# Patient Record
Sex: Female | Born: 1955
Health system: Southern US, Community
[De-identification: ages and names within clinical notes are randomized; demographics above are authoritative.]

## PROBLEM LIST (undated history)

## (undated) DIAGNOSIS — T4145XA Adverse effect of unspecified anesthetic, initial encounter: Secondary | ICD-10-CM

## (undated) DIAGNOSIS — K219 Gastro-esophageal reflux disease without esophagitis: Secondary | ICD-10-CM

## (undated) DIAGNOSIS — L409 Psoriasis, unspecified: Secondary | ICD-10-CM

## (undated) DIAGNOSIS — F329 Major depressive disorder, single episode, unspecified: Secondary | ICD-10-CM

## (undated) DIAGNOSIS — F32A Depression, unspecified: Secondary | ICD-10-CM

## (undated) DIAGNOSIS — IMO0001 Reserved for inherently not codable concepts without codable children: Secondary | ICD-10-CM

## (undated) DIAGNOSIS — K5792 Diverticulitis of intestine, part unspecified, without perforation or abscess without bleeding: Secondary | ICD-10-CM

## (undated) DIAGNOSIS — T8859XA Other complications of anesthesia, initial encounter: Secondary | ICD-10-CM

## (undated) DIAGNOSIS — I10 Essential (primary) hypertension: Secondary | ICD-10-CM

## (undated) DIAGNOSIS — E119 Type 2 diabetes mellitus without complications: Secondary | ICD-10-CM

## (undated) HISTORY — DX: Type 2 diabetes mellitus without complications: E11.9

## (undated) HISTORY — DX: Psoriasis, unspecified: L40.9

## (undated) HISTORY — DX: Major depressive disorder, single episode, unspecified: F32.9

## (undated) HISTORY — PX: COLON SURGERY: SHX602

## (undated) HISTORY — PX: TUBAL LIGATION: SHX77

## (undated) HISTORY — DX: Depression, unspecified: F32.A

---

## 1998-06-12 ENCOUNTER — Ambulatory Visit (HOSPITAL_COMMUNITY): Admission: RE | Admit: 1998-06-12 | Discharge: 1998-06-12 | Payer: Self-pay | Admitting: Obstetrics and Gynecology

## 1999-05-15 ENCOUNTER — Ambulatory Visit (HOSPITAL_COMMUNITY): Admission: RE | Admit: 1999-05-15 | Discharge: 1999-05-15 | Payer: Self-pay | Admitting: Obstetrics and Gynecology

## 1999-05-15 ENCOUNTER — Encounter: Payer: Self-pay | Admitting: Obstetrics and Gynecology

## 1999-05-24 ENCOUNTER — Other Ambulatory Visit: Admission: RE | Admit: 1999-05-24 | Discharge: 1999-05-24 | Payer: Self-pay | Admitting: Obstetrics and Gynecology

## 2000-06-04 ENCOUNTER — Other Ambulatory Visit: Admission: RE | Admit: 2000-06-04 | Discharge: 2000-06-04 | Payer: Self-pay | Admitting: Obstetrics and Gynecology

## 2001-03-30 ENCOUNTER — Encounter: Admission: RE | Admit: 2001-03-30 | Discharge: 2001-03-30 | Payer: Self-pay | Admitting: Family Medicine

## 2001-03-30 ENCOUNTER — Encounter: Payer: Self-pay | Admitting: Family Medicine

## 2001-07-28 ENCOUNTER — Other Ambulatory Visit: Admission: RE | Admit: 2001-07-28 | Discharge: 2001-07-28 | Payer: Self-pay | Admitting: Obstetrics and Gynecology

## 2002-10-05 ENCOUNTER — Other Ambulatory Visit: Admission: RE | Admit: 2002-10-05 | Discharge: 2002-10-05 | Payer: Self-pay | Admitting: *Deleted

## 2003-10-24 ENCOUNTER — Other Ambulatory Visit: Admission: RE | Admit: 2003-10-24 | Discharge: 2003-10-24 | Payer: Self-pay | Admitting: Obstetrics and Gynecology

## 2003-12-08 ENCOUNTER — Encounter: Admission: RE | Admit: 2003-12-08 | Discharge: 2003-12-08 | Payer: Self-pay | Admitting: Emergency Medicine

## 2004-10-30 ENCOUNTER — Other Ambulatory Visit: Admission: RE | Admit: 2004-10-30 | Discharge: 2004-10-30 | Payer: Self-pay | Admitting: Obstetrics and Gynecology

## 2005-04-29 ENCOUNTER — Other Ambulatory Visit: Admission: RE | Admit: 2005-04-29 | Discharge: 2005-04-29 | Payer: Self-pay | Admitting: Obstetrics and Gynecology

## 2005-11-14 ENCOUNTER — Other Ambulatory Visit: Admission: RE | Admit: 2005-11-14 | Discharge: 2005-11-14 | Payer: Self-pay | Admitting: Obstetrics and Gynecology

## 2006-08-27 ENCOUNTER — Encounter: Admission: RE | Admit: 2006-08-27 | Discharge: 2006-08-27 | Payer: Self-pay | Admitting: Emergency Medicine

## 2006-12-12 ENCOUNTER — Encounter: Admission: RE | Admit: 2006-12-12 | Discharge: 2006-12-12 | Payer: Self-pay | Admitting: Emergency Medicine

## 2006-12-17 ENCOUNTER — Encounter: Admission: RE | Admit: 2006-12-17 | Discharge: 2006-12-17 | Payer: Self-pay | Admitting: Emergency Medicine

## 2007-05-12 ENCOUNTER — Encounter: Admission: RE | Admit: 2007-05-12 | Discharge: 2007-05-12 | Payer: Self-pay | Admitting: Gastroenterology

## 2007-11-26 HISTORY — PX: BOWEL RESECTION: SHX1257

## 2008-03-31 ENCOUNTER — Encounter: Admission: RE | Admit: 2008-03-31 | Discharge: 2008-03-31 | Payer: Self-pay | Admitting: Emergency Medicine

## 2008-03-31 ENCOUNTER — Inpatient Hospital Stay (HOSPITAL_COMMUNITY): Admission: EM | Admit: 2008-03-31 | Discharge: 2008-04-05 | Payer: Self-pay | Admitting: Emergency Medicine

## 2008-04-11 ENCOUNTER — Inpatient Hospital Stay (HOSPITAL_COMMUNITY): Admission: EM | Admit: 2008-04-11 | Discharge: 2008-04-28 | Payer: Self-pay | Admitting: Emergency Medicine

## 2008-04-19 ENCOUNTER — Encounter (INDEPENDENT_AMBULATORY_CARE_PROVIDER_SITE_OTHER): Payer: Self-pay | Admitting: General Surgery

## 2010-12-15 ENCOUNTER — Encounter: Payer: Self-pay | Admitting: Emergency Medicine

## 2011-04-09 NOTE — H&P (Signed)
[name], EASTERWOOD NO.:  [id number]   MEDICAL RECORD NO.:  [id number]          PATIENT TYPE:  EMS   LOCATION:  ED                           FACILITY:  [facility]   PHYSICIAN:  Vladimir Faster, MD        DATE OF BIRTH:  1956/07/06   DATE OF ADMISSION:  03/31/2008  DATE OF DISCHARGE:                              HISTORY & PHYSICAL   PRIMARY CARE PHYSICIAN:  Harden Mo, M.D.   CHIEF COMPLAINTS:  Abdominal pain.   Ms. Caitlyn [name] is a 56 year old lady who has a history of hypertension, asthma  and depression who was sent to the ED by her primary care doctor with a  perforated sigmoid diverticulitis.  She states she started having lower  abdominal pain yesterday.  She also experienced some upper abdominal  pain too. The pain was constant in nature.  There is no radiation.  She  describes the pain as a crampy pain. This afternoon she went to her  primary care doctor's office who sent for labs and also did a CT scan of  the abdomen which showed that she had a perforated sigmoid  diverticulitis.  She states since this afternoon she also has some  watery diarrhea.  She also complains of fever and chills since  yesterday. She states she had similar episode approximately 8 months  ago, but at that time the pain went away spontaneously after a couple of  days.  At that time, she did not go to a doctor.  She states now her  abdomen hurts when she moves, but it is localized in her left lower  quadrant.   PAST MEDICAL HISTORY:  1. Asthma.  2. Hypertension.  3. Depression.   PAST SURGICAL HISTORY:  She had tubal ligation the past.  She states her  post surgical period was complicated by respiratory failure and she  remained intubated for 5-6 days.  She apparently had fluid overload and  was unable to be weaned off the ventilator.   ALLERGIES:  She is allergic to ERYTHROMYCIN.   CURRENT MEDICATIONS:  1. Singular 10 mg daily.  2. Venlafaxine 150 mg daily.  3. Fexofenadine 180  mg daily.  4. Naprosyn 500 mg as needed.  5. Advair discus 50/100 1 puff twice daily.  She tells me that she      does not take the Advair regularly.   SOCIAL HISTORY:  She lives at home. Denies any history of smoking,  alcohol or drug abuse.   FAMILY HISTORY:  Her dad is 65.  Her mother is 44, both of them healthy.   REVIEW OF SYSTEMS:  GENERAL:  She denies any recent weight loss or  weight gain.  She does complain of fever and chills since yesterday.  HEENT: No headaches, no blurred vision.  No sore throat.  CARDIOVASCULAR:  Denies chest pain, palpitations.  RESPIRATORY:  Shortness of cough.  GI: She does have abdominal pain, but denies any  nausea or vomiting and she does complain of  diarrhea.  EXTREMITIES:  No  tingling numbness of  toes or fingers.   PHYSICAL EXAMINATION:  She is alert and oriented x3.  VITAL SIGNS: Temperature 97.3, pulse rate of 101 per minute, blood  pressure is 152/88, respiratory rate 16, oxygen saturation is 97% on  room air.  HEENT: Atraumatic, normocephalic.  Pupils bilaterally reactive to light.  Mucous membranes are moist.  NECK:  Supple.  No carotid bruits.  CARDIOVASCULAR: S1, S2 heard. Regular rate and rhythm.  CHEST:  Clear to auscultation.  ABDOMEN:  There is a left lower quadrant tenderness.  There is no  definite rebound tenderness.  Bowel sounds are decreased.   LABORATORY DATA:  Her labs were done as an outpatient which was sent by  her primary care doctor.  This show white count of 137 with 84+  neutrophils, hemoglobin of 12, platelets 274.  Sodium 140, potassium  4.54, chloride 104, bicarb 32, BUN 15, creatinine 0.74, glucose 98.  She  had a CT of the abdomen which shows free intraperitoneal air, sigmoid  diverticulitis with perforation.   IMPRESSION:  1. Perforated sigmoid diverticulitis.  2. Leukocytosis.  3. Sinus tachycardia.  4. History of asthma.   PLAN:  1. Perforated sigmoid diverticulitis.  The ED has contacted the       surgeon on call. Dr. Lucia Gaskins will see her in the ED. At this time,      the plan is for the medical service to admit her and surgery will      follow as a consult.  We will admit her to a step-down bed.  I will      start her on IV fluids and I will start on some narcotics for pain      and will start her empirically on Zosyn.  I will also get blood      cultures.  Dr. Lucia Gaskins will decide about the surgical management,      which I will keep her n.p.o. until she is seen by surgery.  2. She does have a mild leukocytosis and she has mild sinus      tachycardia.  I will also get a 12-lead EKG on her.  3. I will put her on DVT prophylaxis at this time.      Vladimir Faster, MD  Electronically Signed     PKN/MEDQ  D:  03/31/2008  T:  03/31/2008  Job:  [id number]

## 2011-04-09 NOTE — Consult Note (Signed)
Caitlyn [name], Caitlyn [name] NO.:  [id number]   MEDICAL RECORD NO.:  [id number]          PATIENT TYPE:  INP   LOCATION:  0105                         FACILITY:  [facility]   PHYSICIAN:  Fenton Malling. Lucia Gaskins, M.D.  DATE OF BIRTH:  1956/07/09   DATE OF CONSULTATION:  03/31/2008  DATE OF DISCHARGE:                                 CONSULTATION   Date of consultation ???   REFERRING PHYSICIAN:  Harden Mo, M.D.   REASON FOR CONSULTATION:  Diverticular disease.   HISTORY OF PRESENT ILLNESS:  This is a 55 year old white female, who  sees Dr. Philipp Deputy as her primary medical doctor, has been seen by  Dr. Elpidio Anis from [facility] in the [facility] Emergency Room.  She has had some crampy abdominal pain on and off for years.  Her most  recent severe attack was about eight months ago.  She used some  magnesium citrate where this resolved, and she blamed this on irritable  bowel syndrome.   However, yesterday, she started having abdominal pain, which got worse  during the evening.  She tried taking some Advil, which really did not  relieve the pain.  She went to see Dr. Philipp Deputy this morning.  He  obtained a CT scan through  [facility].  The CT scan shows pneumoperitoneum  with evidence of diverticulitis.  She was then sent to the [facility]  Emergency Room for further treatment and evaluation.   She has no history of peptic ulcer disease, liver disease, gallbladder  disease, pancreatic disease, or colon disease.  She underwent a negative  colonoscopy by Dr. Earlie Raveling in May of 2008.  She did not remember him  mentioning anything about diverticular disease.  It is after hours right  now so I cannot speak with Dr. Earlean Shawl.   Her only prior abdominal surgery was a tubal ligation in approximately  1989.   ALLERGIES:  SHE IS ALLERGIC TO ERYTHROMYCIN, which caused stomach upset.   CURRENT MEDICATIONS:  Singulair, Advair Disc, Naprosyn, Allegra.  She  was on Effexor, but  she says her medicine has been switched.  She is  unclear or unsure of her current medication.   REVIEW OF SYSTEMS:  NEUROLOGIC:  She has never had a seizure or loss of  consciousness.  PSYCHIATRIC:  She has had some history with depression and is seeing Dr.  Lynder Parents.  He is the one who has got her on the antidepressants.  PULMONARY:  She has adult asthma, which is pretty much year round.  She  sees Dr. Donneta Romberg for her allergies/asthma, which is under reasonably good  control.  She does not smoke cigarettes.  She has had no pneumonia or  tuberculosis.  CARDIAC:  She had hypertension but successfully lost about 60 pounds and  she says she is now off her blood pressure medicines with her blood  pressure behaving much better.  She has never had heart catheterization,  chest pain, angina.  GASTROINTESTINAL:  See history of present illness.  UROLOGIC:  No evidence of kidney stones or  kidney infections.  GYN:  She  has two boys and again had a tubal ligation in 1989.  EXTREMITIES:  She fell and broke her left arm, which is now healed, but  she is taking some Naprosyn for this left arm pain.   She sees Dr. Janine Ores from a GYN standpoint but has no GYN issues.   Her husband is at her bedside.  She works in Press photographer for [employer].   PHYSICAL EXAMINATION:  VITAL SIGNS:  Her temperature is 97.3, pulse of  107, blood pressure of 152/88, respirations 16.  HEENT:  Unremarkable.  NECK:  Supple.  I felt no masses or thyromegaly.  LUNGS:  Clear to auscultation with symmetric breath sounds.  I hear no  wheezing.  HEART:  Regular rate and rhythm.  The heart is tachycardic, but she has  no murmur and no rub.  ABDOMEN:  She has some mild rebound.  She has active bowel sounds.  Worse in her rebound, some tenderness is worse in the lower part of her  abdomen.  I did not do a rectal exam on her.  EXTREMITIES:  She has good strength in the upper and lower extremities,  though she has a  sore left upper arm.  NEUROLOGICAL:  She is grossly intact.   Labs that I have were drawn through [employer] are dated today, Mar 31, 2008, a white blood count of 13,700 with 86% neutrophils, a hemoglobin  of 12, hematocrit of 34.  Her glucose is 98, BUN is 15, creatinine of  0.74, sodium is 142, potassium 4.5.  Her total bilirubin is high at 1.5,  her alk-phos is 43, her amylase is 31, lipase 9.   I reviewed her CT with Misty Stanley, which shows what appears to be  diverticular changes of her sigmoid colon and free air.  She actually  has surprisingly little contamination, no evidence of abscess, and no  other cause for pneumoperitoneum.   DIAGNOSES:  1. Probable sigmoid diverticulitis with focal perforation.  Agree with      current plans of intravenous fluids, NPO, intravenous antibiotics,      and hopefully this will resolve with this medical course.  I have talked with her and her husband that as this gets better then we  have to decide on an elective basis does she need an elective sigmoid  colectomy and I think this will depend on her course.  If she does not  get better over the next 48 to 72 hours, I told her there is a chance  she would require more urgent surgery and would almost certainly need a  temporary colostomy if this were to happen.  I think they understand  both the risks and benefits of both waiting and more urgent surgery.  1. Asthma, which right now is pretty well controlled.  2. Depression.  3. History of weight loss of 60 pounds, though she is still moderately      obese.  4. History of hypertension, though off medicines at this current time.  5. History of fracture of left arm, which is still sore but seems to      be getting better.      Fenton Malling. Lucia Gaskins, M.D.  Electronically Signed     DHN/MEDQ  D:  03/31/2008  T:  03/31/2008  Job:  [id number]   cc:   Harden Mo, M.D.  Fax: [phone]   Vladimir Faster, MD  Email: [email or web address]  Richard  M. Matthew Saras, M.D.  Fax: [phone]   Mayme Genta, M.D.  Fax: [phone]   Dondra Spry, M.D.  Fax: [phone]

## 2011-04-09 NOTE — H&P (Signed)
NAMETASHAWN, GREFF NO.:  [id number]   MEDICAL RECORD NO.:  [id number]          PATIENT TYPE:  INP   LOCATION:  1536                         FACILITY:  [facility]   PHYSICIAN:  Orson Ape. Weatherly, M.D.DATE OF BIRTH:  06/26/1956   DATE OF ADMISSION:  04/11/2008  DATE OF DISCHARGE:                              HISTORY & PHYSICAL   CHIEF COMPLAINT:  Increasing lower abdominal pain.   HISTORY:  The patient is a 55 year old female patient of Harden Mo, M.D., who was admitted here under the [facility] on Mar 31, 2008, with lower abdominal pain after a CT had been obtained by Dr.  Jake Michaelis that showed a perforated sigmoid diverticulitis.  She, at that  time, had started having pain about 24 hours before and was admitted and  placed on Zosyn.  She was seen in surgical consultation by Fenton Malling.  Lucia Gaskins, M.D. on May 7, and clinically improved significantly over a  period of about 48 hours and was discharged on Tuesday on oral  amoxicillin.  She said over the next 4-5 days she started having pain  and fever over the weekend, but not that severe.  Today she returned to  see Dr. Jake Michaelis and on examination said she was about the same as she was  when he saw her on May 7, and he called and I suggested that she be sent  back to the ER at [facility].  On examination in the  emergency room, she was definitely not generalized tender.  She was  tender in the lower abdomen and on the rectal examination there was  stool within her rectum.  I could not feel anything on rectal  examination that feels like a definite abscess.  I reviewed the x-rays  from 10 days ago with the radiologist and we elected to get an abdominal  series and this did not show any evidence of any free air, kind of a  nonspecific bowel gas pattern.  Then a CT was ordered.  It was not keyed  in when she was in the emergency room and when I returned about 3 hours  later to review the CT,  it was discovered that it had not been ordered.  She has now had the oral contrast and she has just completed the CT at  about midnight.  The CT at this time now shows that there is about an  orange-size abscess that is kind of anterior to the bladder.  There is  this segment of sigmoid diverticulitis, but there is no evidence of this  diffuse free air that was present on the CT on May 7.  I reviewed the x-  ray with the radiologist and he feels that this is amenable to a  percutaneous drainage and I think that what we will do is reassess her  in the morning and repeat her white count.  She has been started on  Zosyn and Flagyl and if she is clinically improving, we will proceed  with percutaneous drainage tomorrow.  If she is having  basically  deterioration in her clinical course or markedly increased white count,  etc., then the patient is aware that we possibly will need to proceed  with a laparotomy, drainage, sigmoid colectomy, and end colostomy and  then reestablish bowel continuity approximately 4 months later.  The  patient is hopeful that she is not going to need a colostomy but it was  explained to her when she was in the hospital previously and she is more  receptive than I think she was during her admission on May 7.   PAST MEDICAL HISTORY:  Well documented.  Previously she has had a  history of depression and has been restarted on the medications for  this.  Her psychiatrist is Lynder Parents, M.D. and her other past history  is that she has had a history of asthma for which she is on inhalers.  She is not wheezing at this time.  Dondra Spry, M.D. is her  allergy/asthma physician and she appears to be under good control.  She  has had a colonoscopy with Mayme Genta, M.D. approximately in 2008  and no mention was made to her about diverticulitis at the time of that  colonoscopy.   CHRONIC MEDICATIONS:  1. Singulair 10 mg daily.  2. Advair Diskus twice daily.  3.  Naprosyn.  4. Allegra.  5. Venlafaxine.   The patient has had a tubal ligation in 1989 and she has had fracture of  her left arm in the past.  Ralene Bathe. Matthew Saras, M.D. is her gynecologist.  Please refer to the detailed recent physical examination for any  details.   PHYSICAL EXAMINATION:  VITAL SIGNS:  Today in the emergency room her  temperature was 99.4, later it was 99.7, pulse originally was 120 but  after IV started it was 84, respirations 20, blood pressure 136/78.  HEENT:  Unremarkable.  LUNGS:  Clear.  She is not wheezing.  I did not do a breast examination.  HEART:  Normal sinus rhythm.  ABDOMEN:  She is certainly not acutely tender.  She has a pressure sore  in the mid lower pelvis area.  RECTAL:  There was some solid stool in her rectum, but I could not feel  anything like an actual abscess and movement of the cervix was not that  uncomfortable in the emergency room on rectal examination.   LABORATORY DATA:  Today in the ER her white count now is 18,300 with a  left shift, 89% neutrophils.  Her serum electrolytes are normal with BUN  10, glucose 97.  Liver function tests are normal.   She has been started on Zosyn and Flagyl and lab studies of CBC and coag  studies were ordered for in the morning.  Review this with the  interventional radiologist in the morning providing she is not having  any signs of generalized peritonitis.   ADMISSION DIAGNOSES:  1. Pelvic abscess secondary to perforated sigmoid diverticulitis.  2. History of mild hypertension.  3. History of asthma.           ______________________________  Orson Ape. Rise Patience, M.D.     WJW/MEDQ  D:  04/12/2008  T:  04/12/2008  Job:  [id number]

## 2011-04-09 NOTE — Discharge Summary (Signed)
NAMECAYLIN, Caitlyn [name]                   ACCOUNT NO.:  [id number]   MEDICAL RECORD NO.:  [id number]          PATIENT TYPE:  INP   LOCATION:  35                         [facility]ACILITY:  [facility]   PHYSICIAN:  Mobolaji B. Bakare, M.D.DATE OF BIRTH:  Dec 20, 1955   DATE OF ADMISSION:  03/31/2008  DATE OF DISCHARGE:  04/05/2008                               DISCHARGE SUMMARY   PRIMARY CARE PHYSICIAN:  Harden Mo, M.D.   CONSULTATION:  General surgery consult provided by Dr. Alphonsa Overall.   FINAL DIAGNOSES:  1. Sigmoid diverticulitis with perforation.  2. Normocytic anemia.  3. Hypertension.  4. History of asthma, quiescent during this hospitalization.   PROCEDURE:  CT scan of abdomen and pelvis done on Mar 31, 2008 showed  sigmoid diverticulitis with perforation.  No overt abscess formation is  seen.  Mild secondary small bowel inflammatory changes identified  without evidence for obstruction.  Consider evaluation for possible  underlying mass lesion after resolution of acute episode.   BRIEF HISTORY:  Please refer to the admission H&P dictated by Dr. Carlton Adam.  In brief, Caitlyn [name] is a 55 year old Caucasian female with history of  hypertension, asthma, and depression.  She developed abdominal pain the  day prior to hospitalization.  She went to see Dr. Jake Michaelis in the office.  Given the nature of this pain, he requested for urgent CT scan of the  abdomen and pelvis as an outpatient.  Result confirmed sigmoid  diverticulitis with perforation without any abscess.  She had mild  leukocytosis and tachycardia.  The patient was admitted to medical  service after discussion with Dr. Lucia Gaskins (surgeon).  He opted for  conservative treatment given the fact that there is no abscess at this  time.   Upon admission, temperature was 97.3, heart rate of 101.  She was  started on IV Zosyn and admitted to in the intensive care unit and  monitored closely.   HOSPITAL COURSE:  Problem 1.  Acute sigmoid  diverticulitis with  perforation.  The patient responded to n.p.o., IV fluids, IV  antibiotics, and bowel rest.  The bowel was rested for about 4 days.  The patient improved remarkably well, and she remained afebrile, white  cell count normalized, and the abdomen became less tender.  She was  therefore started on clear liquids which she tolerated quite well.  Abdominal pain and tenderness has completely resolved.  She has  completed 5 days of IV antibiotics in the hospital.  She will continue  with p.o. Augmentin as outpatient for 10 more days.  The patient will  follow up with Dr. Lucia Gaskins in 2-3 weeks.  She has been counseled on diet.  Low residue diet instruction has been given to the patient.   Problem 2.  Hypertension.  She has history of hypertension but was taken  off antihypertensive because her blood pressure was normal and actually  borderline after she lost about 60 pounds.  However, the patient  contends that the blood pressure has been climbing up recently.  During  the course of hospitalization, her blood pressure ranged between 150-170  systolic.  Therefore, we had to start her on clonidine patch while  n.p.o.  Upon discharge, she was given Diovan 80 mg daily, further  adjustment to be made by Dr. Jake Michaelis on outpatient basis.  The patient  was previously on Diovan.   DISCHARGE MEDICATIONS:  1. Augmentin 500 mg p.o. b.i.d. for 10 more days.  2. Singulair 10 mg daily.  3. Venlafaxine 150 mg daily.  4. Fexofenadine 180 mg p.o. daily.  5. Advair 50/100 one b.i.d.  6. Diovan 80 mg daily.  7. Nu-Iron 150 mg p.o. daily.  8. Naproxen has been discontinued.   DIET:  The patient is currently on a clear liquid diet.  She should  start low residue diet tomorrow.   DISCHARGE INSTRUCTIONS:  1. Follow up [facility] in 2 weeks (ARB started).  2. Follow up with Dr. Jake Michaelis in a 5 days.  3. Follow up with Dr. Lucia Gaskins in 2-3 weeks.  4. The patient to have colonoscopy done as an outpatient  when acute      inflammation resolves.   DISCHARGE LABORATORY DATA:  Blood culture with no growth.  White cell  5.8, hemoglobin 9.9, hematocrit 29.2, platelets 179.  Anemia panel  showed iron less than 10, vitamin B12 379, folate 12.7, ferritin 463,  TIBC and percent saturation not calculated.      Mobolaji B. Maia Petties, M.D.  Electronically Signed     MBB/MEDQ  D:  04/05/2008  T:  04/05/2008  Job:  [id number]

## 2011-04-09 NOTE — Op Note (Signed)
[name], [name] NO.:  [id number]   MEDICAL RECORD NO.:  [id number]          PATIENT TYPE:  INP   LOCATION:  1536                         FACILITY:  [facility]   PHYSICIAN:  Orson Ape. Weatherly, M.D.DATE OF BIRTH:  Nov 11, 1956   DATE OF PROCEDURE:  04/19/2008  DATE OF DISCHARGE:                               OPERATIVE REPORT   PREOPERATIVE DIAGNOSES:  Sigmoid diverticulitis with abscess drained 1  week ago.   OPERATION:  Exploratory laparotomy, resection of sigmoid colon, end-to-  end anastomosis, extensive lysis of adhesions.   ANESTHESIA:  General.   SURGEON:  Orson Ape. Rise Patience, M.D.   ASSISTANT:  Dr. Johnathan Hausen.   HISTORY:  Caitlyn [name] is a 55 year old Caucasian female who approximately  2-1/2 weeks ago was admitted on the [facility] service after being seen  by Dr. Philipp Deputy, her regular physician, with diverticulitis.  She  had an elevated white count.  The original CT showed a small amount of  free air.  She was started on antibiotics, did very nicely and then her  white count was elevated, dropped down to normal.  Dr. Lucia Gaskins was seeing  her in consultation and the possibility of emergency surgery was  entertained but she did so nicely it was felt not to be necessary.  She  got out of the hospital in about 4 days on oral antibiotics but when she  got home she started having fever, similar-type pains, and then was seen  again  by Dr. Philipp Deputy on Monday.  He said she had the same physical  findings and I was called and admitted her here at [facility].  Dr.  Lucia Gaskins was on vacation that day.  Her repeat CT showed obviously an  abscess anterior to the bladder.  You could see a segment of 8 inches or  so was sigmoid colon that was markedly sigmoid diverticulitis and  fortunately at that one we did not see any free air like had been seen  on the CT approximately 10 days earlier.  I  placed her on Zosyn and  Flagyl, talked with the radiologist  about doing a percutaneous drainage  of the abscess the following morning, and the patient was in agreement.  Dr. Lucia Gaskins did see her the following day, was in agreement with this and  we placed the percutaneous drain, got frank purulence.  It grows  multiple organisms all sensitive to the predominant  E. coli organism.  Clinically she had gotten better.  We have kept her n.p.o.  Then after  about 2 days, with her definitely improving, we started her on a liquid  diet, which she has tolerated and continued to have kind of more amount  of drainage than usual but not actually intestinal contents obviously.  Clinically she was improving such that we were thinking that we would do  the percutaneous drainage and get the catheter out, but with her  history, it was felt that she was definitely going to need a sigmoid  colectomy.  I obtained a CT on Saturday and her white count is  normal  now.  There is no evidence of any other abscesses.  There is a little  collection around the catheter but we did not see any incontinuity of  the bowel.  We arranged for a sinus tract injection the following  morning, which was done on Sunday, and this shows a definite  communication with the sigmoid colon.  Fortunately, it appears that she  has got a pretty good bowel prep and I recommended that we go ahead and  proceed on with a sigmoid colectomy.  Hopefully, we can do an  anastomosis now that she is prepped and the patient and her family are  in agreement with this.  Dr. Lucia Gaskins is also in agreement and today his  schedule did not permit him to assist but Dr. Hassell Done assisted and Dr.  Lucia Gaskins did drop in and out of the OR during the procedure.   DESCRIPTION OF PROCEDURE:  The patient was taken down.  The first thing  we did after induction anesthesia was put a Foley catheter within the  anus that was hooked up to a bulb of a sigmoid scope and inflated so  after we do the anastomosis, we could test it.  Next, the  Foley catheter  was inserted.  She had PAS stockings.  The abdomen was prepped with  Betadine solution and draped in a sterile manner.  A midline incision  was made below the umbilicus.  She is fairly moderately overweight and  down through the adipose tissue and then the fascia opened in the  midline and carefully opened into the peritoneal cavity.  You could feel  this inflammatory mass anterior to the bladder but there was not a  generalized peritonitis and fortunately there was a very good prep of  her colon.  The omentum was adherent to the area and this was kind of  carefully separated and then there was several loops of small bowel that  sort of made up a portion of the abscess cavity.  The bladder made the  most inferior portion and then the sigmoid colon, this was the area of  the kind of the loop of the sigmoid colon that was probably about 8 or 9  inches in length and then the proximal and distal colon looked good.  Fortunately, we able to divide the mesentery close to the bowel and did  not dissect out the left ureter, and then picked the area for the  anastomosis right below the descending colon and the little bit of the  distal sigmoid rectal junction, and this came together very nicely.  I  did a single layer of 3-0 silk knots inverted anastomosis and it came  together without excessive tension and it is a good lumen by finger  test.  Next, the little mesenteric defect were closed with 3-0 silk and  then we inflated the balloon that was in her rectum and the nurse  inflated the rectum and under water there was no evidence of any air  leak.  The colon distends up nicely.  We then deflated the area and kind  of freed up the little adhesions to the small bowel and there were  several little areas that were kind of within the abscess wall that I  carefully inspected and could not see any communication to truly of the  small bowel itself.  These areas, of course, now not located  in the  pelvis and I did a few 3-0 silk serosa-to-serosa if there was  any kind  of little serosal injury.  The omentum was brought down over this.  Her  gallbladder was distended but it was  not acutely inflamed, had a good  color, and I think this is just basically related to not eating for  approximately 2 weeks, and we had not noticed any stones on her CAT  scans.  Also, the gallbladder was way up and it would not be possible to  approach it from this incision without extending the incision about 6  inches.  Dr. Lucia Gaskins was there when we were inspecting the gallbladder  and we elected just to not worry about the gallbladder at this time and  I certainly could not palpate any stones.  The fascia, the lower  peritoneum was closed with a running 0 Vicryl, the anterior fascia and  full-thickness fascia with interrupted sutures of #1 Novofil.  I did  place a 19 Blake drain in the pelvis.  It is located really behind the  bladder, sort of below the anastomosis.  The small bowel was in  anatomical position and the fascia sutures were all closed.  I then  basically placed 3-0 nylon sutures in the skin but packed the wound  itself with Betadine gauze 4 x 4s and will do a delayed closure of the  skin probably in about 4-5 days since I think if we would close the skin  in a kind of an obese patient, that we will have a definite wound  infection.  Sponge and needle counts were correct.  Estimated blood loss  was probably 75 mL or so.  She has an NG tube that was positioned nicely  in her stomach and we will keep her n.p.o. IVs.  I am planning to start  her on TPN tomorrow since she has been about 2 weeks since she has a  PICC line in her arm at this time.           ______________________________  Orson Ape. Rise Patience, M.D.     WJW/MEDQ  D:  04/19/2008  T:  04/19/2008  Job:  [id number]   cc:   Harden Mo, M.D.  Fax: [phone]

## 2011-04-12 NOTE — Discharge Summary (Signed)
Caitlyn [name], Caitlyn [name] NO.:  [id number]   MEDICAL RECORD NO.:  [id number]          PATIENT TYPE:  INP   LOCATION:  1536                         FACILITY:  [facility]   PHYSICIAN:  Orson Ape. Weatherly, M.D.DATE OF BIRTH:  [birth year]   DATE OF ADMISSION:  04/11/2008  DATE OF DISCHARGE:  04/28/2008                               DISCHARGE SUMMARY   DISCHARGE DIAGNOSES:  Sigmoid diverticulitis with pelvic abscess.   OPERATION:  Percutaneous drainage of pelvic abscess, and later sigmoid  resection with primary anastomosis.  We also did a delayed closure of  the patient's wound.   HISTORY:  Caitlyn [name] is a 55 year old Caucasian female, patient of Dr.  Nicholes Mango who was admitted here by the [facility] service on Mar 31, 2008, after she presented with lower abdominal pain in Dr. Viann Shove  office and had a CAT scan that showed a perforated sigmoid  diverticulitis.  She had started having pain approximately 24 hours  prior to that and was admitted, placed on Zosyn and was seen in surgical  consultation by Dr. Lucia Gaskins on Mar 31, 2008.  She significantly improved  over 24 hours with IV antibiotics and was discharged on oral antibiotic  amoxicillin, and said over the next 4-5 days she started having pain,  fever and had similar symptoms to what she had when she was admitted on  the first time.  She saw Dr. Jake Michaelis on Monday morning in his office; he  called me, I was on-call at [facility], and said her symptoms were  basically the same and I readmitted her.  We repeated the CT.  On  physical examination she was tender, but not a generalized tenderness.  On rectal exam, I could not feel any obvious abscess.  On the repeat CT,  there was definitely an abscess with very minimal air, well localized.  The patient has a past history of depression and has been on medication.  Her physician is Dr. Lynder Parents.  She also has a history of asthma for  which she is on Singulair and Advair  inhaler b.i.d. Since Dr. Lucia Gaskins had  seen her previously, I had him also to see her, and we thought that even  though the abscess was fairly large that definitely a percutaneous  drainage was indicated.  This was done the following day with marked  purulence that showed multiple organisms.  And clinically with the  percutaneous drainage, her pain decreased and her white count that was  elevated initially at 18,300 dropped down to 4800, 48 hours later.  She  was started on a liquid diet.  At first, there was no obvious GI  contents in the abscess and she still had left lower quadrant pain, but  better.  Her temperature came down to normal, and then a follow-up CT  was scheduled.  She started having loose bowel movements and we had  followup CT fistulous tract, and this shows a definite communication  with the abscess to the colon.  Therefore, we had a patient that was  clinically improved, but obvious communication  who had been in the  hospital one time previously and with the amount of contrast going into  the abscess cavity, I talked with Dr. Lucia Gaskins, and we decided it would be  best to go ahead and try to prep her and hopefully be able to do a  primary anastomosis, but the patient was aware that she may need a  sigmoid colon resection and colostomy.  Therefore, she was scheduled and  taken to surgery.  The bowel had been prepped prior to surgery and Dr.  Hassell Done assisted on her surgery, which was done on Apr 19, 2008, and we  removed the markedly inflamed segment of sigmoid colon and did a primary  anastomosis.  I put a drain and I left the wound open in which we did a  delayed closure about 5 days later and kept her on continuous  antibiotics.  Postoperatively, she did nicely.  She had a nasogastric  tube, but started having bowel functions approximately 4 days after her  surgery.  We had started her on TPN preoperatively, which we continued.  Dr. Harlow Asa closed the sutures that I had  placed at the time of surgery  for delayed closure.  She did nicely and clinically improved when her  p.o. psychiatric medications were restarted.  She was ready for  discharged in improved condition without additional antibiotics on April 28, 2008.  She will be followed in our office for a wound check in  approximately 1 week, and hopefully will have no problems with  postoperative infection.  She was covered with Lovenox for  thrombophlebitis prevention during the hospitalization and her PICC line  was removed prior to her  release.  The patient has a few Vicodin if needed for pain medication,  but required very little pain medication at the time of her discharge.  She will continue on her usual pulmonary and asthma medication,  psychiatric medication, and will use MiraLax if needed for laxative  stimulus.           ______________________________  Orson Ape. Rise Patience, M.D.     WJW/MEDQ  D:  05/23/2008  T:  05/23/2008  Job:  [id number]   cc:   Harden Mo, M.D.  Fax: [phone]

## 2011-08-21 LAB — COMPREHENSIVE METABOLIC PANEL
ALT: 18
ALT: 56 — ABNORMAL HIGH
AST: 21
Albumin: 3.1 — ABNORMAL LOW
Alkaline Phosphatase: 45
BUN: 10
BUN: 10
CO2: 28
Calcium: 8.2 — ABNORMAL LOW
Calcium: 9.2
Chloride: 101
Creatinine, Ser: 0.77
GFR calc Af Amer: >60
GFR calc non Af Amer: >60
GFR calc non Af Amer: >60
Glucose, Bld: 126 — ABNORMAL HIGH
Total Bilirubin: 1.2
Total Protein: 5.6 — ABNORMAL LOW

## 2011-08-21 LAB — CLOSTRIDIUM DIFFICILE EIA

## 2011-08-21 LAB — BASIC METABOLIC PANEL
BUN: 8
BUN: 9
CO2: 30
CO2: 30
CO2: 31
Calcium: 8.4
Calcium: 8.5
Calcium: 8.6
Calcium: 8.6
Calcium: 8.7
Chloride: 102
Chloride: 104
Creatinine, Ser: 0.73
Creatinine, Ser: 0.78
GFR calc Af Amer: 51 — ABNORMAL LOW
GFR calc Af Amer: >60
GFR calc Af Amer: >60
GFR calc Af Amer: >60
GFR calc non Af Amer: 42 — ABNORMAL LOW
GFR calc non Af Amer: >60
GFR calc non Af Amer: >60
Glucose, Bld: 119 — ABNORMAL HIGH
Glucose, Bld: 167 — ABNORMAL HIGH
Potassium: 4.7
Sodium: 137
Sodium: 138
Sodium: 140

## 2011-08-21 LAB — CBC
HCT: 29.6 — ABNORMAL LOW
HCT: 31.2 — ABNORMAL LOW
HCT: 32.2 — ABNORMAL LOW
HCT: 34.1 — ABNORMAL LOW
HCT: 34.7 — ABNORMAL LOW
Hemoglobin: 10 — ABNORMAL LOW
Hemoglobin: 10.1 — ABNORMAL LOW
Hemoglobin: 10.6 — ABNORMAL LOW
Hemoglobin: 11.8 — ABNORMAL LOW
MCHC: 33
MCHC: 33.9
MCHC: 33.9
MCHC: 34
MCHC: 34.2
MCHC: 34.4
MCV: 87.7
MCV: 88.4
MCV: 88.8
Platelets: 340
Platelets: 345
Platelets: 376
RBC: 3.36 — ABNORMAL LOW
RBC: 3.69 — ABNORMAL LOW
RBC: 3.9
RBC: 3.92
RDW: 12.1
RDW: 12.8
RDW: 13.1
RDW: 13.4
RDW: 13.8
WBC: 15.3 — ABNORMAL HIGH
WBC: 18.3 — ABNORMAL HIGH

## 2011-08-21 LAB — DIFFERENTIAL
Basophils Absolute: 0
Basophils Relative: 0
Eosinophils Absolute: 0.5
Eosinophils Relative: 0
Lymphs Abs: 1.6
Monocytes Absolute: 0.8
Monocytes Relative: 7
Neutro Abs: 16.2 — ABNORMAL HIGH
Neutro Abs: 8.5 — ABNORMAL HIGH
Neutrophils Relative %: 74
Neutrophils Relative %: 89 — ABNORMAL HIGH

## 2011-08-21 LAB — PROTIME-INR
INR: 1.2
Prothrombin Time: 15.2

## 2011-08-21 LAB — CULTURE, ROUTINE-ABSCESS

## 2011-08-21 LAB — APTT: aPTT: 37

## 2011-08-21 LAB — ANAEROBIC CULTURE

## 2011-08-21 LAB — ABO/RH: ABO/RH(D): O NEG

## 2011-08-21 LAB — PHOSPHORUS: Phosphorus: 2.8

## 2011-08-21 LAB — MAGNESIUM: Magnesium: 2.1

## 2011-08-21 LAB — TYPE AND SCREEN: Antibody Screen: NEGATIVE

## 2011-08-21 LAB — PREALBUMIN: Prealbumin: 14.2 — ABNORMAL LOW

## 2011-08-22 LAB — BASIC METABOLIC PANEL
BUN: 25 — ABNORMAL HIGH
CO2: 26
Calcium: 8.6
Creatinine, Ser: 0.78
Creatinine, Ser: 0.9
GFR calc Af Amer: >60
GFR calc non Af Amer: >60

## 2011-08-22 LAB — PREALBUMIN: Prealbumin: 17.6 — ABNORMAL LOW

## 2011-08-22 LAB — CBC
Hemoglobin: 9.4 — ABNORMAL LOW
MCHC: 34.3
MCHC: 34.5
Platelets: 354
RBC: 3.12 — ABNORMAL LOW
RBC: 3.28 — ABNORMAL LOW
WBC: 6.3
WBC: 6.3

## 2011-08-22 LAB — COMPREHENSIVE METABOLIC PANEL
ALT: 13
AST: 15
Alkaline Phosphatase: 48
CO2: 30
Calcium: 8.8
GFR calc Af Amer: >60
GFR calc non Af Amer: >60
Glucose, Bld: 131 — ABNORMAL HIGH
Potassium: 4.2
Sodium: 139

## 2011-08-22 LAB — PHOSPHORUS: Phosphorus: 4.2

## 2011-08-22 LAB — DIFFERENTIAL
Basophils Relative: 1
Eosinophils Absolute: 0.3
Eosinophils Relative: 5
Lymphs Abs: 1.1
Monocytes Relative: 7

## 2011-08-22 LAB — MAGNESIUM: Magnesium: 2.1

## 2011-08-22 LAB — CHOLESTEROL, TOTAL: Cholesterol: 98

## 2011-08-22 LAB — TRIGLYCERIDES: Triglycerides: 103

## 2011-10-07 NOTE — H&P (Signed)
Caitlyn [name] [facility]  DICTATION # [id number] CSN# [id number]   Margarette Asal, MD 10/07/2011 9:52 AM

## 2011-10-08 ENCOUNTER — Encounter (HOSPITAL_BASED_OUTPATIENT_CLINIC_OR_DEPARTMENT_OTHER): Payer: Self-pay | Admitting: *Deleted

## 2011-10-08 NOTE — H&P (Signed)
Caitlyn [name], Caitlyn [name] NO.:  [id number]  MEDICAL RECORD NO.:  [id number]  LOCATION:                               FACILITY:  [facility]  PHYSICIAN:  Ralene Bathe. Matthew Saras, M.D.DATE OF BIRTH:  24-Mar-1956  DATE OF ADMISSION:  10/09/2011 DATE OF DISCHARGE:                             HISTORY & PHYSICAL   Outpatient surgery scheduled at [place]:  Postmenopausal bleeding.  HISTORY OF PRESENT ILLNESS:  A 55 year old postmenopausal patient who is noted to have some abnormal bleeding, SHG was carried out in our office dated September 24, 2011, that showed a retroverted uterus with a well- defined 2.3 x 7 mm polyp; presents now for D and C, hysteroscopy.  This procedure including risks related to bleeding, infection, and other complications such as perforation that may require additional surgery, all discussed with her, which she understands and accepts.  PAST MEDICAL HISTORY:  Last Pap smear dated August 12, was normal.  ALLERGIES:  ERYTHROMYCIN.  CURRENT MEDICATIONS: 1. Effexor. 2. Singulair.  SURGICAL HISTORY:  Two vaginal deliveries, tubal ligation, and partial colon resection in 2009.  REVIEW OF SYSTEMS:  Significant for history of asthma.  SOCIAL HISTORY:  Denies alcohol, tobacco, or drug use.  She is married. Dr. Dennard Schaumann in [place] is her PCP.  PHYSICAL EXAMINATION:  VITAL SIGNS:  Temperature 98.2, blood pressure 138/90. HEENT:  Unremarkable. NECK:  Supple without masses. LUNGS:  Clear. CARDIOVASCULAR:  Regular rate and rhythm without murmurs, rubs, or gallops. BREASTS:  Without masses. ABDOMEN:  Soft, flat, nontender. PELVIC EXAM:  Normal external genitalia.  Vagina and cervix clear. Uterus mid position, normal size.  Adnexa negative. EXTREMITIES:  Unremarkable. NEUROLOGIC EXAM:  Unremarkable.  IMPRESSION:  Postmenopausal bleeding, endometrial polyp.  PLAN:  D and C, hysteroscopy.  Procedure and risks reviewed as  above.     Giomar Gusler M. Matthew Saras, M.D.     RMH/MEDQ  D:  10/07/2011  T:  10/07/2011  Job:  [id number]

## 2011-10-08 NOTE — Progress Notes (Signed)
NPO after MN. Pt to arrive at [facility]. Needs istat and ekg and urine preg per dr Matthew Saras order. Will do advair inhaler am of surg. W/ sip of water. Will bring rescue inhaler.

## 2011-10-09 ENCOUNTER — Other Ambulatory Visit: Payer: Self-pay

## 2011-10-09 ENCOUNTER — Encounter (HOSPITAL_BASED_OUTPATIENT_CLINIC_OR_DEPARTMENT_OTHER): Payer: Self-pay | Admitting: Anesthesiology

## 2011-10-09 ENCOUNTER — Other Ambulatory Visit: Payer: Self-pay | Admitting: Obstetrics and Gynecology

## 2011-10-09 ENCOUNTER — Ambulatory Visit (HOSPITAL_BASED_OUTPATIENT_CLINIC_OR_DEPARTMENT_OTHER)
Admission: RE | Admit: 2011-10-09 | Discharge: 2011-10-09 | Disposition: A | Discharge status: Home / Self Care | Payer: 59 | Source: Ambulatory Visit | Attending: Obstetrics and Gynecology | Admitting: Obstetrics and Gynecology

## 2011-10-09 ENCOUNTER — Ambulatory Visit (HOSPITAL_BASED_OUTPATIENT_CLINIC_OR_DEPARTMENT_OTHER): Payer: 59 | Admitting: Anesthesiology

## 2011-10-09 ENCOUNTER — Encounter (HOSPITAL_BASED_OUTPATIENT_CLINIC_OR_DEPARTMENT_OTHER)
Admission: RE | Disposition: A | Discharge status: Home / Self Care | Payer: Self-pay | Source: Ambulatory Visit | Attending: Obstetrics and Gynecology

## 2011-10-09 DIAGNOSIS — N840 Polyp of corpus uteri: Secondary | ICD-10-CM | POA: Insufficient documentation

## 2011-10-09 DIAGNOSIS — N938 Other specified abnormal uterine and vaginal bleeding: Secondary | ICD-10-CM | POA: Insufficient documentation

## 2011-10-09 DIAGNOSIS — N949 Unspecified condition associated with female genital organs and menstrual cycle: Secondary | ICD-10-CM | POA: Insufficient documentation

## 2011-10-09 HISTORY — DX: Other complications of anesthesia, initial encounter: T88.59XA

## 2011-10-09 HISTORY — PX: HYSTEROSCOPY: SHX211

## 2011-10-09 HISTORY — DX: Adverse effect of unspecified anesthetic, initial encounter: T41.45XA

## 2011-10-09 HISTORY — DX: Gastro-esophageal reflux disease without esophagitis: K21.9

## 2011-10-09 HISTORY — DX: Diverticulitis of intestine, part unspecified, without perforation or abscess without bleeding: K57.92

## 2011-10-09 HISTORY — DX: Reserved for inherently not codable concepts without codable children: IMO0001

## 2011-10-09 HISTORY — DX: Essential (primary) hypertension: I10

## 2011-10-09 LAB — POCT I-STAT 4, (NA,K, GLUC, HGB,HCT)
Glucose, Bld: 146 mg/dL — ABNORMAL HIGH (ref 70–99)
HCT: 43 % (ref 36.0–46.0)
Hemoglobin: 14.6 g/dL (ref 12.0–15.0)
Potassium: 4.1 mEq/L (ref 3.5–5.1)
Sodium: 143 mEq/L (ref 135–145)

## 2011-10-09 SURGERY — HYSTEROSCOPY
Anesthesia: General | Site: Uterus | Wound class: Clean Contaminated

## 2011-10-09 MED ORDER — LIDOCAINE HCL 1 % IJ SOLN
INTRAMUSCULAR | Status: DC | PRN
  Administered 2011-10-09: 7 mL

## 2011-10-09 MED ORDER — MIDAZOLAM HCL 5 MG/5ML IJ SOLN
INTRAMUSCULAR | Status: DC | PRN
  Administered 2011-10-09: 2 mg via INTRAVENOUS

## 2011-10-09 MED ORDER — HYDROCODONE-ACETAMINOPHEN 5-325 MG PO TABS
1.0000 | ORAL_TABLET | Freq: Once | ORAL | Status: AC
  Administered 2011-10-09: 1 via ORAL

## 2011-10-09 MED ORDER — PROPOFOL 10 MG/ML IV EMUL
INTRAVENOUS | Status: DC | PRN
  Administered 2011-10-09: 200 mg via INTRAVENOUS

## 2011-10-09 MED ORDER — LIDOCAINE HCL (CARDIAC) 20 MG/ML IV SOLN
INTRAVENOUS | Status: DC | PRN
  Administered 2011-10-09: 40 mg via INTRAVENOUS
  Administered 2011-10-09: 60 mg via INTRAVENOUS

## 2011-10-09 MED ORDER — LACTATED RINGERS IV SOLN: INTRAVENOUS | Status: DC

## 2011-10-09 MED ORDER — FENTANYL CITRATE 0.05 MG/ML IJ SOLN
25.0000 ug | INTRAMUSCULAR | Status: DC | PRN
  Administered 2011-10-09: 25 ug via INTRAVENOUS

## 2011-10-09 MED ORDER — LACTATED RINGERS IV SOLN
INTRAVENOUS | Status: DC
  Administered 2011-10-09: 08:00:00 via INTRAVENOUS

## 2011-10-09 MED ORDER — FENTANYL CITRATE 0.05 MG/ML IJ SOLN
INTRAMUSCULAR | Status: DC | PRN
  Administered 2011-10-09 (×2): 50 ug via INTRAVENOUS

## 2011-10-09 MED ORDER — CEFAZOLIN SODIUM 1-5 GM-% IV SOLN
1.0000 g | INTRAVENOUS | Status: AC
  Administered 2011-10-09: 2 g via INTRAVENOUS

## 2011-10-09 MED ORDER — HYDROCODONE-IBUPROFEN 7.5-200 MG PO TABS: 1.0000 | ORAL_TABLET | Freq: Three times a day (TID) | ORAL | Status: AC | PRN

## 2011-10-09 MED ORDER — GLYCINE 1.5 % IR SOLN
Status: DC | PRN
  Administered 2011-10-09: 1

## 2011-10-09 MED ORDER — KETOROLAC TROMETHAMINE 30 MG/ML IJ SOLN
INTRAMUSCULAR | Status: DC | PRN
  Administered 2011-10-09: 30 mg via INTRAVENOUS

## 2011-10-09 MED ORDER — ONDANSETRON HCL 4 MG/2ML IJ SOLN
INTRAMUSCULAR | Status: DC | PRN
  Administered 2011-10-09: 4 mg via INTRAVENOUS

## 2011-10-09 SURGICAL SUPPLY — 31 items
CANISTER SUCTION 2500CC (MISCELLANEOUS) ×2 IMPLANT
CATH ROBINSON RED A/P 16FR (CATHETERS) ×1 IMPLANT
CLOTH BEACON ORANGE TIMEOUT ST (SAFETY) ×2 IMPLANT
CORD ACTIVE DISPOSABLE (ELECTRODE)
CORD ELECTRO ACTIVE DISP (ELECTRODE) IMPLANT
COVER TABLE BACK 60X90 (DRAPES) ×2 IMPLANT
DRAPE CAMERA CLOSED 9X96 (DRAPES) ×2 IMPLANT
DRAPE LG THREE QUARTER DISP (DRAPES) ×2 IMPLANT
DRESSING TELFA 8X3 (GAUZE/BANDAGES/DRESSINGS) ×2 IMPLANT
ELECT LOOP GYNE PRO 24FR (CUTTING LOOP)
ELECT REM PT RETURN 9FT ADLT (ELECTROSURGICAL) ×2
ELECT VAPORTRODE GRVD BAR (ELECTRODE) IMPLANT
ELECTRODE LOOP GYNE PRO 24FR (CUTTING LOOP) IMPLANT
ELECTRODE REM PT RTRN 9FT ADLT (ELECTROSURGICAL) ×1 IMPLANT
ELECTRODE ROLLER BARREL 22FR (ELECTROSURGICAL) IMPLANT
ELECTRODE VAPORCUT 22FR (ELECTROSURGICAL) IMPLANT
GLOVE BIO SURGEON STRL SZ7 (GLOVE) ×4 IMPLANT
GLOVE BIOGEL PI IND STRL 6.5 (GLOVE) IMPLANT
GLOVE BIOGEL PI INDICATOR 6.5 (GLOVE) ×1
GLYCINE 1.5% IRRIG UROMATIC (IV SOLUTION) ×1 IMPLANT
GOWN BRE IMP SLV AUR LG STRL (GOWN DISPOSABLE) ×1 IMPLANT
GOWN BRE IMP SLV AUR XL STRL (GOWN DISPOSABLE) ×1 IMPLANT
GOWN PREVENTION PLUS LG XLONG (DISPOSABLE) ×2 IMPLANT
LEGGING LITHOTOMY PAIR STRL (DRAPES) ×2 IMPLANT
LOOP ANGLED CUTTING 22FR (CUTTING LOOP) IMPLANT
PACK BASIN DAY SURGERY FS (CUSTOM PROCEDURE TRAY) ×2 IMPLANT
PAD OB MATERNITY 4.3X12.25 (PERSONAL CARE ITEMS) ×2 IMPLANT
PAD PREP 24X48 CUFFED NSTRL (MISCELLANEOUS) ×2 IMPLANT
TOWEL OR 17X24 6PK STRL BLUE (TOWEL DISPOSABLE) ×4 IMPLANT
TUBING HYDROFLEX HYSTEROSCOPY (TUBING) ×2 IMPLANT
WATER STERILE IRR 500ML POUR (IV SOLUTION) ×2 IMPLANT

## 2011-10-09 NOTE — Transfer of Care (Signed)
Immediate Anesthesia Transfer of Care Note  Patient: Caitlyn [name] [facility]  Procedure(s) Performed:  HYSTEROSCOPY - HYSTEROSCOPY D & C  Patient Location: PACU  Anesthesia Type: General  Level of Consciousness: awake, alert  and oriented  Airway & Oxygen Therapy: Patient Spontanous Breathing and Patient connected to face mask oxygen  Post-op Assessment: Report given to PACU RN and Post -op Vital signs reviewed and stable  Post vital signs: Reviewed and stable  Complications: No apparent anesthesia complications

## 2011-10-09 NOTE — Progress Notes (Signed)
No change in history or examination

## 2011-10-09 NOTE — Anesthesia Preprocedure Evaluation (Addendum)
Anesthesia Evaluation  Patient identified by MRN, date of birth, ID band Patient awake    Reviewed: Allergy & Precautions, H&P , NPO status , Patient's Chart, lab work & pertinent test results  Airway Mallampati: II TM Distance: <3 FB Neck ROM: Full    Dental   Pulmonary neg pulmonary ROS, asthma ,  clear to auscultation Mild cough        Cardiovascular hypertension, neg cardio ROS Regular Normal    Neuro/Psych Negative Neurological ROS  Negative Psych ROS   GI/Hepatic negative GI ROS, Neg liver ROS, GERD-  ,  Endo/Other  Negative Endocrine ROSDiabetes mellitus-, Type 2Morbid obesity  Renal/GU negative Renal ROS  Genitourinary negative   Musculoskeletal negative musculoskeletal ROS (+)   Abdominal   Peds negative pediatric ROS (+)  Hematology negative hematology ROS (+)   Anesthesia Other Findings   Reproductive/Obstetrics negative OB ROS                          Anesthesia Physical Anesthesia Plan  ASA: III  Anesthesia Plan: General   Post-op Pain Management:    Induction:   Airway Management Planned: LMA  Additional Equipment:   Intra-op Plan:   Post-operative Plan:   Informed Consent: I have reviewed the patients History and Physical, chart, labs and discussed the procedure including the risks, benefits and alternatives for the proposed anesthesia with the patient or authorized representative who has indicated his/her understanding and acceptance.   Dental advisory given  Plan Discussed with: CRNA  Anesthesia Plan Comments:        Anesthesia Quick Evaluation

## 2011-10-09 NOTE — Op Note (Signed)
Preoperative diagnosis: Abnormal uterine bleeding, endometrial polyp  Postoperative diagnoses: Same  Procedure: D&C hysteroscopy  Surgeon: Matthew Saras  Specimens removed: Endometrial curettings, endometrial polyp  Complications: None  Blood loss: Minimal  Procedure and findings:  Patient taken the operating room after an adequate level of general anesthesia was obtained with the patient's legs in stirrups the perineum and vagina were prepped and draped in the usual manner for D&C, hysteroscopy. The bladder was drained he UA carried out uterus mid position normal size, mobile adnexa negative. Weighted speculum was positioned paracervical block was then created by infiltrating at 3 and 9:00 submucosally of 7 cc of 1% Xylocaine on each side after negative aspiration. The uterus was then sounded to 8 cm, progressively dilated to a 27-29 Pratt dilator the continuous flow hysteroscope was then used to inspect the cavity revealing a well-defined polyp. Polyp forceps then used to explore the cavity the polyp was a bolster removed in toto, sent to pathology sharp curettage carried out with minimal tissue noted. The scope was reinserted, the cavity was irrigated, pictures were taken was noted to be clean at that point. Minimal bleeding she did receive Toradol at the end of the case and went to recovery room in good condition.  Dictated with [facility], not proofread Chrisanne Loose M. Garry Heater.D.

## 2011-10-09 NOTE — Anesthesia Postprocedure Evaluation (Signed)
  Anesthesia Post-op Note  Patient: Caitlyn [name] [facility]  Procedure(s) Performed:  HYSTEROSCOPY - HYSTEROSCOPY D & C  Patient Location: PACU  Anesthesia Type: General  Level of Consciousness: awake and alert   Airway and Oxygen Therapy: Patient Spontanous Breathing  Post-op Pain: mild  Post-op Assessment: Post-op Vital signs reviewed, Patient's Cardiovascular Status Stable, Respiratory Function Stable, Patent Airway and No signs of Nausea or vomiting  Post-op Vital Signs: stable  Complications: No apparent anesthesia complications

## 2011-10-09 NOTE — Anesthesia Procedure Notes (Signed)
Procedure Name: LMA Insertion Date/Time: 10/09/2011 8:32 AM Performed by: Wyvonna Plum Pre-anesthesia Checklist: Patient identified, Emergency Drugs available, Suction available and Patient being monitored Patient Re-evaluated:Patient Re-evaluated prior to inductionOxygen Delivery Method: Circle System Utilized Preoxygenation: Pre-oxygenation with 100% oxygen Intubation Type: IV induction Ventilation: Mask ventilation without difficulty LMA: LMA inserted LMA Size: 4.0 Number of attempts: 1 Airway Equipment and Method: bite block Placement Confirmation: positive ETCO2 and breath sounds checked- equal and bilateral Tube secured with: Tape Dental Injury: Teeth and Oropharynx as per pre-operative assessment

## 2011-10-25 ENCOUNTER — Encounter (HOSPITAL_BASED_OUTPATIENT_CLINIC_OR_DEPARTMENT_OTHER): Payer: Self-pay | Admitting: Obstetrics and Gynecology

## 2011-10-25 NOTE — Addendum Note (Signed)
Addendum  created 10/25/11 0820 by Fara Chute, MD   Modules edited:Anesthesia Responsible Staff

## 2011-10-25 NOTE — Addendum Note (Signed)
Addendum  created 10/25/11 0818 by Fara Chute, MD   Modules edited:Anesthesia Responsible Staff

## 2012-10-30 ENCOUNTER — Other Ambulatory Visit: Payer: Self-pay | Admitting: Family Medicine

## 2012-10-30 ENCOUNTER — Ambulatory Visit
Admission: RE | Admit: 2012-10-30 | Discharge: 2012-10-30 | Disposition: A | Discharge status: Home / Self Care | Payer: 59 | Source: Ambulatory Visit | Attending: Family Medicine | Admitting: Family Medicine

## 2012-10-30 DIAGNOSIS — R509 Fever, unspecified: Secondary | ICD-10-CM

## 2012-11-09 ENCOUNTER — Other Ambulatory Visit: Payer: Self-pay | Admitting: Family Medicine

## 2012-11-09 DIAGNOSIS — R509 Fever, unspecified: Secondary | ICD-10-CM

## 2012-11-09 DIAGNOSIS — R7989 Other specified abnormal findings of blood chemistry: Secondary | ICD-10-CM

## 2012-11-11 ENCOUNTER — Ambulatory Visit
Admission: RE | Admit: 2012-11-11 | Discharge: 2012-11-11 | Disposition: A | Discharge status: Home / Self Care | Payer: 59 | Source: Ambulatory Visit | Attending: Family Medicine | Admitting: Family Medicine

## 2012-11-11 DIAGNOSIS — R509 Fever, unspecified: Secondary | ICD-10-CM

## 2012-11-11 DIAGNOSIS — R7989 Other specified abnormal findings of blood chemistry: Secondary | ICD-10-CM

## 2012-11-11 MED ORDER — IOHEXOL 300 MG/ML  SOLN
125.0000 mL | Freq: Once | INTRAMUSCULAR | Status: AC | PRN
  Administered 2012-11-11: 125 mL via INTRAVENOUS

## 2013-01-07 LAB — HM COLONOSCOPY: HM COLON: NORMAL

## 2013-07-06 LAB — HM DIABETES EYE EXAM

## 2013-10-27 ENCOUNTER — Other Ambulatory Visit: Payer: Self-pay | Admitting: Family Medicine

## 2013-10-27 DIAGNOSIS — E785 Hyperlipidemia, unspecified: Secondary | ICD-10-CM

## 2013-10-27 DIAGNOSIS — Z79899 Other long term (current) drug therapy: Secondary | ICD-10-CM

## 2013-10-27 DIAGNOSIS — I10 Essential (primary) hypertension: Secondary | ICD-10-CM

## 2013-10-28 ENCOUNTER — Other Ambulatory Visit: Payer: Self-pay | Admitting: Family Medicine

## 2013-10-28 ENCOUNTER — Other Ambulatory Visit: Payer: 59

## 2013-10-28 DIAGNOSIS — Z79899 Other long term (current) drug therapy: Secondary | ICD-10-CM

## 2013-10-28 DIAGNOSIS — E785 Hyperlipidemia, unspecified: Secondary | ICD-10-CM

## 2013-10-28 DIAGNOSIS — I10 Essential (primary) hypertension: Secondary | ICD-10-CM

## 2013-10-28 LAB — LIPID PANEL
Cholesterol: 151 mg/dL (ref 0–200)
HDL: 40 mg/dL (ref >39)
Total CHOL/HDL Ratio: 3.8 Ratio

## 2013-10-28 LAB — COMPREHENSIVE METABOLIC PANEL
BUN: 17 mg/dL (ref 6–23)
CO2: 28 mEq/L (ref 19–32)
Calcium: 9.3 mg/dL (ref 8.4–10.5)
Chloride: 102 mEq/L (ref 96–112)
Creat: 0.83 mg/dL (ref 0.50–1.10)

## 2013-10-28 LAB — CBC WITH DIFFERENTIAL/PLATELET
Eosinophils Relative: 2 % (ref 0–5)
HCT: 40.5 % (ref 36.0–46.0)
Lymphocytes Relative: 44 % (ref 12–46)
Lymphs Abs: 2.5 10*3/uL (ref 0.7–4.0)
MCV: 84.9 fL (ref 78.0–100.0)
Monocytes Absolute: 0.4 10*3/uL (ref 0.1–1.0)
RBC: 4.77 MIL/uL (ref 3.87–5.11)
WBC: 5.7 10*3/uL (ref 4.0–10.5)

## 2013-10-29 ENCOUNTER — Ambulatory Visit (INDEPENDENT_AMBULATORY_CARE_PROVIDER_SITE_OTHER): Payer: 59 | Admitting: Family Medicine

## 2013-10-29 ENCOUNTER — Encounter: Payer: Self-pay | Admitting: Family Medicine

## 2013-10-29 ENCOUNTER — Other Ambulatory Visit: Payer: Self-pay | Admitting: Family Medicine

## 2013-10-29 ENCOUNTER — Telehealth: Payer: Self-pay | Admitting: Family Medicine

## 2013-10-29 VITALS — BP 120/88 | HR 98 | Temp 97.8°F | Resp 18 | Wt 283.0 lb

## 2013-10-29 DIAGNOSIS — E119 Type 2 diabetes mellitus without complications: Secondary | ICD-10-CM

## 2013-10-29 DIAGNOSIS — R82998 Other abnormal findings in urine: Secondary | ICD-10-CM

## 2013-10-29 DIAGNOSIS — F410 Panic disorder [episodic paroxysmal anxiety] without agoraphobia: Secondary | ICD-10-CM

## 2013-10-29 DIAGNOSIS — R8290 Unspecified abnormal findings in urine: Secondary | ICD-10-CM

## 2013-10-29 DIAGNOSIS — R300 Dysuria: Secondary | ICD-10-CM

## 2013-10-29 LAB — URINALYSIS, ROUTINE W REFLEX MICROSCOPIC
Bilirubin Urine: NEGATIVE
Glucose, UA: NEGATIVE mg/dL
Hgb urine dipstick: NEGATIVE
Protein, ur: 30 mg/dL — AB

## 2013-10-29 LAB — URINALYSIS, MICROSCOPIC ONLY: Crystals: NONE SEEN

## 2013-10-29 MED ORDER — MONTELUKAST SODIUM 10 MG PO TABS: 10.0000 mg | ORAL_TABLET | Freq: Every day | ORAL | Status: DC

## 2013-10-29 MED ORDER — METOPROLOL SUCCINATE ER 25 MG PO TB24: ORAL_TABLET | ORAL | Status: DC

## 2013-10-29 MED ORDER — LORAZEPAM 1 MG PO TABS: 1.0000 mg | ORAL_TABLET | Freq: Two times a day (BID) | ORAL | Status: DC | PRN

## 2013-10-29 MED ORDER — VENLAFAXINE HCL ER 150 MG PO CP24: ORAL_CAPSULE | ORAL | Status: DC

## 2013-10-29 MED ORDER — LOSARTAN POTASSIUM 50 MG PO TABS: ORAL_TABLET | ORAL | Status: DC

## 2013-10-29 MED ORDER — AMLODIPINE BESYLATE 10 MG PO TABS: ORAL_TABLET | ORAL | Status: DC

## 2013-10-29 MED ORDER — CIPROFLOXACIN HCL 500 MG PO TABS: 500.0000 mg | ORAL_TABLET | Freq: Two times a day (BID) | ORAL | Status: DC

## 2013-10-29 NOTE — Addendum Note (Signed)
Addended by: Shary Decamp B on: 10/29/2013 02:11 PM   Modules accepted: Orders, Medications

## 2013-10-29 NOTE — Telephone Encounter (Signed)
Medication refilled per protocol. 

## 2013-10-29 NOTE — Progress Notes (Signed)
Subjective:    Patient ID: Caitlyn [name], female    DOB: 04-26-1956, 57 y.o.   MRN: [id number]  HPI Patient has type 2 diabetes mellitus which is currently diet controlled. She is here today for followup. Chest has a history of hypertension as well as hyperlipidemia. He denies any chest pain, shortness of breath, dyspnea on exertion. She is here for her routine meds and check. Her most recent lab work is listed below. Everything looks acceptable. Her hemoglobin A1c is still pending. Appointment on 10/28/2013  Component Date Value Range Status  . WBC 10/28/2013 5.7  4.0 - 10.5 K/uL Final  . RBC 10/28/2013 4.77  3.87 - 5.11 MIL/uL Final  . Hemoglobin 10/28/2013 14.3  12.0 - 15.0 g/dL Final  . HCT 10/28/2013 40.5  36.0 - 46.0 % Final  . MCV 10/28/2013 84.9  78.0 - 100.0 fL Final  . MCH 10/28/2013 30.0  26.0 - 34.0 pg Final  . MCHC 10/28/2013 35.3  30.0 - 36.0 g/dL Final  . RDW 10/28/2013 13.5  11.5 - 15.5 % Final  . Platelets 10/28/2013 294  150 - 400 K/uL Final  . Neutrophils Relative % 10/28/2013 45  43 - 77 % Final  . Neutro Abs 10/28/2013 2.6  1.7 - 7.7 K/uL Final  . Lymphocytes Relative 10/28/2013 44  12 - 46 % Final  . Lymphs Abs 10/28/2013 2.5  0.7 - 4.0 K/uL Final  . Monocytes Relative 10/28/2013 8  3 - 12 % Final  . Monocytes Absolute 10/28/2013 0.4  0.1 - 1.0 K/uL Final  . Eosinophils Relative 10/28/2013 2  0 - 5 % Final  . Eosinophils Absolute 10/28/2013 0.1  0.0 - 0.7 K/uL Final  . Basophils Relative 10/28/2013 1  0 - 1 % Final  . Basophils Absolute 10/28/2013 0.0  0.0 - 0.1 K/uL Final  . Smear Review 10/28/2013 Criteria for review not met   Final  . Sodium 10/28/2013 137  135 - 145 mEq/L Final  . Potassium 10/28/2013 4.6  3.5 - 5.3 mEq/L Final  . Chloride 10/28/2013 102  96 - 112 mEq/L Final  . CO2 10/28/2013 28  19 - 32 mEq/L Final  . Glucose, Bld 10/28/2013 126* 70 - 99 mg/dL Final  . BUN 10/28/2013 17  6 - 23 mg/dL Final  . Creat 10/28/2013 0.83  0.50 - 1.10 mg/dL Final   . Total Bilirubin 10/28/2013 0.8  0.3 - 1.2 mg/dL Final  . Alkaline Phosphatase 10/28/2013 65  39 - 117 U/L Final  . AST 10/28/2013 20  0 - 37 U/L Final  . ALT 10/28/2013 23  0 - 35 U/L Final  . Total Protein 10/28/2013 7.4  6.0 - 8.3 g/dL Final  . Albumin 10/28/2013 4.1  3.5 - 5.2 g/dL Final  . Calcium 10/28/2013 9.3  8.4 - 10.5 mg/dL Final  . Cholesterol 10/28/2013 151  0 - 200 mg/dL Final   Comment: ATP III Classification:                                < 200        mg/dL        Desirable                               200 - 239     mg/dL        Borderline  High                               >= 240        mg/dL        High                             . Triglycerides 10/28/2013 108  <150 mg/dL Final  . HDL 10/28/2013 40  >39 mg/dL Final  . Total CHOL/HDL Ratio 10/28/2013 3.8   Final  . VLDL 10/28/2013 22  0 - 40 mg/dL Final  . LDL Cholesterol 10/28/2013 89  0 - 99 mg/dL Final   Comment:                            Total Cholesterol/HDL Ratio:CHD Risk                                                 Coronary Heart Disease Risk Table                                                                 Men       Women                                   1/2 Average Risk              3.4        3.3                                       Average Risk              5.0        4.4                                    2X Average Risk              9.6        7.1                                    3X Average Risk             23.4       11.0                          Use the calculated Patient Ratio above and the CHD Risk table                           to determine the patient's CHD Risk.  ATP III Classification (LDL):                                < 100        mg/dL         Optimal                               100 - 129     mg/dL         Near or Above Optimal                               130 - 159     mg/dL         Borderline High                               160 - 189      mg/dL         High                                > 190        mg/dL         Very High                              However she is also complaining of foul-smelling urine, polyuria, dysuria, and urgency for the last 3 days. She denies any fevers or chills or nausea or vomiting or diarrhea. She also has a history of panic attacks. She usually has 1 or 2 every month. She like a prescription of Ativan to have on hand in case she has a panic attack. This stems from stress in dealing with her father who has severe dementia as well as some stress at home as her adult son is abusing drugs. Past Medical History  Diagnosis Date  . Diabetes mellitus     oral med  . Hypertension   . Reflux occasional    watches diet  . Asthma   . Cold and cough --- no fever  . Complication of anesthesia     24 yrs ago post tubal-- pulm. edema  (no problems post bowel surg. 2009)  . Diverticulitis hx  -- w/ perf. bowel    s/p resection 2009   Current Outpatient Prescriptions on File Prior to Visit  Medication Sig Dispense Refill  . albuterol (PROVENTIL HFA;VENTOLIN HFA) 108 (90 BASE) MCG/ACT inhaler Inhale 2 puffs into the lungs every 6 (six) hours as needed.        Marland Kitchen amLODipine (NORVASC) 10 MG tablet Take 10 mg by mouth every evening.        Marland Kitchen losartan (COZAAR) 25 MG tablet Take 25 mg by mouth every evening.        . metoprolol succinate (TOPROL-XL) 25 MG 24 hr tablet Take 25 mg by mouth every evening.        . montelukast (SINGULAIR) 10 MG tablet Take 10 mg by mouth at bedtime.        Marland Kitchen venlafaxine (EFFEXOR-XR) 150 MG 24 hr capsule Take 150 mg by mouth daily.        . Fluticasone-Salmeterol (ADVAIR)  100-50 MCG/DOSE AEPB Inhale 2 puffs into the lungs 2 (two) times daily.         No current facility-administered medications on file prior to visit.   Allergies  Allergen Reactions  . Erythromycin Other (See Comments)    Gi upset/ cramps  . Hctz [Hydrochlorothiazide]     CAUSED GOUT   History   Social  History  . Marital Status: Married    Spouse Name: N/A    Number of Children: N/A  . Years of Education: N/A   Occupational History  . Not on file.   Social History Main Topics  . Smoking status: Never Smoker   . Smokeless tobacco: Never Used  . Alcohol Use: No  . Drug Use: No  . Sexual Activity:    Other Topics Concern  . Not on file   Social History Narrative  . No narrative on file      Review of Systems  All other systems reviewed and are negative.       Objective:   Physical Exam  Vitals reviewed. Cardiovascular: Normal rate, regular rhythm, normal heart sounds and intact distal pulses.   No murmur heard. Pulmonary/Chest: Effort normal and breath sounds normal. No respiratory distress. She has no wheezes. She has no rales. She exhibits no tenderness.  Abdominal: Soft. Bowel sounds are normal. She exhibits no distension. There is no tenderness. There is no rebound and no guarding.  Musculoskeletal: She exhibits edema.          Assessment & Plan:  1. Burning with urination - Urinalysis, Routine w reflex microscopic - Urine culture  2. Foul smelling urine Urinalysis and history confirmed urinary tract infection. Begin Cipro 500 mg by mouth twice a day for 5 days. On the her hemoglobin A1c of urine - Urinalysis, Routine w reflex microscopic - Urine culture  3. Panic attacks I gave the patient one time prescription for Ativan 1 mg tablets. She can take one tablet every 8 hours as needed for panic attacks. Gave her 30 tablets 0 refills. Hopefully this last 12 months. - LORazepam (ATIVAN) 1 MG tablet; Take 1 tablet (1 mg total) by mouth 2 (two) times daily as needed for anxiety.  Dispense: 30 tablet; Refill: 0  4. Type II or unspecified type diabetes mellitus without mention of complication, not stated as uncontrolled Blood pressure and cholesterol are well controlled. I recommended the patient decrease amlodipine to 5 mg a day given her peripheral edema.  I  will await the results of her hemoglobin A1c.

## 2013-10-29 NOTE — Telephone Encounter (Signed)
Pt is needing all 5 medications refilled Call back number is [phone] [facility]

## 2013-10-31 LAB — URINE CULTURE

## 2013-11-01 ENCOUNTER — Encounter: Payer: Self-pay | Admitting: *Deleted

## 2013-11-03 MED ORDER — CEPHALEXIN 500 MG PO CAPS: 500.0000 mg | ORAL_CAPSULE | Freq: Three times a day (TID) | ORAL | Status: DC

## 2013-11-03 NOTE — Telephone Encounter (Signed)
Keflex sent to pharm

## 2014-03-11 ENCOUNTER — Encounter: Payer: Self-pay | Admitting: Family Medicine

## 2014-03-11 ENCOUNTER — Ambulatory Visit (INDEPENDENT_AMBULATORY_CARE_PROVIDER_SITE_OTHER): Payer: 59 | Admitting: Family Medicine

## 2014-03-11 ENCOUNTER — Other Ambulatory Visit: Payer: Self-pay | Admitting: Family Medicine

## 2014-03-11 VITALS — BP 132/80 | HR 80 | Temp 97.0°F | Resp 16 | Ht 67.0 in | Wt 283.0 lb

## 2014-03-11 DIAGNOSIS — J019 Acute sinusitis, unspecified: Secondary | ICD-10-CM

## 2014-03-11 MED ORDER — FLUTICASONE PROPIONATE 50 MCG/ACT NA SUSP: 2.0000 | Freq: Every day | NASAL | Status: DC

## 2014-03-11 MED ORDER — CEFDINIR 300 MG PO CAPS
300.0000 mg | ORAL_CAPSULE | Freq: Two times a day (BID) | ORAL | Status: DC
Start: 2014-03-11 — End: 2014-09-30

## 2014-03-11 MED ORDER — HYDROCODONE-ACETAMINOPHEN 5-325 MG PO TABS: 1.0000 | ORAL_TABLET | Freq: Four times a day (QID) | ORAL | Status: DC | PRN

## 2014-03-11 NOTE — Telephone Encounter (Signed)
Medication refilled per protocol. 

## 2014-03-11 NOTE — Progress Notes (Signed)
Subjective:    Patient ID: Caitlyn [name], female    DOB: 11-Nov-1956, 58 y.o.   MRN: [id number]  HPI Patient had symptoms of a sinus infection for 10 days. Includes bilateral maxillary sinus pain, bilateral ear pain, purulent nasal drainage, low-grade fevers, postnasal drip, and a productive cough. She denies any shortness of breath although she is wheezing slightly. Past Medical History  Diagnosis Date  . Diabetes mellitus     oral med  . Hypertension   . Reflux occasional    watches diet  . Asthma   . Cold and cough --- no fever  . Complication of anesthesia     24 yrs ago post tubal-- pulm. edema  (no problems post bowel surg. 2009)  . Diverticulitis hx  -- w/ perf. bowel    s/p resection 2009   Current Outpatient Prescriptions on File Prior to Visit  Medication Sig Dispense Refill  . albuterol (PROVENTIL HFA;VENTOLIN HFA) 108 (90 BASE) MCG/ACT inhaler Inhale 2 puffs into the lungs every 6 (six) hours as needed.        Marland Kitchen amLODipine (NORVASC) 10 MG tablet Take 1 tablet by mouth  every day  90 tablet  4  . fexofenadine (ALLEGRA) 180 MG tablet Take 180 mg by mouth daily.      . Fluticasone-Salmeterol (ADVAIR) 100-50 MCG/DOSE AEPB Inhale 2 puffs into the lungs 2 (two) times daily.        Marland Kitchen LORazepam (ATIVAN) 1 MG tablet Take 1 tablet (1 mg total) by mouth 2 (two) times daily as needed for anxiety.  30 tablet  0  . losartan (COZAAR) 50 MG tablet Take 1 tablet by mouth  every day  90 tablet  4  . metoprolol succinate (TOPROL-XL) 25 MG 24 hr tablet Take 1 tablet by mouth  every day  90 tablet  4  . montelukast (SINGULAIR) 10 MG tablet Take 1 tablet (10 mg total) by mouth at bedtime.  90 tablet  4  . venlafaxine XR (EFFEXOR-XR) 150 MG 24 hr capsule Take 1 capsule by mouth  every day  90 capsule  4   No current facility-administered medications on file prior to visit.   Allergies  Allergen Reactions  . Erythromycin Other (See Comments)    Gi upset/ cramps  . Hctz [Hydrochlorothiazide]      CAUSED GOUT   History   Social History  . Marital Status: Married    Spouse Name: N/A    Number of Children: N/A  . Years of Education: N/A   Occupational History  . Not on file.   Social History Main Topics  . Smoking status: Never Smoker   . Smokeless tobacco: Never Used  . Alcohol Use: No  . Drug Use: No  . Sexual Activity:    Other Topics Concern  . Not on file   Social History Narrative  . No narrative on file      Review of Systems  All other systems reviewed and are negative.      Objective:   Physical Exam  Vitals reviewed. Constitutional: She appears well-developed and well-nourished.  HENT:  Right Ear: External ear normal.  Left Ear: External ear normal.  Nose: Mucosal edema and rhinorrhea present. Right sinus exhibits maxillary sinus tenderness and frontal sinus tenderness. Left sinus exhibits maxillary sinus tenderness and frontal sinus tenderness.  Mouth/Throat: Oropharynx is clear and moist. No oropharyngeal exudate.  Eyes: Conjunctivae are normal. Pupils are equal, round, and reactive to light.  Neck: Neck  supple.  Cardiovascular: Normal rate, regular rhythm and normal heart sounds.   Pulmonary/Chest: Effort normal. She has wheezes.  Lymphadenopathy:    She has no cervical adenopathy.          Assessment & Plan:  1. Acute rhinosinusitis - cefdinir (OMNICEF) 300 MG capsule; Take 1 capsule (300 mg total) by mouth 2 (two) times daily.  Dispense: 20 capsule; Refill: 0 - fluticasone (FLONASE) 50 MCG/ACT nasal spray; Place 2 sprays into both nostrils daily.  Dispense: 16 g; Refill: 6

## 2014-03-11 NOTE — Addendum Note (Signed)
Addended by: Shary Decamp B on: 03/11/2014 05:04 PM   Modules accepted: Orders

## 2014-08-12 LAB — HM DIABETES EYE EXAM

## 2014-09-13 ENCOUNTER — Encounter: Payer: Self-pay | Admitting: Family Medicine

## 2014-09-22 ENCOUNTER — Other Ambulatory Visit: Payer: Self-pay | Admitting: Family Medicine

## 2014-09-22 DIAGNOSIS — I10 Essential (primary) hypertension: Secondary | ICD-10-CM

## 2014-09-22 DIAGNOSIS — Z79899 Other long term (current) drug therapy: Secondary | ICD-10-CM

## 2014-09-23 ENCOUNTER — Other Ambulatory Visit: Payer: 59

## 2014-09-23 ENCOUNTER — Other Ambulatory Visit: Payer: Self-pay | Admitting: Family Medicine

## 2014-09-23 DIAGNOSIS — Z79899 Other long term (current) drug therapy: Secondary | ICD-10-CM

## 2014-09-23 DIAGNOSIS — I10 Essential (primary) hypertension: Secondary | ICD-10-CM

## 2014-09-23 LAB — CBC WITH DIFFERENTIAL/PLATELET
BASOS ABS: 0.1 10*3/uL (ref 0.0–0.1)
Basophils Relative: 1 % (ref 0–1)
EOS ABS: 0.2 10*3/uL (ref 0.0–0.7)
EOS PCT: 3 % (ref 0–5)
HCT: 40.2 % (ref 36.0–46.0)
Hemoglobin: 13.8 g/dL (ref 12.0–15.0)
Lymphocytes Relative: 39 % (ref 12–46)
Lymphs Abs: 2.4 10*3/uL (ref 0.7–4.0)
MCH: 29.9 pg (ref 26.0–34.0)
MCHC: 34.3 g/dL (ref 30.0–36.0)
MCV: 87 fL (ref 78.0–100.0)
Monocytes Absolute: 0.4 10*3/uL (ref 0.1–1.0)
Monocytes Relative: 7 % (ref 3–12)
NEUTROS PCT: 50 % (ref 43–77)
Neutro Abs: 3.1 10*3/uL (ref 1.7–7.7)
PLATELETS: 301 10*3/uL (ref 150–400)
RBC: 4.62 MIL/uL (ref 3.87–5.11)
RDW: 13.3 % (ref 11.5–15.5)
WBC: 6.1 10*3/uL (ref 4.0–10.5)

## 2014-09-23 LAB — COMPREHENSIVE METABOLIC PANEL
ALK PHOS: 89 U/L (ref 39–117)
ALT: 26 U/L (ref 0–35)
AST: 19 U/L (ref 0–37)
Albumin: 4.1 g/dL (ref 3.5–5.2)
BILIRUBIN TOTAL: 0.6 mg/dL (ref 0.2–1.2)
BUN: 15 mg/dL (ref 6–23)
CO2: 29 mEq/L (ref 19–32)
CREATININE: 0.81 mg/dL (ref 0.50–1.10)
Calcium: 9.5 mg/dL (ref 8.4–10.5)
Chloride: 104 mEq/L (ref 96–112)
Glucose, Bld: 155 mg/dL — ABNORMAL HIGH (ref 70–99)
Potassium: 4.8 mEq/L (ref 3.5–5.3)
SODIUM: 141 meq/L (ref 135–145)
Total Protein: 6.9 g/dL (ref 6.0–8.3)

## 2014-09-23 LAB — LIPID PANEL
CHOL/HDL RATIO: 3.4 ratio
CHOLESTEROL: 158 mg/dL (ref 0–200)
HDL: 46 mg/dL (ref >39)
LDL CALC: 95 mg/dL (ref 0–99)
Triglycerides: 86 mg/dL (ref <150)
VLDL: 17 mg/dL (ref 0–40)

## 2014-09-28 LAB — HEMOGLOBIN A1C
HEMOGLOBIN A1C: 6.6 % — AB (ref <5.7)
Mean Plasma Glucose: 143 mg/dL — ABNORMAL HIGH (ref <117)

## 2014-09-30 ENCOUNTER — Encounter: Payer: Self-pay | Admitting: Family Medicine

## 2014-09-30 ENCOUNTER — Ambulatory Visit (INDEPENDENT_AMBULATORY_CARE_PROVIDER_SITE_OTHER): Payer: 59 | Admitting: Family Medicine

## 2014-09-30 VITALS — BP 118/70 | HR 72 | Temp 98.3°F | Resp 18 | Ht 67.0 in | Wt 279.0 lb

## 2014-09-30 DIAGNOSIS — E785 Hyperlipidemia, unspecified: Secondary | ICD-10-CM

## 2014-09-30 DIAGNOSIS — I10 Essential (primary) hypertension: Secondary | ICD-10-CM

## 2014-09-30 DIAGNOSIS — E119 Type 2 diabetes mellitus without complications: Secondary | ICD-10-CM

## 2014-09-30 NOTE — Progress Notes (Signed)
Subjective:    Patient ID: Caitlyn [name], female    DOB: [birth year], 57 y.o.   MRN: [id number]  HPI Patient is a very pleasant 58 y/o WF who presents today for a follow-up of her diabetes. She denies any chest pain shortness of breath or dyspnea on exertion. She denies any polyuria, polydipsia, or blurred vision. She denies any neuropathy in her feet. She recently had her diabetic eye exam within the last 2 months. Her colonoscopy was performed last year. Her Pap smear and mammogram was performed at her gynecologist earlier this year. The patient had her flu shot at work. Her most recent lab work as listed below: Appointment on 09/23/2014  Component Date Value Ref Range Status  . WBC 09/23/2014 6.1  4.0 - 10.5 K/uL Final  . RBC 09/23/2014 4.62  3.87 - 5.11 MIL/uL Final  . Hemoglobin 09/23/2014 13.8  12.0 - 15.0 g/dL Final  . HCT 09/23/2014 40.2  36.0 - 46.0 % Final  . MCV 09/23/2014 87.0  78.0 - 100.0 fL Final  . MCH 09/23/2014 29.9  26.0 - 34.0 pg Final  . MCHC 09/23/2014 34.3  30.0 - 36.0 g/dL Final  . RDW 09/23/2014 13.3  11.5 - 15.5 % Final  . Platelets 09/23/2014 301  150 - 400 K/uL Final  . Neutrophils Relative % 09/23/2014 50  43 - 77 % Final  . Neutro Abs 09/23/2014 3.1  1.7 - 7.7 K/uL Final  . Lymphocytes Relative 09/23/2014 39  12 - 46 % Final  . Lymphs Abs 09/23/2014 2.4  0.7 - 4.0 K/uL Final  . Monocytes Relative 09/23/2014 7  3 - 12 % Final  . Monocytes Absolute 09/23/2014 0.4  0.1 - 1.0 K/uL Final  . Eosinophils Relative 09/23/2014 3  0 - 5 % Final  . Eosinophils Absolute 09/23/2014 0.2  0.0 - 0.7 K/uL Final  . Basophils Relative 09/23/2014 1  0 - 1 % Final  . Basophils Absolute 09/23/2014 0.1  0.0 - 0.1 K/uL Final  . Smear Review 09/23/2014 Criteria for review not met   Final  . Sodium 09/23/2014 141  135 - 145 mEq/L Final  . Potassium 09/23/2014 4.8  3.5 - 5.3 mEq/L Final  . Chloride 09/23/2014 104  96 - 112 mEq/L Final  . CO2 09/23/2014 29  19 - 32 mEq/L Final  .  Glucose, Bld 09/23/2014 155* 70 - 99 mg/dL Final  . BUN 09/23/2014 15  6 - 23 mg/dL Final  . Creat 09/23/2014 0.81  0.50 - 1.10 mg/dL Final  . Total Bilirubin 09/23/2014 0.6  0.2 - 1.2 mg/dL Final  . Alkaline Phosphatase 09/23/2014 89  39 - 117 U/L Final  . AST 09/23/2014 19  0 - 37 U/L Final  . ALT 09/23/2014 26  0 - 35 U/L Final  . Total Protein 09/23/2014 6.9  6.0 - 8.3 g/dL Final  . Albumin 09/23/2014 4.1  3.5 - 5.2 g/dL Final  . Calcium 09/23/2014 9.5  8.4 - 10.5 mg/dL Final  . Cholesterol 09/23/2014 158  0 - 200 mg/dL Final   Comment: ATP III Classification:                                < 200        mg/dL        Desirable  200 - 239     mg/dL        Borderline High                               >= 240        mg/dL        High                             . Triglycerides 09/23/2014 86  <150 mg/dL Final  . HDL 09/23/2014 46  >39 mg/dL Final  . Total CHOL/HDL Ratio 09/23/2014 3.4   Final  . VLDL 09/23/2014 17  0 - 40 mg/dL Final  . LDL Cholesterol 09/23/2014 95  0 - 99 mg/dL Final   Comment:                            Total Cholesterol/HDL Ratio:CHD Risk                                                 Coronary Heart Disease Risk Table                                                                 Men       Women                                   1/2 Average Risk              3.4        3.3                                       Average Risk              5.0        4.4                                    2X Average Risk              9.6        7.1                                    3X Average Risk             23.4       11.0                          Use the calculated Patient Ratio above and the CHD Risk table  to determine the patient's CHD Risk.                          ATP III Classification (LDL):                                < 100        mg/dL         Optimal                               100 - 129     mg/dL         Near  or Above Optimal                               130 - 159     mg/dL         Borderline High                               160 - 189     mg/dL         High                                > 190        mg/dL         Very High                             Orders Only on 09/23/2014  Component Date Value Ref Range Status  . Hgb A1c MFr Bld 09/23/2014 6.6* <5.7 % Final   Comment:                                                                        According to the ADA Clinical Practice Recommendations for 2011, when HbA1c is used as a screening test:     >=6.5%   Diagnostic of Diabetes Mellitus            (if abnormal result is confirmed)   5.7-6.4%   Increased risk of developing Diabetes Mellitus   References:Diagnosis and Classification of Diabetes Mellitus,Diabetes JHER,7408,14(GYJEH 1):S62-S69 and Standards of Medical Care in         Diabetes - 2011,Diabetes UDJS,9702,63 (Suppl 1):S11-S61.     . Mean Plasma Glucose 09/23/2014 143* <117 mg/dL Final   Past Medical History  Diagnosis Date  . Diabetes mellitus     oral med  . Hypertension   . Reflux occasional    watches diet  . Asthma   . Cold and cough --- no fever  . Complication of anesthesia     24 yrs ago post tubal-- pulm. edema  (no problems post bowel surg. 2009)  . Diverticulitis hx  -- w/ perf. bowel    s/p resection 2009   Past Surgical History  Procedure Laterality Date  . Tubal ligation  24  yrs ago  . Bowel resection  2009    diverticulitis w/ perf. bowel  . Hysteroscopy  10/09/2011    Procedure: HYSTEROSCOPY;  Surgeon: Margarette Asal;  Location: [facility];  Service: Gynecology;;  HYSTEROSCOPY D & C   Current Outpatient Prescriptions on File Prior to Visit  Medication Sig Dispense Refill  . albuterol (PROVENTIL HFA;VENTOLIN HFA) 108 (90 BASE) MCG/ACT inhaler Inhale 2 puffs into the lungs every 6 (six) hours as needed.      Marland Kitchen amLODipine (NORVASC) 10 MG tablet Take 1 tablet by mouth  every day  90 tablet 4  . fexofenadine (ALLEGRA) 180 MG tablet Take 180 mg by mouth daily.    Marland Kitchen LORazepam (ATIVAN) 1 MG tablet Take 1 tablet (1 mg total) by mouth 2 (two) times daily as needed for anxiety. 30 tablet 0  . losartan (COZAAR) 50 MG tablet Take 1 tablet by mouth  every day 90 tablet 4  . metoprolol succinate (TOPROL-XL) 25 MG 24 hr tablet Take 1 tablet by mouth  every day 90 tablet 4  . montelukast (SINGULAIR) 10 MG tablet Take 1 tablet (10 mg total) by mouth at bedtime. 90 tablet 4  . venlafaxine XR (EFFEXOR-XR) 150 MG 24 hr capsule Take 1 capsule by mouth  every day 90 capsule 4  . fluticasone (FLONASE) 50 MCG/ACT nasal spray INSTILL 2 SPRAYS IN EACH NOSTRIL EVERY DAY. 48 g 3  . Fluticasone-Salmeterol (ADVAIR) 100-50 MCG/DOSE AEPB Inhale 2 puffs into the lungs 2 (two) times daily.       No current facility-administered medications on file prior to visit.   Allergies  Allergen Reactions  . Erythromycin Other (See Comments)    Gi upset/ cramps  . Hctz [Hydrochlorothiazide]     CAUSED GOUT   History   Social History  . Marital Status: Married    Spouse Name: N/A    Number of Children: N/A  . Years of Education: N/A   Occupational History  . Not on file.   Social History Main Topics  . Smoking status: Never Smoker   . Smokeless tobacco: Never Used  . Alcohol Use: No  . Drug Use: No  . Sexual Activity: Not on file   Other Topics Concern  . Not on file   Social History Narrative      Review of Systems  All other systems reviewed and are negative.      Objective:   Physical Exam  Constitutional: She appears well-developed and well-nourished. No distress.  HENT:  Right Ear: External ear normal.  Left Ear: External ear normal.  Nose: Nose normal.  Mouth/Throat: Oropharynx is clear and moist. No oropharyngeal exudate.  Eyes: Conjunctivae are normal.  Neck: Neck supple. No JVD present. No thyromegaly present.  Cardiovascular: Normal rate, regular rhythm, normal  heart sounds and intact distal pulses.   No murmur heard. Pulmonary/Chest: Effort normal and breath sounds normal. No respiratory distress. She has no wheezes. She has no rales.  Abdominal: Soft. Bowel sounds are normal. She exhibits no distension. There is no tenderness. There is no rebound and no guarding.  Musculoskeletal: She exhibits no edema.  Lymphadenopathy:    She has no cervical adenopathy.  Skin: She is not diaphoretic.  Vitals reviewed.         Assessment & Plan:  Diabetes mellitus type II, controlled  Essential hypertension  HLD (hyperlipidemia)  Patient's blood pressure is well controlled. Her LDL cholesterol is well within goal. Her HDL was acceptable and  her triglycerides are good. Her hemoglobin A1c is acceptable at 6.6. Her diabetic foot exam and diabetic eye exam up-to-date. I strongly encouraged 30 minutes aerobic exercise 5 days a week. I also recommended a low carb low saturated fat diet and 5-10 pounds weight loss. Recheck in 6 months or as needed

## 2014-12-18 ENCOUNTER — Other Ambulatory Visit: Payer: Self-pay | Admitting: Family Medicine

## 2015-04-28 ENCOUNTER — Encounter: Payer: Self-pay | Admitting: Family Medicine

## 2015-04-28 ENCOUNTER — Ambulatory Visit (INDEPENDENT_AMBULATORY_CARE_PROVIDER_SITE_OTHER): Payer: 59 | Admitting: Family Medicine

## 2015-04-28 VITALS — BP 122/90 | HR 74 | Temp 98.2°F | Resp 18 | Wt 274.0 lb

## 2015-04-28 DIAGNOSIS — I10 Essential (primary) hypertension: Secondary | ICD-10-CM | POA: Diagnosis not present

## 2015-04-28 DIAGNOSIS — IMO0002 Reserved for concepts with insufficient information to code with codable children: Secondary | ICD-10-CM

## 2015-04-28 DIAGNOSIS — E119 Type 2 diabetes mellitus without complications: Secondary | ICD-10-CM

## 2015-04-28 DIAGNOSIS — E1165 Type 2 diabetes mellitus with hyperglycemia: Secondary | ICD-10-CM

## 2015-04-28 LAB — COMPLETE METABOLIC PANEL WITH GFR
ALBUMIN: 4.3 g/dL (ref 3.5–5.2)
ALT: 24 U/L (ref 0–35)
AST: 16 U/L (ref 0–37)
Alkaline Phosphatase: 79 U/L (ref 39–117)
BUN: 19 mg/dL (ref 6–23)
CO2: 26 mEq/L (ref 19–32)
CREATININE: 0.76 mg/dL (ref 0.50–1.10)
Calcium: 9.2 mg/dL (ref 8.4–10.5)
Chloride: 103 mEq/L (ref 96–112)
GFR, Est African American: >89 mL/min
GFR, Est Non African American: 87 mL/min
Glucose, Bld: 259 mg/dL — ABNORMAL HIGH (ref 70–99)
Potassium: 4.6 mEq/L (ref 3.5–5.3)
Sodium: 139 mEq/L (ref 135–145)
TOTAL PROTEIN: 7 g/dL (ref 6.0–8.3)
Total Bilirubin: 0.8 mg/dL (ref 0.2–1.2)

## 2015-04-28 LAB — HEMOGLOBIN A1C
Hgb A1c MFr Bld: 9.3 % — ABNORMAL HIGH (ref <5.7)
MEAN PLASMA GLUCOSE: 220 mg/dL — AB (ref <117)

## 2015-04-28 LAB — LIPID PANEL
Cholesterol: 152 mg/dL (ref 0–200)
HDL: 48 mg/dL (ref >=46)
LDL CALC: 87 mg/dL (ref 0–99)
Total CHOL/HDL Ratio: 3.2 Ratio
Triglycerides: 84 mg/dL (ref <150)
VLDL: 17 mg/dL (ref 0–40)

## 2015-04-28 MED ORDER — METFORMIN HCL 500 MG PO TABS: 1000.0000 mg | ORAL_TABLET | Freq: Two times a day (BID) | ORAL | Status: DC

## 2015-04-28 NOTE — Progress Notes (Signed)
Subjective:    Patient ID: Caitlyn [name], female    DOB: September 09, 1956, 59 y.o.   MRN: [id number]  HPI Here for follow up of her diabetes.  When I last saw her, her HgA1c was 6.6 diet controlled.  Recently, she has developed polyuria, polydypsia and dizziness.  Over the last 2 days, her sugars have been ranging 280-380.  She had not previously been checking her BS. Past Medical History  Diagnosis Date  . Diabetes mellitus     oral med  . Hypertension   . Reflux occasional    watches diet  . Asthma   . Cold and cough --- no fever  . Complication of anesthesia     24 yrs ago post tubal-- pulm. edema  (no problems post bowel surg. 2009)  . Diverticulitis hx  -- w/ perf. bowel    s/p resection 2009   Past Surgical History  Procedure Laterality Date  . Tubal ligation  24 yrs ago  . Bowel resection  2009    diverticulitis w/ perf. bowel  . Hysteroscopy  10/09/2011    Procedure: HYSTEROSCOPY;  Surgeon: Margarette Asal;  Location: [place];  Service: Gynecology;;  HYSTEROSCOPY D & C   Current Outpatient Prescriptions on File Prior to Visit  Medication Sig Dispense Refill  . albuterol (PROVENTIL HFA;VENTOLIN HFA) 108 (90 BASE) MCG/ACT inhaler Inhale 2 puffs into the lungs every 6 (six) hours as needed.      Marland Kitchen amLODipine (NORVASC) 10 MG tablet Take 1 tablet by mouth  every day 90 tablet 3  . fexofenadine (ALLEGRA) 180 MG tablet Take 180 mg by mouth daily.    . fluticasone (FLONASE) 50 MCG/ACT nasal spray INSTILL 2 SPRAYS IN EACH NOSTRIL EVERY DAY. 48 g 3  . Fluticasone-Salmeterol (ADVAIR) 100-50 MCG/DOSE AEPB Inhale 2 puffs into the lungs 2 (two) times daily.      Marland Kitchen LORazepam (ATIVAN) 1 MG tablet Take 1 tablet (1 mg total) by mouth 2 (two) times daily as needed for anxiety. 30 tablet 0  . losartan (COZAAR) 50 MG tablet Take 1 tablet by mouth  every day 90 tablet 3  . metoprolol succinate (TOPROL-XL) 25 MG 24 hr tablet Take 1 tablet by mouth  every day 90 tablet 3  .  montelukast (SINGULAIR) 10 MG tablet Take 1 tablet (10 mg total) by mouth at bedtime. 90 tablet 4  . montelukast (SINGULAIR) 10 MG tablet Take 1 tablet by mouth at  bedtime 90 tablet 3  . venlafaxine XR (EFFEXOR-XR) 150 MG 24 hr capsule Take 1 capsule by mouth  every day 90 capsule 3   No current facility-administered medications on file prior to visit.   Allergies  Allergen Reactions  . Erythromycin Other (See Comments)    Gi upset/ cramps  . Hctz [Hydrochlorothiazide]     CAUSED GOUT   History   Social History  . Marital Status: Married    Spouse Name: N/A  . Number of Children: N/A  . Years of Education: N/A   Occupational History  . Not on file.   Social History Main Topics  . Smoking status: Never Smoker   . Smokeless tobacco: Never Used  . Alcohol Use: No  . Drug Use: No  . Sexual Activity: Not on file   Other Topics Concern  . Not on file   Social History Narrative      Review of Systems  All other systems reviewed and are negative.  Objective:   Physical Exam  Constitutional: She appears well-developed and well-nourished.  Cardiovascular: Normal rate, regular rhythm, normal heart sounds and intact distal pulses.   No murmur heard. Pulmonary/Chest: Effort normal and breath sounds normal. No respiratory distress. She has no wheezes. She has no rales.  Abdominal: Soft. Bowel sounds are normal. She exhibits no distension. There is no tenderness. There is no rebound and no guarding.  Musculoskeletal: She exhibits no edema.  Vitals reviewed.         Assessment & Plan:  Diabetes mellitus type II, controlled - Plan: COMPLETE METABOLIC PANEL WITH GFR, Hemoglobin A1c, Lipid panel, Microalbumin, urine  Benign essential HTN  Diabetes mellitus type II, uncontrolled - Plan: metFORMIN (GLUCOPHAGE) 500 MG tablet  Begin metformin 1000 bid and check Hga1c.  If greater than 10, I would add glyxambi as well.  Check FLP.  BP is excellent.

## 2015-04-29 LAB — MICROALBUMIN, URINE: Microalb, Ur: 10.9 mg/dL — ABNORMAL HIGH (ref <2.0)

## 2015-05-01 ENCOUNTER — Encounter: Payer: Self-pay | Admitting: Family Medicine

## 2015-05-01 ENCOUNTER — Other Ambulatory Visit: Payer: Self-pay | Admitting: Family Medicine

## 2015-05-01 MED ORDER — EMPAGLIFLOZIN-LINAGLIPTIN 25-5 MG PO TABS: 1.0000 | ORAL_TABLET | Freq: Every day | ORAL | Status: DC

## 2015-05-02 ENCOUNTER — Encounter: Payer: Self-pay | Admitting: Family Medicine

## 2015-05-03 ENCOUNTER — Telehealth: Payer: Self-pay | Admitting: Family Medicine

## 2015-05-03 NOTE — Telephone Encounter (Signed)
LMOVM to check mychart message and if she can not get on mychart she can call me back

## 2015-05-03 NOTE — Telephone Encounter (Signed)
Patient calling for test results [phone] ext. [place]

## 2015-05-15 ENCOUNTER — Telehealth: Payer: Self-pay | Admitting: Family Medicine

## 2015-05-15 NOTE — Telephone Encounter (Signed)
PA requested from pharmacy and had been submitted through [place] [facility]  Case # [id number]

## 2015-05-22 NOTE — Telephone Encounter (Signed)
Insurance has denied Glyxambi.   ([id number])   Pt has been made aware.  Have given her Savings Card from company for 1 free year of [place].

## 2015-05-26 ENCOUNTER — Encounter: Payer: Self-pay | Admitting: Family Medicine

## 2015-05-26 ENCOUNTER — Ambulatory Visit (INDEPENDENT_AMBULATORY_CARE_PROVIDER_SITE_OTHER): Payer: 59 | Admitting: Family Medicine

## 2015-05-26 VITALS — BP 130/90 | HR 78 | Temp 98.3°F | Resp 20 | Wt 270.0 lb

## 2015-05-26 DIAGNOSIS — L409 Psoriasis, unspecified: Secondary | ICD-10-CM

## 2015-05-26 DIAGNOSIS — L723 Sebaceous cyst: Secondary | ICD-10-CM

## 2015-05-26 DIAGNOSIS — L719 Rosacea, unspecified: Secondary | ICD-10-CM | POA: Diagnosis not present

## 2015-05-26 MED ORDER — CEPHALEXIN 500 MG PO CAPS: 500.0000 mg | ORAL_CAPSULE | Freq: Two times a day (BID) | ORAL | Status: DC

## 2015-05-26 MED ORDER — DERMA-SMOOTHE/FS SCALP 0.01 % EX OIL: TOPICAL_OIL | CUTANEOUS | Status: DC

## 2015-05-26 MED ORDER — AZELAIC ACID 15 % EX GEL: CUTANEOUS | Status: DC

## 2015-05-26 NOTE — Patient Instructions (Signed)
Wash scalp with tar shampoo, apply oil and rinse in AM- for 3 weeks Take antibiotics as prescribed Rosacea medication refilled F/U as needed

## 2015-05-26 NOTE — Progress Notes (Signed)
Patient ID: Caitlyn [name], female   DOB: [birth year], 59 y.o.   MRN: [id number]   Subjective:    Patient ID: Caitlyn [name], female    DOB: [birth year], 59 y.o.   MRN: [id number]  Patient presents for OTHER and Rosacea  Patient here with multiple skin concerns. She has history of rosacea however her a topical cream has expired. She also has history of psoriasis of the scalp she's had injections in the past as well as topicals but the itching is very intense right now. She is using at our shampoo but this is not helping as much.  She has a spot on her left lower lip that has been present for the past couple months. She's tried squeezing it and will sometimes get a small amount of white pus out she even tried cutting it with scissors. She would like to have this opened up and drained. He denies any numbness or tingling around it but it is tender to touch   Review Of Systems: per above   GEN- denies fatigue, fever, weight loss,weakness, recent illness HEENT- denies eye drainage, change in vision, nasal discharge, CVS- denies chest pain, palpitations RESP- denies SOB, cough, wheeze ABD- denies N/V, change in stools, abd pain GU- denies dysuria, hematuria, dribbling, incontinence MSK- denies joint pain, muscle aches, injury Neuro- denies headache, dizziness, syncope, seizure activity       Objective:    BP 130/90 mmHg  Pulse 78  Temp(Src) 98.3 F (36.8 C) (Oral)  Resp 20  Wt 270 lb (122.471 kg) GEN- NAD, alert and oriented x3 HEENT- PERRL, EOMI, non injected sclera, pink conjunctiva, MMM, oropharynx clear Neck- Supple, no LAD Skin - mild roscea bialt cheeks, sliver plaques with mild erythema and scales post scalp, left lower lip, small pea size cystic lesion with white pigmentation at [name] \ Procedure- Incision and Drainage Procedure explained to patient questions answered benefits and risks discussed verbal  consent obtained. Antiseptic-Betadine Anesthesia-lidocaine 1% Incision  performed moderate amount of sebaceous substance released Culture taken Minimal blood loss Patient tolerated procedure well Bandage applied        Assessment & Plan:      Problem List Items Addressed This Visit    None    Visit Diagnoses    Rosacea    -  Primary    restart Azelaic acid 15%    Scalp psoriasis        Derma-smoothe treatment, along with Tar shampoo     Sebaceous cyst        s/p I and D tolerated well, as close to oral cavity and pt uncontrolled diabetic, given keflex to cover skin flora       Note: This dictation was prepared with Dragon dictation along with smaller phrase technology. Any transcriptional errors that result from this process are unintentional.

## 2015-06-01 ENCOUNTER — Ambulatory Visit: Payer: 59 | Admitting: Family Medicine

## 2015-06-01 ENCOUNTER — Telehealth: Payer: Self-pay | Admitting: *Deleted

## 2015-06-01 DIAGNOSIS — L723 Sebaceous cyst: Secondary | ICD-10-CM

## 2015-06-01 NOTE — Telephone Encounter (Signed)
Call placed to patient and patient made aware.   Referral orders placed.

## 2015-06-01 NOTE — Telephone Encounter (Signed)
-----   Message from Alycia Rossetti, MD sent at 05/31/2015 11:10 PM EDT ----- Regarding: Lip lesion Call pt, if lesion is back in the same exact spot where we just lanced it, Then she needs to go to dermatology, to have this removed further, lancing again will not help it will just keep coming back.

## 2015-06-02 ENCOUNTER — Encounter: Payer: Self-pay | Admitting: *Deleted

## 2015-06-02 ENCOUNTER — Ambulatory Visit: Payer: 59 | Admitting: Family Medicine

## 2015-06-02 NOTE — Telephone Encounter (Signed)
Thank, agree with referral

## 2015-06-23 ENCOUNTER — Other Ambulatory Visit: Payer: Self-pay | Admitting: Obstetrics and Gynecology

## 2015-06-26 LAB — CYTOLOGY - PAP

## 2015-08-04 ENCOUNTER — Ambulatory Visit: Payer: 59 | Admitting: Family Medicine

## 2015-08-05 ENCOUNTER — Other Ambulatory Visit: Payer: Self-pay | Admitting: Family Medicine

## 2015-08-14 ENCOUNTER — Ambulatory Visit (INDEPENDENT_AMBULATORY_CARE_PROVIDER_SITE_OTHER): Payer: 59 | Admitting: Family Medicine

## 2015-08-14 ENCOUNTER — Encounter: Payer: Self-pay | Admitting: Family Medicine

## 2015-08-14 VITALS — BP 110/80 | HR 108 | Temp 98.2°F | Resp 20 | Ht 67.0 in | Wt 263.0 lb

## 2015-08-14 DIAGNOSIS — J0101 Acute recurrent maxillary sinusitis: Secondary | ICD-10-CM | POA: Diagnosis not present

## 2015-08-14 DIAGNOSIS — J208 Acute bronchitis due to other specified organisms: Secondary | ICD-10-CM | POA: Diagnosis not present

## 2015-08-14 MED ORDER — LEVOFLOXACIN 500 MG PO TABS: 500.0000 mg | ORAL_TABLET | Freq: Every day | ORAL | Status: DC

## 2015-08-14 MED ORDER — PREDNISONE 20 MG PO TABS: 60.0000 mg | ORAL_TABLET | Freq: Every day | ORAL | Status: DC

## 2015-08-14 NOTE — Progress Notes (Signed)
Subjective:    Patient ID: Caitlyn [name], female    DOB: 02-02-1956, 59 y.o.   MRN: [id number]  HPI Patient reports 4 weeks of persistent nasal congestion, sinus pressure and pain, postnasal drip, worsening cough, wheezing, shortness of breath, and chest tightness. Was seen in urgent care earlier this month and was given prednisone, Augmentin, and cough medication. Symptoms are no better. Today on exam she is wheezing audibly. She has it prolonged expiratory phase. She has diminished breath sounds. She continues to have pain and pressure in both maxillary sinuses. She also continues to complain of fatigue and subjective fevers. Past Medical History  Diagnosis Date  . Diabetes mellitus     oral med  . Hypertension   . Reflux occasional    watches diet  . Asthma   . Cold and cough --- no fever  . Complication of anesthesia     24 yrs ago post tubal-- pulm. edema  (no problems post bowel surg. 2009)  . Diverticulitis hx  -- w/ perf. bowel    s/p resection 2009   Past Surgical History  Procedure Laterality Date  . Tubal ligation  24 yrs ago  . Bowel resection  2009    diverticulitis w/ perf. bowel  . Hysteroscopy  10/09/2011    Procedure: HYSTEROSCOPY;  Surgeon: Margarette Asal;  Location: [place];  Service: Gynecology;;  HYSTEROSCOPY D & C   Current Outpatient Prescriptions on File Prior to Visit  Medication Sig Dispense Refill  . albuterol (PROVENTIL HFA;VENTOLIN HFA) 108 (90 BASE) MCG/ACT inhaler Inhale 2 puffs into the lungs every 6 (six) hours as needed.      Marland Kitchen amLODipine (NORVASC) 10 MG tablet Take 1 tablet by mouth  every day 90 tablet 3  . Azelaic Acid 15 % cream Apply to skin twice a day 50 g 2  . Empagliflozin-Linagliptin (GLYXAMBI) 25-5 MG TABS Take 1 tablet by mouth daily. 30 tablet 5  . fexofenadine (ALLEGRA) 180 MG tablet Take 180 mg by mouth daily.    . Fluocinolone Acetonide (DERMA-SMOOTHE/FS SCALP) 0.01 % OIL Apply to scalp after washing, leave  overnight, rinse in AM 1 Bottle 2  . LORazepam (ATIVAN) 1 MG tablet Take 1 tablet (1 mg total) by mouth 2 (two) times daily as needed for anxiety. 30 tablet 0  . losartan (COZAAR) 50 MG tablet Take 1 tablet by mouth  every day 90 tablet 3  . metFORMIN (GLUCOPHAGE) 500 MG tablet TAKE 2 TABLETS(1000 MG) BY MOUTH TWICE DAILY WITH A MEAL 120 tablet 3  . metoprolol succinate (TOPROL-XL) 25 MG 24 hr tablet Take 1 tablet by mouth  every day 90 tablet 3  . montelukast (SINGULAIR) 10 MG tablet Take 1 tablet (10 mg total) by mouth at bedtime. 90 tablet 4  . venlafaxine XR (EFFEXOR-XR) 150 MG 24 hr capsule Take 1 capsule by mouth  every day 90 capsule 3   No current facility-administered medications on file prior to visit.   Allergies  Allergen Reactions  . Erythromycin Other (See Comments)    Gi upset/ cramps  . Hctz [Hydrochlorothiazide]     CAUSED GOUT   Social History   Social History  . Marital Status: Married    Spouse Name: N/A  . Number of Children: N/A  . Years of Education: N/A   Occupational History  . Not on file.   Social History Main Topics  . Smoking status: Never Smoker   . Smokeless tobacco: Never Used  .  Alcohol Use: No  . Drug Use: No  . Sexual Activity: Not on file   Other Topics Concern  . Not on file   Social History Narrative      Review of Systems  All other systems reviewed and are negative.      Objective:   Physical Exam  HENT:  Nose: Mucosal edema present. Right sinus exhibits maxillary sinus tenderness. Left sinus exhibits maxillary sinus tenderness.  Neck: Neck supple.  Cardiovascular: Normal rate, regular rhythm and normal heart sounds.   Pulmonary/Chest: Effort normal. No respiratory distress. She has wheezes. She has no rales. She exhibits no tenderness.  Abdominal: Soft. Bowel sounds are normal.  Lymphadenopathy:    She has no cervical adenopathy.  Vitals reviewed.         Assessment & Plan:  Acute bronchitis due to other  specified organisms - Plan: levofloxacin (LEVAQUIN) 500 MG tablet, predniSONE (DELTASONE) 20 MG tablet  Acute recurrent maxillary sinusitis  Begin Levaquin 500 mg by mouth daily for 7 days for bronchitis and sinusitis. Begin prednisone 60 mg by mouth daily for 6 days for an asthma exacerbation and bronchitis. Continue albuterol as needed. Add Symbicort 160/4.5, 2 puffs inhaled twice a day. Recheck in 10 days if no better or sooner if worse

## 2015-08-21 ENCOUNTER — Telehealth: Payer: Self-pay | Admitting: Family Medicine

## 2015-08-21 ENCOUNTER — Ambulatory Visit
Admission: RE | Admit: 2015-08-21 | Discharge: 2015-08-21 | Disposition: A | Discharge status: Home / Self Care | Payer: 59 | Source: Ambulatory Visit | Attending: Family Medicine | Admitting: Family Medicine

## 2015-08-21 DIAGNOSIS — R062 Wheezing: Secondary | ICD-10-CM

## 2015-08-21 NOTE — Telephone Encounter (Signed)
Pleas get cxr today and can she come by tomorrow to be seen.

## 2015-08-21 NOTE — Telephone Encounter (Signed)
Patient is calling to say she is not much better would like a call back asap if possible  [phone] (W ext is [phone]

## 2015-08-21 NOTE — Telephone Encounter (Signed)
Pt aware and entered ordered and appt made

## 2015-08-22 ENCOUNTER — Ambulatory Visit (INDEPENDENT_AMBULATORY_CARE_PROVIDER_SITE_OTHER): Payer: 59 | Admitting: Family Medicine

## 2015-08-22 ENCOUNTER — Encounter: Payer: Self-pay | Admitting: Family Medicine

## 2015-08-22 ENCOUNTER — Other Ambulatory Visit: Payer: Self-pay | Admitting: Family Medicine

## 2015-08-22 VITALS — BP 122/70 | HR 98 | Temp 98.0°F | Resp 18 | Ht 67.0 in | Wt 264.0 lb

## 2015-08-22 DIAGNOSIS — R5383 Other fatigue: Secondary | ICD-10-CM

## 2015-08-22 DIAGNOSIS — R000 Tachycardia, unspecified: Secondary | ICD-10-CM

## 2015-08-22 DIAGNOSIS — R0600 Dyspnea, unspecified: Secondary | ICD-10-CM | POA: Diagnosis not present

## 2015-08-22 LAB — CBC W/MCH & 3 PART DIFF
HCT: 41.5 % (ref 36.0–46.0)
HEMOGLOBIN: 14 g/dL (ref 12.0–15.0)
LYMPHS ABS: 2.4 10*3/uL (ref 0.7–4.0)
LYMPHS PCT: 27 % (ref 12–46)
MCH: 30.4 pg (ref 26.0–34.0)
MCHC: 33.7 g/dL (ref 30.0–36.0)
MCV: 90 fL (ref 78.0–100.0)
Neutro Abs: 5.9 10*3/uL (ref 1.7–7.7)
Neutrophils Relative %: 66 % (ref 43–77)
PLATELETS: 292 10*3/uL (ref 150–400)
RBC: 4.61 MIL/uL (ref 3.87–5.11)
RDW: 13.4 % (ref 11.5–15.5)
WBC mixed population %: 7 % (ref 3–18)
WBC: 8.9 10*3/uL (ref 4.0–10.5)
WBCMIX: 0.6 10*3/uL (ref 0.1–1.8)

## 2015-08-22 LAB — COMPLETE METABOLIC PANEL WITH GFR
ALBUMIN: 4 g/dL (ref 3.6–5.1)
ALK PHOS: 57 U/L (ref 33–130)
ALT: 25 U/L (ref 6–29)
AST: 15 U/L (ref 10–35)
BILIRUBIN TOTAL: 0.8 mg/dL (ref 0.2–1.2)
BUN: 25 mg/dL (ref 7–25)
CO2: 28 mmol/L (ref 20–31)
CREATININE: 1.01 mg/dL (ref 0.50–1.05)
Calcium: 9.6 mg/dL (ref 8.6–10.4)
Chloride: 101 mmol/L (ref 98–110)
GFR, EST NON AFRICAN AMERICAN: 62 mL/min (ref >=60)
GFR, Est African American: 71 mL/min (ref >=60)
GLUCOSE: 265 mg/dL — AB (ref 70–99)
Potassium: 4.5 mmol/L (ref 3.5–5.3)
SODIUM: 139 mmol/L (ref 135–146)
Total Protein: 6.4 g/dL (ref 6.1–8.1)

## 2015-08-22 NOTE — Progress Notes (Signed)
Subjective:    Patient ID: Caitlyn [name], female    DOB: [birth year], 59 y.o.   MRN: [id number]  HPI 08/14/15 Patient reports 4 weeks of persistent nasal congestion, sinus pressure and pain, postnasal drip, worsening cough, wheezing, shortness of breath, and chest tightness. Was seen in urgent care earlier this month and was given prednisone, Augmentin, and cough medication. Symptoms are no better. Today on exam she is wheezing audibly. She has a prolonged expiratory phase. She has diminished breath sounds. She continues to have pain and pressure in both maxillary sinuses. She also continues to complain of fatigue and subjective fevers.  At that time, my plan was: Begin Levaquin 500 mg by mouth daily for 7 days for bronchitis and sinusitis. Begin prednisone 60 mg by mouth daily for 6 days for an asthma exacerbation and bronchitis. Continue albuterol as needed. Add Symbicort 160/4.5, 2 puffs inhaled twice a day. Recheck in 10 days if no better or sooner if worse  08/22/15 Patient called yesterday and stated that her symptoms are no better.  CXR obtained yesterday was clear.  She is here today to discuss further. Actually her cough and wheezing has subsided. On examination today her lungs are clear to auscultation bilaterally without wheezing. Her sinus pressure and pain is better however she is still profoundly short of breath and reports significant dyspnea on exertion, significant fatigue and chest tightness. Patient's oxygen saturation is 95% on room air at rest. With ambulation it drops as low as 88%. Her heart rate rises rapidly to 130 bpm with minimal exertion. While her pulmonary exam has improved from 1 week ago, I do believe I hear a slight friction rub on her cardiovascular exam Past Medical History  Diagnosis Date  . Diabetes mellitus     oral med  . Hypertension   . Reflux occasional    watches diet  . Asthma   . Cold and cough --- no fever  . Complication of anesthesia     24 yrs ago  post tubal-- pulm. edema  (no problems post bowel surg. 2009)  . Diverticulitis hx  -- w/ perf. bowel    s/p resection 2009   Past Surgical History  Procedure Laterality Date  . Tubal ligation  24 yrs ago  . Bowel resection  2009    diverticulitis w/ perf. bowel  . Hysteroscopy  10/09/2011    Procedure: HYSTEROSCOPY;  Surgeon: Margarette Asal;  Location: [place];  Service: Gynecology;;  HYSTEROSCOPY D & C   Current Outpatient Prescriptions on File Prior to Visit  Medication Sig Dispense Refill  . albuterol (PROVENTIL HFA;VENTOLIN HFA) 108 (90 BASE) MCG/ACT inhaler Inhale 2 puffs into the lungs every 6 (six) hours as needed.      Marland Kitchen amLODipine (NORVASC) 10 MG tablet Take 1 tablet by mouth  every day 90 tablet 3  . Azelaic Acid 15 % cream Apply to skin twice a day 50 g 2  . Empagliflozin-Linagliptin (GLYXAMBI) 25-5 MG TABS Take 1 tablet by mouth daily. 30 tablet 5  . fexofenadine (ALLEGRA) 180 MG tablet Take 180 mg by mouth daily.    . Fluocinolone Acetonide (DERMA-SMOOTHE/FS SCALP) 0.01 % OIL Apply to scalp after washing, leave overnight, rinse in AM 1 Bottle 2  . levofloxacin (LEVAQUIN) 500 MG tablet Take 1 tablet (500 mg total) by mouth daily. 7 tablet 0  . LORazepam (ATIVAN) 1 MG tablet Take 1 tablet (1 mg total) by mouth 2 (two) times daily as needed  for anxiety. 30 tablet 0  . losartan (COZAAR) 50 MG tablet Take 1 tablet by mouth  every day 90 tablet 3  . metFORMIN (GLUCOPHAGE) 500 MG tablet TAKE 2 TABLETS(1000 MG) BY MOUTH TWICE DAILY WITH A MEAL 120 tablet 3  . metoprolol succinate (TOPROL-XL) 25 MG 24 hr tablet Take 1 tablet by mouth  every day 90 tablet 3  . montelukast (SINGULAIR) 10 MG tablet Take 1 tablet (10 mg total) by mouth at bedtime. 90 tablet 4  . predniSONE (DELTASONE) 20 MG tablet Take 3 tablets (60 mg total) by mouth daily with breakfast. 18 tablet 0  . venlafaxine XR (EFFEXOR-XR) 150 MG 24 hr capsule Take 1 capsule by mouth  every day 90 capsule 3     No current facility-administered medications on file prior to visit.   Allergies  Allergen Reactions  . Erythromycin Other (See Comments)    Gi upset/ cramps  . Hctz [Hydrochlorothiazide]     CAUSED GOUT   Social History   Social History  . Marital Status: Married    Spouse Name: N/A  . Number of Children: N/A  . Years of Education: N/A   Occupational History  . Not on file.   Social History Main Topics  . Smoking status: Never Smoker   . Smokeless tobacco: Never Used  . Alcohol Use: No  . Drug Use: No  . Sexual Activity: Not on file   Other Topics Concern  . Not on file   Social History Narrative      Review of Systems  All other systems reviewed and are negative.      Objective:   Physical Exam  Constitutional: She appears well-developed and well-nourished.  HENT:  Nose: No mucosal edema. Right sinus exhibits no maxillary sinus tenderness. Left sinus exhibits no maxillary sinus tenderness.  Neck: Neck supple.  Cardiovascular: Regular rhythm.  Tachycardia present.  Exam reveals friction rub.   Pulmonary/Chest: Effort normal. No respiratory distress. She has no wheezes. She has no rales. She exhibits no tenderness.  Abdominal: Soft. Bowel sounds are normal.  Lymphadenopathy:    She has no cervical adenopathy.  Vitals reviewed.         Assessment & Plan:  Dyspnea - Plan: CBC w/MCH & 3 Part Diff, COMPLETE METABOLIC PANEL WITH GFR, Brain natriuretic peptide, EKG 12-Lead, CT Angio Chest W/Cm &/Or Wo Cm  Tachycardia - Plan: CT Angio Chest W/Cm &/Or Wo Cm  Other fatigue - Plan: CT Angio Chest W/Cm &/Or Wo Cm  Patient's asthma exacerbation seems to have resolved. I can appreciate no further wheezing and her cough is better. Furthermore her sinuses appear to be better. However she appears to have a friction rub on her exam, she has hypoxia with minimal exertion and she also has significant sinus tachycardia with minimal exertion. Therefore I believe there  is something much more insidious than an asthma exacerbation and sinusitis. Differential diagnosis includes viral cardiomyopathy, pericarditis, pulmonary embolism, anemia. Therefore, I will obtain an EKG of the heart today to evaluate for signs of pericarditis. I will schedule the patient for a CT angiogram of the chest rule out pulmonary embolism. I will obtain a CBC to evaluate for anemia. I will also schedule the patient for an echocardiogram of the heart to evaluate for cardiomyopathy.  EKG shows sinus rhythm at 94 bpm. There is mild ST segment elevation consistent with early repolarization in the lateral leads. It does not appear to be pericarditis. It also does not appear to  be ischemic.  CBC is nml.  Proceed with CT angio.

## 2015-08-23 ENCOUNTER — Other Ambulatory Visit: Payer: Self-pay | Admitting: Family Medicine

## 2015-08-23 ENCOUNTER — Ambulatory Visit
Admission: RE | Admit: 2015-08-23 | Discharge: 2015-08-23 | Disposition: A | Discharge status: Home / Self Care | Payer: 59 | Source: Ambulatory Visit | Attending: Family Medicine | Admitting: Family Medicine

## 2015-08-23 DIAGNOSIS — R000 Tachycardia, unspecified: Secondary | ICD-10-CM

## 2015-08-23 DIAGNOSIS — R5383 Other fatigue: Secondary | ICD-10-CM

## 2015-08-23 DIAGNOSIS — R0600 Dyspnea, unspecified: Secondary | ICD-10-CM

## 2015-08-23 LAB — BRAIN NATRIURETIC PEPTIDE: Brain Natriuretic Peptide: 30.5 pg/mL (ref 0.0–100.0)

## 2015-08-23 MED ORDER — IOPAMIDOL (ISOVUE-370) INJECTION 76%
100.0000 mL | Freq: Once | INTRAVENOUS | Status: AC | PRN
  Administered 2015-08-23: 100 mL via INTRAVENOUS

## 2015-08-24 LAB — HEMOGLOBIN A1C
Hgb A1c MFr Bld: 6.8 % — ABNORMAL HIGH (ref <5.7)
MEAN PLASMA GLUCOSE: 148 mg/dL — AB (ref <117)

## 2015-08-25 ENCOUNTER — Encounter: Payer: Self-pay | Admitting: Family Medicine

## 2015-08-25 ENCOUNTER — Telehealth: Payer: Self-pay | Admitting: Family Medicine

## 2015-08-25 NOTE — Telephone Encounter (Signed)
Pt called and states that she doesn't want [place] to schedule her for an electrocardiogram because she is feeling better.  You can call her if needed at [phone]

## 2015-08-28 NOTE — Telephone Encounter (Signed)
ok 

## 2015-09-01 ENCOUNTER — Ambulatory Visit (HOSPITAL_COMMUNITY): Payer: 59 | Attending: Cardiovascular Disease

## 2015-09-01 ENCOUNTER — Other Ambulatory Visit: Payer: Self-pay

## 2015-09-01 DIAGNOSIS — E119 Type 2 diabetes mellitus without complications: Secondary | ICD-10-CM | POA: Insufficient documentation

## 2015-09-01 DIAGNOSIS — I10 Essential (primary) hypertension: Secondary | ICD-10-CM | POA: Diagnosis not present

## 2015-09-01 DIAGNOSIS — R0600 Dyspnea, unspecified: Secondary | ICD-10-CM

## 2015-09-01 DIAGNOSIS — R000 Tachycardia, unspecified: Secondary | ICD-10-CM | POA: Diagnosis not present

## 2015-09-01 DIAGNOSIS — R5383 Other fatigue: Secondary | ICD-10-CM

## 2015-10-02 ENCOUNTER — Ambulatory Visit (INDEPENDENT_AMBULATORY_CARE_PROVIDER_SITE_OTHER): Payer: 59 | Admitting: Family Medicine

## 2015-10-02 ENCOUNTER — Ambulatory Visit
Admission: RE | Admit: 2015-10-02 | Discharge: 2015-10-02 | Disposition: A | Discharge status: Home / Self Care | Payer: 59 | Source: Ambulatory Visit | Attending: Family Medicine | Admitting: Family Medicine

## 2015-10-02 ENCOUNTER — Other Ambulatory Visit: Payer: Self-pay | Admitting: Family Medicine

## 2015-10-02 ENCOUNTER — Encounter: Payer: Self-pay | Admitting: Family Medicine

## 2015-10-02 VITALS — BP 120/80 | HR 70 | Temp 98.1°F | Resp 18 | Wt 273.0 lb

## 2015-10-02 DIAGNOSIS — S99921A Unspecified injury of right foot, initial encounter: Secondary | ICD-10-CM

## 2015-10-02 DIAGNOSIS — S93601A Unspecified sprain of right foot, initial encounter: Secondary | ICD-10-CM

## 2015-10-02 DIAGNOSIS — N390 Urinary tract infection, site not specified: Secondary | ICD-10-CM

## 2015-10-02 LAB — URINALYSIS, ROUTINE W REFLEX MICROSCOPIC
BILIRUBIN URINE: NEGATIVE
Glucose, UA: NEGATIVE
Hgb urine dipstick: NEGATIVE
Ketones, ur: NEGATIVE
Leukocytes, UA: NEGATIVE
Nitrite: NEGATIVE
pH: 5.5 (ref 5.0–8.0)

## 2015-10-02 LAB — URINALYSIS, MICROSCOPIC ONLY
CRYSTALS: NONE SEEN [HPF]
Casts: NONE SEEN [LPF]
Yeast: NONE SEEN [HPF]

## 2015-10-02 NOTE — Progress Notes (Signed)
Subjective:    Patient ID: Caitlyn [name], female    DOB: Aug 16, 1956, 59 y.o.   MRN: [id number]  HPI  2 weeks ago, the patient fell down some steps at home. Ever since that time she reports pain over the second third and fourth metatarsals and also over the second through fourth MTP joints. X-ray obtained today of the right foot reveals no cute fracture or bony abnormality. On exam there is no swelling. There is no ecchymosis There is some minimal tenderness to palpation of the metatarsal heads bilaterally. She also complains of several days of polyuria, foul-smelling urine, and some burning with urination Past Medical History  Diagnosis Date  . Diabetes mellitus     oral med  . Hypertension   . Reflux occasional    watches diet  . Asthma   . Cold and cough --- no fever  . Complication of anesthesia     24 yrs ago post tubal-- pulm. edema  (no problems post bowel surg. 2009)  . Diverticulitis hx  -- w/ perf. bowel    s/p resection 2009   Past Surgical History  Procedure Laterality Date  . Tubal ligation  24 yrs ago  . Bowel resection  2009    diverticulitis w/ perf. bowel  . Hysteroscopy  10/09/2011    Procedure: HYSTEROSCOPY;  Surgeon: Margarette Asal;  Location: [place];  Service: Gynecology;;  HYSTEROSCOPY D & C   Current Outpatient Prescriptions on File Prior to Visit  Medication Sig Dispense Refill  . albuterol (PROVENTIL HFA;VENTOLIN HFA) 108 (90 BASE) MCG/ACT inhaler Inhale 2 puffs into the lungs every 6 (six) hours as needed.      Marland Kitchen amLODipine (NORVASC) 10 MG tablet Take 1 tablet by mouth  every day 90 tablet 3  . Azelaic Acid 15 % cream Apply to skin twice a day 50 g 2  . Empagliflozin-Linagliptin (GLYXAMBI) 25-5 MG TABS Take 1 tablet by mouth daily. 30 tablet 5  . fexofenadine (ALLEGRA) 180 MG tablet Take 180 mg by mouth daily.    . Fluocinolone Acetonide (DERMA-SMOOTHE/FS SCALP) 0.01 % OIL Apply to scalp after washing, leave overnight, rinse in AM 1  Bottle 2  . LORazepam (ATIVAN) 1 MG tablet Take 1 tablet (1 mg total) by mouth 2 (two) times daily as needed for anxiety. 30 tablet 0  . losartan (COZAAR) 50 MG tablet Take 1 tablet by mouth  every day 90 tablet 3  . metFORMIN (GLUCOPHAGE) 500 MG tablet TAKE 2 TABLETS(1000 MG) BY MOUTH TWICE DAILY WITH A MEAL 120 tablet 3  . metoprolol succinate (TOPROL-XL) 25 MG 24 hr tablet Take 1 tablet by mouth  every day 90 tablet 3  . montelukast (SINGULAIR) 10 MG tablet Take 1 tablet (10 mg total) by mouth at bedtime. 90 tablet 4  . venlafaxine XR (EFFEXOR-XR) 150 MG 24 hr capsule Take 1 capsule by mouth  every day 90 capsule 3   No current facility-administered medications on file prior to visit.   Allergies  Allergen Reactions  . Erythromycin Other (See Comments)    Gi upset/ cramps  . Hctz [Hydrochlorothiazide]     CAUSED GOUT   .som    Review of Systems  All other systems reviewed and are negative.      Objective:   Physical Exam  Constitutional: She appears well-developed and well-nourished.  Cardiovascular: Normal rate, regular rhythm and normal heart sounds.   No murmur heard. Pulmonary/Chest: Effort normal and breath sounds normal.  Abdominal: Soft. Bowel sounds are normal.  Musculoskeletal:       Right foot: There is tenderness. There is normal range of motion, no bony tenderness, no swelling, no crepitus, no deformity and no laceration.  Vitals reviewed.         Assessment & Plan:  Urinary tract infection without hematuria, site unspecified - Plan: Urinalysis, Routine w reflex microscopic (not at [facility])  Right foot sprain, initial encounter   I believe the patient sprained her forefoot during the fall. There is no evidence of fracture. I recommended rest ice elevation and ibuprofen 800 mg every 8 hours. Weightbearing as tolerated. Wear good supportive shoes with a rigid sole such as a postop shoe. I anticipate gradual improvement over the next 2-4 weeks. MRI pain is  persistent.   urinalysis is negative. I recommended the patient start checking her sugars to  Determine if polyuria secondary to hyperglycemia. Consider treatment for overactive bladder  If symptoms persist

## 2015-10-30 ENCOUNTER — Other Ambulatory Visit: Payer: Self-pay | Admitting: Family Medicine

## 2015-11-13 ENCOUNTER — Other Ambulatory Visit: Payer: Self-pay | Admitting: Family Medicine

## 2015-11-22 ENCOUNTER — Telehealth: Payer: Self-pay | Admitting: Family Medicine

## 2015-11-22 MED ORDER — PERMETHRIN 5 % EX CREA: 1.0000 "application " | TOPICAL_CREAM | Freq: Once | CUTANEOUS | Status: DC

## 2015-11-22 NOTE — Telephone Encounter (Signed)
Pt called and states that granddaughter has scabies and while she was visiting with them now her and her husband have them and would like to know if something can be called in for them as well?  Per [facility] ok to send in rx - rx sent to requested pharm and if this does not help will need to be seen

## 2016-01-22 ENCOUNTER — Other Ambulatory Visit: Payer: Self-pay | Admitting: Family Medicine

## 2016-03-18 ENCOUNTER — Other Ambulatory Visit: Payer: Self-pay | Admitting: Family Medicine

## 2016-03-19 ENCOUNTER — Other Ambulatory Visit: Payer: Self-pay | Admitting: Family Medicine

## 2016-05-23 ENCOUNTER — Encounter (INDEPENDENT_AMBULATORY_CARE_PROVIDER_SITE_OTHER): Payer: Self-pay

## 2016-05-23 DIAGNOSIS — N3001 Acute cystitis with hematuria: Secondary | ICD-10-CM

## 2016-05-28 ENCOUNTER — Other Ambulatory Visit: Payer: Self-pay | Admitting: Family Medicine

## 2016-09-05 ENCOUNTER — Other Ambulatory Visit: Payer: Self-pay | Admitting: Family Medicine

## 2016-09-23 ENCOUNTER — Ambulatory Visit (INDEPENDENT_AMBULATORY_CARE_PROVIDER_SITE_OTHER): Payer: 59 | Admitting: Family Medicine

## 2016-09-23 ENCOUNTER — Encounter: Payer: Self-pay | Admitting: Family Medicine

## 2016-09-23 VITALS — BP 130/78 | HR 88 | Temp 98.3°F | Resp 18 | Wt 256.0 lb

## 2016-09-23 DIAGNOSIS — K5900 Constipation, unspecified: Secondary | ICD-10-CM | POA: Diagnosis not present

## 2016-09-23 DIAGNOSIS — R140 Abdominal distension (gaseous): Secondary | ICD-10-CM

## 2016-09-23 NOTE — Progress Notes (Signed)
Subjective:    Patient ID: Caitlyn [name], female    DOB: 16-Jan-1956, 60 y.o.   MRN: [id number]  HPI  Patient says with increased abdominal bloating, gaseous distention, belching, and flatus over the last few weeks. She states that she is only going to the bathroom one or 2 times a week. Occasionally the abdominal distention can become quite painful. She denies any fevers nausea or vomiting. She denies any blood in her stool or melena. Past medical history is significant for Crohn's disease. She is also had surgery and had a portion of her colon removed. However today there is no evidence of a bowel obstruction. Her abdomen is soft nondistended nontender with normal bowel sounds. Past Surgical History:  Procedure Laterality Date  . BOWEL RESECTION  2009   diverticulitis w/ perf. bowel  . HYSTEROSCOPY  10/09/2011   Procedure: HYSTEROSCOPY;  Surgeon: Margarette Asal;  Location: [facility];  Service: Gynecology;;  HYSTEROSCOPY D & C  . TUBAL LIGATION  24 yrs ago   Current Outpatient Prescriptions on File Prior to Visit  Medication Sig Dispense Refill  . albuterol (PROVENTIL HFA;VENTOLIN HFA) 108 (90 BASE) MCG/ACT inhaler Inhale 2 puffs into the lungs every 6 (six) hours as needed.      Marland Kitchen amLODipine (NORVASC) 10 MG tablet Take 1 tablet by mouth  every day 90 tablet 3  . Empagliflozin-Linagliptin (GLYXAMBI) 25-5 MG TABS Take 1 tablet by mouth daily. 30 tablet 5  . fexofenadine (ALLEGRA) 180 MG tablet Take 180 mg by mouth daily.    Marland Kitchen LORazepam (ATIVAN) 1 MG tablet Take 1 tablet (1 mg total) by mouth 2 (two) times daily as needed for anxiety. 30 tablet 0  . losartan (COZAAR) 50 MG tablet Take 1 tablet by mouth  every day 90 tablet 3  . metFORMIN (GLUCOPHAGE) 500 MG tablet TAKE 2 TABLETS BY MOUTH TWICE DAILY WITH A MEAL 360 tablet 0  . metoprolol succinate (TOPROL-XL) 25 MG 24 hr tablet Take 1 tablet by mouth  every day 90 tablet 3  . montelukast (SINGULAIR) 10 MG tablet Take 1  tablet by mouth at  bedtime 90 tablet 3  . venlafaxine XR (EFFEXOR-XR) 150 MG 24 hr capsule Take 1 capsule by mouth  every day 90 capsule 3  . Azelaic Acid 15 % cream Apply to skin twice a day (Patient not taking: Reported on 09/23/2016) 50 g 2  . Fluocinolone Acetonide (DERMA-SMOOTHE/FS SCALP) 0.01 % OIL Apply to scalp after washing, leave overnight, rinse in AM (Patient not taking: Reported on 09/23/2016) 1 Bottle 2   No current facility-administered medications on file prior to visit.    Allergies  Allergen Reactions  . Erythromycin Other (See Comments)    Gi upset/ cramps  . Hctz [Hydrochlorothiazide]     CAUSED GOUT   Social History   Social History  . Marital status: Married    Spouse name: N/A  . Number of children: N/A  . Years of education: N/A   Occupational History  . Not on file.   Social History Main Topics  . Smoking status: Never Smoker  . Smokeless tobacco: Never Used  . Alcohol use No  . Drug use: No  . Sexual activity: Not on file   Other Topics Concern  . Not on file   Social History Narrative  . No narrative on file    .pmh  Review of Systems  All other systems reviewed and are negative.  Objective:   Physical Exam  Cardiovascular: Normal rate, regular rhythm, normal heart sounds and intact distal pulses.   Pulmonary/Chest: Effort normal and breath sounds normal. No respiratory distress. She has no wheezes. She has no rales.  Abdominal: Soft. Bowel sounds are normal. She exhibits no distension. There is no tenderness. There is no rebound and no guarding.  Vitals reviewed.           A+P  I believe the patient has abdominal bloating and distention secondary to constipation and possible IBS. Begin Amitiza 24 g by mouth twice a day.  If symptoms do not improve within 1 week, consider starting the patient on a probiotic. Also recommended that the patient return fasting for a hemoglobin A1c, fasting lipid panel, CMP, and urine  microalbumin she is long overdue to reassess management of her diabetes

## 2016-09-27 ENCOUNTER — Other Ambulatory Visit: Payer: 59

## 2016-09-27 ENCOUNTER — Other Ambulatory Visit: Payer: Self-pay | Admitting: Family Medicine

## 2016-09-27 DIAGNOSIS — I152 Hypertension secondary to endocrine disorders: Secondary | ICD-10-CM | POA: Insufficient documentation

## 2016-09-27 DIAGNOSIS — IMO0002 Reserved for concepts with insufficient information to code with codable children: Secondary | ICD-10-CM | POA: Insufficient documentation

## 2016-09-27 DIAGNOSIS — Z79899 Other long term (current) drug therapy: Secondary | ICD-10-CM

## 2016-09-27 DIAGNOSIS — IMO0001 Reserved for inherently not codable concepts without codable children: Secondary | ICD-10-CM

## 2016-09-27 DIAGNOSIS — I10 Essential (primary) hypertension: Secondary | ICD-10-CM

## 2016-09-27 DIAGNOSIS — J45909 Unspecified asthma, uncomplicated: Secondary | ICD-10-CM | POA: Insufficient documentation

## 2016-09-27 DIAGNOSIS — E1165 Type 2 diabetes mellitus with hyperglycemia: Principal | ICD-10-CM

## 2016-09-27 DIAGNOSIS — Z8719 Personal history of other diseases of the digestive system: Secondary | ICD-10-CM

## 2016-09-27 LAB — CBC WITH DIFFERENTIAL/PLATELET
BASOS ABS: 0 {cells}/uL (ref 0–200)
Basophils Relative: 0 %
EOS PCT: 3 %
Eosinophils Absolute: 201 cells/uL (ref 15–500)
HCT: 40.2 % (ref 35.0–45.0)
Hemoglobin: 13.7 g/dL (ref 12.0–15.0)
LYMPHS PCT: 36 %
Lymphs Abs: 2412 cells/uL (ref 850–3900)
MCH: 29.7 pg (ref 27.0–33.0)
MCHC: 34.1 g/dL (ref 32.0–36.0)
MCV: 87.2 fL (ref 80.0–100.0)
MPV: 10 fL (ref 7.5–12.5)
Monocytes Absolute: 536 cells/uL (ref 200–950)
Monocytes Relative: 8 %
NEUTROS PCT: 53 %
Neutro Abs: 3551 cells/uL (ref 1500–7800)
Platelets: 321 10*3/uL (ref 140–400)
RBC: 4.61 MIL/uL (ref 3.80–5.10)
RDW: 13.1 % (ref 11.0–15.0)
WBC: 6.7 10*3/uL (ref 3.8–10.8)

## 2016-09-27 LAB — HEMOGLOBIN A1C
HEMOGLOBIN A1C: 5.5 % (ref <5.7)
MEAN PLASMA GLUCOSE: 111 mg/dL

## 2016-09-28 LAB — LIPID PANEL
CHOL/HDL RATIO: 3.6 ratio (ref <=5.0)
Cholesterol: 176 mg/dL (ref 125–200)
HDL: 49 mg/dL (ref >=46)
LDL CALC: 107 mg/dL (ref <130)
TRIGLYCERIDES: 99 mg/dL (ref <150)
VLDL: 20 mg/dL (ref <30)

## 2016-09-28 LAB — COMPLETE METABOLIC PANEL WITH GFR
ALT: 21 U/L (ref 6–29)
AST: 16 U/L (ref 10–35)
Albumin: 4.4 g/dL (ref 3.6–5.1)
Alkaline Phosphatase: 61 U/L (ref 33–130)
BUN: 23 mg/dL (ref 7–25)
CHLORIDE: 105 mmol/L (ref 98–110)
CO2: 22 mmol/L (ref 20–31)
Calcium: 9.8 mg/dL (ref 8.6–10.4)
Creat: 0.99 mg/dL (ref 0.50–1.05)
GFR, EST NON AFRICAN AMERICAN: 63 mL/min (ref >=60)
GFR, Est African American: 72 mL/min (ref >=60)
GLUCOSE: 134 mg/dL — AB (ref 70–99)
Potassium: 5.2 mmol/L (ref 3.5–5.3)
Sodium: 141 mmol/L (ref 135–146)
TOTAL PROTEIN: 7.1 g/dL (ref 6.1–8.1)
Total Bilirubin: 0.5 mg/dL (ref 0.2–1.2)

## 2016-09-30 ENCOUNTER — Encounter: Payer: Self-pay | Admitting: Family Medicine

## 2016-10-04 ENCOUNTER — Other Ambulatory Visit: Payer: Self-pay | Admitting: Family Medicine

## 2016-10-15 ENCOUNTER — Telehealth: Payer: Self-pay | Admitting: Family Medicine

## 2016-10-15 DIAGNOSIS — K5909 Other constipation: Secondary | ICD-10-CM

## 2016-10-15 NOTE — Telephone Encounter (Signed)
Patient is calling regarding her last visit on October 30th, still having the same issues even after taking probiotics, would like to know what she could do next [phone]

## 2016-10-16 NOTE — Telephone Encounter (Signed)
Pt states that the Amitiza did the same thing as miralax and it helped but she would like to see a GI. Referral placed.

## 2016-10-16 NOTE — Telephone Encounter (Signed)
Did she not see any benefit from [place] either?  If not, I feel we should get her in to see GI to see if this could be related to inflammatory bowel disease (ie Crohns).

## 2016-11-07 ENCOUNTER — Ambulatory Visit (INDEPENDENT_AMBULATORY_CARE_PROVIDER_SITE_OTHER): Payer: 59 | Admitting: Physician Assistant

## 2016-11-07 ENCOUNTER — Encounter: Payer: Self-pay | Admitting: Physician Assistant

## 2016-11-07 VITALS — BP 134/86 | HR 99 | Temp 98.4°F | Resp 18 | Wt 248.0 lb

## 2016-11-07 DIAGNOSIS — N390 Urinary tract infection, site not specified: Secondary | ICD-10-CM | POA: Diagnosis not present

## 2016-11-07 DIAGNOSIS — R300 Dysuria: Secondary | ICD-10-CM | POA: Diagnosis not present

## 2016-11-07 LAB — URINALYSIS, ROUTINE W REFLEX MICROSCOPIC
Bilirubin Urine: NEGATIVE
Glucose, UA: NEGATIVE
HGB URINE DIPSTICK: NEGATIVE
NITRITE: NEGATIVE
Specific Gravity, Urine: >1.03 (ref 1.001–1.035)
pH: 5.5 (ref 5.0–8.0)

## 2016-11-07 LAB — URINALYSIS, MICROSCOPIC ONLY
Crystals: NONE SEEN [HPF]
RBC / HPF: NONE SEEN RBC/HPF (ref <=2)
Yeast: NONE SEEN [HPF]

## 2016-11-07 MED ORDER — NITROFURANTOIN MONOHYD MACRO 100 MG PO CAPS: 100.0000 mg | ORAL_CAPSULE | Freq: Two times a day (BID) | ORAL | 0 refills | Status: DC

## 2016-11-07 NOTE — Progress Notes (Signed)
Patient ID: Caitlyn [name] MRN: [id number], DOB: [birth year], 60 y.o. Date of Encounter: 11/07/2016, 2:48 PM    Chief Complaint:  Chief Complaint  Patient presents with  . Urinary Frequency  . odor  . burning urination     HPI: 60 y.o. year old female presetns with above.   Says that she is having significant urinary frequency. Having to go to the bathroom very frequently that but then only very small amount of urine comes out. Also is having some burning when she urinates. Has had no back pain. No fevers or chills. No vaginal discharge or vaginal itching or irritation.     Home Meds:   Outpatient Medications Prior to Visit  Medication Sig Dispense Refill  . albuterol (PROVENTIL HFA;VENTOLIN HFA) 108 (90 BASE) MCG/ACT inhaler Inhale 2 puffs into the lungs every 6 (six) hours as needed.      Marland Kitchen amLODipine (NORVASC) 10 MG tablet Take 1 tablet by mouth  every day 90 tablet 3  . fexofenadine (ALLEGRA) 180 MG tablet Take 180 mg by mouth daily.    Marland Kitchen HUMIRA PEN 40 MG/0.8ML PNKT     . LORazepam (ATIVAN) 1 MG tablet Take 1 tablet (1 mg total) by mouth 2 (two) times daily as needed for anxiety. 30 tablet 0  . losartan (COZAAR) 50 MG tablet Take 1 tablet by mouth  every day 90 tablet 3  . metFORMIN (GLUCOPHAGE) 500 MG tablet TAKE 2 TABLETS BY MOUTH TWICE DAILY WITH A MEAL 360 tablet 0  . metFORMIN (GLUCOPHAGE) 500 MG tablet TAKE 2 TABLETS BY MOUTH TWICE DAILY WITH MEAL. 120 tablet 3  . metoprolol succinate (TOPROL-XL) 25 MG 24 hr tablet Take 1 tablet by mouth  every day 90 tablet 3  . montelukast (SINGULAIR) 10 MG tablet Take 1 tablet by mouth at  bedtime 90 tablet 3  . venlafaxine XR (EFFEXOR-XR) 150 MG 24 hr capsule Take 1 capsule by mouth  every day 90 capsule 3  . Azelaic Acid 15 % cream Apply to skin twice a day (Patient not taking: Reported on 11/07/2016) 50 g 2  . Empagliflozin-Linagliptin (GLYXAMBI) 25-5 MG TABS Take 1 tablet by mouth daily. (Patient not taking: Reported on  11/07/2016) 30 tablet 5  . Fluocinolone Acetonide (DERMA-SMOOTHE/FS SCALP) 0.01 % OIL Apply to scalp after washing, leave overnight, rinse in AM (Patient not taking: Reported on 11/07/2016) 1 Bottle 2   No facility-administered medications prior to visit.     Allergies:  Allergies  Allergen Reactions  . Erythromycin Other (See Comments)    Gi upset/ cramps  . Hctz [Hydrochlorothiazide]     CAUSED GOUT      Review of Systems: See HPI for pertinent ROS. All other ROS negative.    Physical Exam: Blood pressure 134/86, pulse 99, temperature 98.4 F (36.9 C), temperature source Oral, resp. rate 18, weight 248 lb (112.5 kg), SpO2 97 %., Body mass index is 38.84 kg/m. General:  Obese WF. Appears in no acute distress. Neck: Supple. No thyromegaly. No lymphadenopathy. Lungs: Clear bilaterally to auscultation without wheezes, rales, or rhonchi. Breathing is unlabored. Heart: Regular rhythm. No murmurs, rubs, or gallops. Abdomen: Soft, non-tender, non-distended with normoactive bowel sounds. No hepatomegaly. No rebound/guarding. No obvious abdominal masses. Msk:  Strength and tone normal for age. No tenderness with percussion to costophrenic angles bilaterally. Extremities/Skin: Warm and dry. No clubbing or cyanosis. No edema. No rashes or suspicious lesions. Neuro: Alert and oriented X 3. Moves all extremities spontaneously. Gait is  normal. CNII-XII grossly in tact. Psych:  Responds to questions appropriately with a normal affect.     ASSESSMENT AND PLAN:  60 y.o. year old female with  1. Urinary tract infection without hematuria, site unspecified She was only able to leave a very small amount of urine. Urine dipstick shows trace leukocytes. Unable to do further urinalysis secondary to small specimen. However she is quite certain that she is having UTI symptoms and her symptoms are consistent with UTI. She is going to take the [place] as directed. Follow-up if symptoms do not resolve  upon completion of this. - nitrofurantoin, macrocrystal-monohydrate, (MACROBID) 100 MG capsule; Take 1 capsule (100 mg total) by mouth 2 (two) times daily.  Dispense: 10 capsule; Refill: 0  2. Burning with urination - Urinalysis, Routine w reflex microscopic   Signed, [address] [place], [place], [facility] 11/07/2016 2:48 PM

## 2016-12-10 DIAGNOSIS — L400 Psoriasis vulgaris: Secondary | ICD-10-CM | POA: Diagnosis not present

## 2016-12-25 DIAGNOSIS — R143 Flatulence: Secondary | ICD-10-CM | POA: Diagnosis not present

## 2016-12-27 ENCOUNTER — Other Ambulatory Visit: Payer: Self-pay | Admitting: Family Medicine

## 2017-01-09 ENCOUNTER — Other Ambulatory Visit: Payer: Self-pay | Admitting: Family Medicine

## 2017-01-17 DIAGNOSIS — R143 Flatulence: Secondary | ICD-10-CM | POA: Diagnosis not present

## 2017-03-13 ENCOUNTER — Other Ambulatory Visit: Payer: Self-pay | Admitting: Family Medicine

## 2017-04-06 ENCOUNTER — Other Ambulatory Visit: Payer: Self-pay | Admitting: Family Medicine

## 2017-04-07 ENCOUNTER — Other Ambulatory Visit: Payer: Self-pay | Admitting: Family Medicine

## 2017-04-07 NOTE — Telephone Encounter (Signed)
Medication refill for one time only.  Patient needs to be seen.  Letter sent for patient to call and schedule 

## 2017-06-22 ENCOUNTER — Other Ambulatory Visit: Payer: Self-pay | Admitting: Family Medicine

## 2017-07-21 ENCOUNTER — Other Ambulatory Visit: Payer: Self-pay | Admitting: Family Medicine

## 2017-07-31 DIAGNOSIS — Z01419 Encounter for gynecological examination (general) (routine) without abnormal findings: Secondary | ICD-10-CM | POA: Diagnosis not present

## 2017-08-18 ENCOUNTER — Other Ambulatory Visit: Payer: Self-pay | Admitting: Family Medicine

## 2017-09-12 ENCOUNTER — Ambulatory Visit (INDEPENDENT_AMBULATORY_CARE_PROVIDER_SITE_OTHER): Payer: 59 | Admitting: Family Medicine

## 2017-09-12 ENCOUNTER — Encounter: Payer: Self-pay | Admitting: Family Medicine

## 2017-09-12 VITALS — BP 138/100 | HR 98 | Temp 98.0°F | Resp 16 | Ht 67.0 in | Wt 254.0 lb

## 2017-09-12 DIAGNOSIS — M7581 Other shoulder lesions, right shoulder: Secondary | ICD-10-CM | POA: Diagnosis not present

## 2017-09-12 NOTE — Progress Notes (Signed)
Subjective:    Patient ID: Caitlyn [name], female    DOB: 16-Oct-1956, 61 y.o.   MRN: [id number]  HPI Patient reports pain in her shoulder 1 month. It hurts with external rotation. It also hurts with abduction greater than 100. It hurts with empty can sign testing. It hurts with Hawkins maneuver. She denies any numbness or tingling in her arm. She has a negative Spurling's test. She has no pain with internal rotation. Subscapularis liftoff test is negative. There is no palpable abnormality around the shoulder. There is no tenderness to palpation in the shoulder and there is no crepitus in the shoulder Past Medical History:  Diagnosis Date  . Asthma   . Cold and cough --- no fever  . Complication of anesthesia    24 yrs ago post tubal-- pulm. edema  (no problems post bowel surg. 2009)  . Diabetes mellitus    oral med  . Diverticulitis hx  -- w/ perf. bowel   s/p resection 2009  . Hypertension   . Reflux occasional   watches diet   Past Surgical History:  Procedure Laterality Date  . BOWEL RESECTION  2009   diverticulitis w/ perf. bowel  . HYSTEROSCOPY  10/09/2011   Procedure: HYSTEROSCOPY;  Surgeon: Margarette Asal;  Location: [facility];  Service: Gynecology;;  HYSTEROSCOPY D & C  . TUBAL LIGATION  24 yrs ago   Current Outpatient Prescriptions on File Prior to Visit  Medication Sig Dispense Refill  . amLODipine (NORVASC) 10 MG tablet TAKE 1 TABLET BY MOUTH  EVERY DAY 90 tablet 3  . fexofenadine (ALLEGRA) 180 MG tablet Take 180 mg by mouth daily.    Marland Kitchen losartan (COZAAR) 50 MG tablet TAKE 1 TABLET BY MOUTH  EVERY DAY 90 tablet 3  . metFORMIN (GLUCOPHAGE) 500 MG tablet TAKE 2 TABLETS BY MOUTH TWICE DAILY WITH MEAL 360 tablet 0  . metoprolol succinate (TOPROL-XL) 25 MG 24 hr tablet TAKE 1 TABLET BY MOUTH  EVERY DAY 90 tablet 3  . montelukast (SINGULAIR) 10 MG tablet TAKE 1 TABLET BY MOUTH AT  BEDTIME 90 tablet 3  . nitrofurantoin, macrocrystal-monohydrate, (MACROBID)  100 MG capsule Take 1 capsule (100 mg total) by mouth 2 (two) times daily. 10 capsule 0  . venlafaxine XR (EFFEXOR-XR) 150 MG 24 hr capsule TAKE 1 CAPSULE BY MOUTH  EVERY DAY 90 capsule 3   No current facility-administered medications on file prior to visit.    Allergies  Allergen Reactions  . Erythromycin Other (See Comments)    Gi upset/ cramps  . Hctz [Hydrochlorothiazide]     CAUSED GOUT   Social History   Social History  . Marital status: Married    Spouse name: N/A  . Number of children: N/A  . Years of education: N/A   Occupational History  . Not on file.   Social History Main Topics  . Smoking status: Never Smoker  . Smokeless tobacco: Never Used  . Alcohol use No  . Drug use: No  . Sexual activity: Not on file   Other Topics Concern  . Not on file   Social History Narrative  . No narrative on file      Review of Systems  All other systems reviewed and are negative.      Objective:   Physical Exam  Constitutional: She appears well-developed and well-nourished.  Cardiovascular: Normal rate, regular rhythm and normal heart sounds.   Pulmonary/Chest: Effort normal and breath sounds normal.  Musculoskeletal:  Right shoulder: She exhibits decreased range of motion, tenderness and pain. She exhibits no bony tenderness, no swelling, no effusion, no crepitus and normal strength.       Arms: Vitals reviewed.         Assessment & Plan:  I believe the patient has rotator cuff tendinitis. Using sterile technique, the right subacromial space was injected with a mixture of 2 mL of lidocaine, 2 mL of Marcaine, and 2 mL of 40 mg per mL Kenalog. Patient tolerated the procedure well without complication. Reassess in one week. Return fasting for a CBC, CMP, fasting lipid panel, hemoglobin A1c, and urine microalbuminin one week

## 2017-10-09 DIAGNOSIS — N840 Polyp of corpus uteri: Secondary | ICD-10-CM | POA: Diagnosis not present

## 2017-10-09 DIAGNOSIS — N950 Postmenopausal bleeding: Secondary | ICD-10-CM | POA: Diagnosis not present

## 2017-10-14 ENCOUNTER — Other Ambulatory Visit: Payer: 59

## 2017-10-14 ENCOUNTER — Other Ambulatory Visit: Payer: Self-pay | Admitting: Family Medicine

## 2017-10-14 DIAGNOSIS — N840 Polyp of corpus uteri: Secondary | ICD-10-CM | POA: Diagnosis not present

## 2017-10-14 DIAGNOSIS — I10 Essential (primary) hypertension: Secondary | ICD-10-CM

## 2017-10-14 DIAGNOSIS — E119 Type 2 diabetes mellitus without complications: Secondary | ICD-10-CM | POA: Diagnosis not present

## 2017-10-14 DIAGNOSIS — N950 Postmenopausal bleeding: Secondary | ICD-10-CM | POA: Diagnosis not present

## 2017-10-14 DIAGNOSIS — H2513 Age-related nuclear cataract, bilateral: Secondary | ICD-10-CM | POA: Diagnosis not present

## 2017-10-14 DIAGNOSIS — E1165 Type 2 diabetes mellitus with hyperglycemia: Secondary | ICD-10-CM

## 2017-10-14 DIAGNOSIS — Z79899 Other long term (current) drug therapy: Secondary | ICD-10-CM

## 2017-10-14 DIAGNOSIS — H43813 Vitreous degeneration, bilateral: Secondary | ICD-10-CM | POA: Diagnosis not present

## 2017-10-15 ENCOUNTER — Encounter: Payer: Self-pay | Admitting: Family Medicine

## 2017-10-15 LAB — LIPID PANEL
CHOL/HDL RATIO: 2.5 (calc) (ref <5.0)
CHOLESTEROL: 152 mg/dL (ref <200)
HDL: 62 mg/dL (ref >50)
LDL Cholesterol (Calc): 75 mg/dL (calc)
Non-HDL Cholesterol (Calc): 90 mg/dL (calc) (ref <130)
Triglycerides: 70 mg/dL (ref <150)

## 2017-10-15 LAB — CBC WITH DIFFERENTIAL/PLATELET
BASOS PCT: 0.5 %
Basophils Absolute: 41 cells/uL (ref 0–200)
EOS ABS: 170 {cells}/uL (ref 15–500)
Eosinophils Relative: 2.1 %
HEMATOCRIT: 39.8 % (ref 35.0–45.0)
HEMOGLOBIN: 13.5 g/dL (ref 11.7–15.5)
LYMPHS ABS: 1968 {cells}/uL (ref 850–3900)
MCH: 29.5 pg (ref 27.0–33.0)
MCHC: 33.9 g/dL (ref 32.0–36.0)
MCV: 86.9 fL (ref 80.0–100.0)
MONOS PCT: 6.4 %
MPV: 9.8 fL (ref 7.5–12.5)
NEUTROS ABS: 5403 {cells}/uL (ref 1500–7800)
Neutrophils Relative %: 66.7 %
Platelets: 321 10*3/uL (ref 140–400)
RBC: 4.58 10*6/uL (ref 3.80–5.10)
RDW: 12.7 % (ref 11.0–15.0)
Total Lymphocyte: 24.3 %
WBC: 8.1 10*3/uL (ref 3.8–10.8)
WBCMIX: 518 {cells}/uL (ref 200–950)

## 2017-10-15 LAB — COMPLETE METABOLIC PANEL WITH GFR
AG Ratio: 1.6 (calc) (ref 1.0–2.5)
ALKALINE PHOSPHATASE (APISO): 75 U/L (ref 33–130)
ALT: 22 U/L (ref 6–29)
AST: 16 U/L (ref 10–35)
Albumin: 4.2 g/dL (ref 3.6–5.1)
BILIRUBIN TOTAL: 0.4 mg/dL (ref 0.2–1.2)
BUN: 25 mg/dL (ref 7–25)
CHLORIDE: 104 mmol/L (ref 98–110)
CO2: 28 mmol/L (ref 20–32)
CREATININE: 0.88 mg/dL (ref 0.50–0.99)
Calcium: 9.5 mg/dL (ref 8.6–10.4)
GFR, Est African American: 83 mL/min/{1.73_m2} (ref >=60)
GFR, Est Non African American: 71 mL/min/{1.73_m2} (ref >=60)
GLOBULIN: 2.6 g/dL (ref 1.9–3.7)
Glucose, Bld: 116 mg/dL — ABNORMAL HIGH (ref 65–99)
Potassium: 5.3 mmol/L (ref 3.5–5.3)
SODIUM: 139 mmol/L (ref 135–146)
Total Protein: 6.8 g/dL (ref 6.1–8.1)

## 2017-10-15 LAB — HEMOGLOBIN A1C
EAG (MMOL/L): 7.7 (calc)
Hgb A1c MFr Bld: 6.5 % of total Hgb — ABNORMAL HIGH (ref <5.7)
MEAN PLASMA GLUCOSE: 140 (calc)

## 2017-10-15 LAB — MICROALBUMIN / CREATININE URINE RATIO
CREATININE, URINE: 138 mg/dL (ref 20–275)
Microalb Creat Ratio: 29 mcg/mg creat (ref <30)
Microalb, Ur: 4 mg/dL

## 2017-10-30 DIAGNOSIS — N840 Polyp of corpus uteri: Secondary | ICD-10-CM | POA: Diagnosis not present

## 2017-10-30 DIAGNOSIS — L400 Psoriasis vulgaris: Secondary | ICD-10-CM | POA: Diagnosis not present

## 2017-10-30 DIAGNOSIS — L820 Inflamed seborrheic keratosis: Secondary | ICD-10-CM | POA: Diagnosis not present

## 2017-10-30 DIAGNOSIS — N950 Postmenopausal bleeding: Secondary | ICD-10-CM | POA: Diagnosis not present

## 2017-10-30 DIAGNOSIS — Z79899 Other long term (current) drug therapy: Secondary | ICD-10-CM | POA: Diagnosis not present

## 2017-11-09 ENCOUNTER — Other Ambulatory Visit: Payer: Self-pay | Admitting: Family Medicine

## 2017-11-17 DIAGNOSIS — N3000 Acute cystitis without hematuria: Secondary | ICD-10-CM | POA: Diagnosis not present

## 2017-12-26 ENCOUNTER — Telehealth: Payer: Self-pay | Admitting: Family Medicine

## 2017-12-26 ENCOUNTER — Other Ambulatory Visit: Payer: Self-pay | Admitting: Family Medicine

## 2017-12-26 NOTE — Telephone Encounter (Signed)
Pt called and states that she has ulcers in her mouth and would like to have the magic mouthwash sent to pharm if possible.

## 2017-12-26 NOTE — Telephone Encounter (Signed)
Med called to [facility] as they are the only ones who have it however it is not covered by ins and pt is aware of this.

## 2017-12-26 NOTE — Progress Notes (Unsigned)
Magic mouthwash

## 2017-12-26 NOTE — Telephone Encounter (Signed)
Magic mouthwash 5 ml q 6rhs prn 4 oz.

## 2018-01-02 ENCOUNTER — Other Ambulatory Visit: Payer: Self-pay | Admitting: Family

## 2018-01-02 MED ORDER — OSELTAMIVIR PHOSPHATE 75 MG PO CAPS: 75.0000 mg | ORAL_CAPSULE | Freq: Every day | ORAL | 0 refills | Status: AC

## 2018-01-19 ENCOUNTER — Telehealth: Payer: Self-pay | Admitting: Family Medicine

## 2018-01-19 MED ORDER — BLOOD GLUCOSE METER KIT: PACK | 0 refills | Status: DC

## 2018-01-19 NOTE — Telephone Encounter (Signed)
Needs new meter and supplies - Sent to pharm

## 2018-01-29 ENCOUNTER — Encounter: Payer: Self-pay | Admitting: Family Medicine

## 2018-01-29 DIAGNOSIS — Z9049 Acquired absence of other specified parts of digestive tract: Secondary | ICD-10-CM | POA: Diagnosis not present

## 2018-01-29 DIAGNOSIS — R1013 Epigastric pain: Secondary | ICD-10-CM | POA: Diagnosis not present

## 2018-01-29 DIAGNOSIS — K648 Other hemorrhoids: Secondary | ICD-10-CM | POA: Diagnosis not present

## 2018-01-29 DIAGNOSIS — Z1211 Encounter for screening for malignant neoplasm of colon: Secondary | ICD-10-CM | POA: Diagnosis not present

## 2018-01-29 DIAGNOSIS — R110 Nausea: Secondary | ICD-10-CM | POA: Diagnosis not present

## 2018-01-29 DIAGNOSIS — K2960 Other gastritis without bleeding: Secondary | ICD-10-CM | POA: Diagnosis not present

## 2018-01-29 DIAGNOSIS — R109 Unspecified abdominal pain: Secondary | ICD-10-CM | POA: Diagnosis not present

## 2018-01-29 DIAGNOSIS — K2961 Other gastritis with bleeding: Secondary | ICD-10-CM | POA: Diagnosis not present

## 2018-02-04 ENCOUNTER — Other Ambulatory Visit: Payer: Self-pay | Admitting: Family Medicine

## 2018-02-12 DIAGNOSIS — L400 Psoriasis vulgaris: Secondary | ICD-10-CM | POA: Diagnosis not present

## 2018-02-15 ENCOUNTER — Ambulatory Visit: Payer: Self-pay | Admitting: Family

## 2018-02-15 ENCOUNTER — Encounter: Payer: Self-pay | Admitting: Family

## 2018-02-15 VITALS — BP 150/90 | HR 115 | Temp 99.5°F | Resp 18 | Wt 256.6 lb

## 2018-02-15 DIAGNOSIS — J101 Influenza due to other identified influenza virus with other respiratory manifestations: Secondary | ICD-10-CM

## 2018-02-15 DIAGNOSIS — R6889 Other general symptoms and signs: Secondary | ICD-10-CM

## 2018-02-15 LAB — POCT INFLUENZA A/B
INFLUENZA A, POC: NEGATIVE
INFLUENZA B, POC: POSITIVE — AB

## 2018-02-15 MED ORDER — PROMETHAZINE-PHENYLEPHRINE 6.25-5 MG/5ML PO SYRP: 5.0000 mL | ORAL_SOLUTION | Freq: Four times a day (QID) | ORAL | 0 refills | Status: DC | PRN

## 2018-02-15 NOTE — Patient Instructions (Signed)

## 2018-02-15 NOTE — Progress Notes (Signed)
poc

## 2018-02-15 NOTE — Progress Notes (Signed)
Subjective:     Patient ID: Caitlyn [name], female   DOB: 1956/04/26, 62 y.o.   MRN: [id number]  HPI 62 year old female is in today with c/o cough, congestion, fever, decreased appetite x 6 days. She has been taking Sudafed and Ibuprofen that help some. Has not taken any medication this morning.   Review of Systems  Constitutional: Positive for chills, fatigue and fever.  HENT: Positive for congestion, postnasal drip, rhinorrhea and sore throat. Negative for sinus pressure.   Respiratory: Positive for cough. Negative for shortness of breath and wheezing.   Cardiovascular: Negative.  Negative for chest pain.  Gastrointestinal: Negative.   Musculoskeletal: Negative.   Allergic/Immunologic: Negative.   Neurological: Negative.   Psychiatric/Behavioral: Negative.    Past Medical History:  Diagnosis Date  . Asthma   . Cold and cough --- no fever  . Complication of anesthesia    24 yrs ago post tubal-- pulm. edema  (no problems post bowel surg. 2009)  . Diabetes mellitus    oral med  . Diverticulitis hx  -- w/ perf. bowel   s/p resection 2009  . Hypertension   . Reflux occasional   watches diet    Social History   Socioeconomic History  . Marital status: Married    Spouse name: Not on file  . Number of children: Not on file  . Years of education: Not on file  . Highest education level: Not on file  Occupational History  . Not on file  Social Needs  . Financial resource strain: Not on file  . Food insecurity:    Worry: Not on file    Inability: Not on file  . Transportation needs:    Medical: Not on file    Non-medical: Not on file  Tobacco Use  . Smoking status: Never Smoker  . Smokeless tobacco: Never Used  Substance and Sexual Activity  . Alcohol use: No  . Drug use: No  . Sexual activity: Not on file  Lifestyle  . Physical activity:    Days per week: Not on file    Minutes per session: Not on file  . Stress: Not on file  Relationships  . Social connections:   Talks on phone: Not on file    Gets together: Not on file    Attends religious service: Not on file    Active member of club or organization: Not on file    Attends meetings of clubs or organizations: Not on file    Relationship status: Not on file  . Intimate partner violence:    Fear of current or ex partner: Not on file    Emotionally abused: Not on file    Physically abused: Not on file    Forced sexual activity: Not on file  Other Topics Concern  . Not on file  Social History Narrative  . Not on file    Past Surgical History:  Procedure Laterality Date  . BOWEL RESECTION  2009   diverticulitis w/ perf. bowel  . HYSTEROSCOPY  10/09/2011   Procedure: HYSTEROSCOPY;  Surgeon: Margarette Asal;  Location: [facility];  Service: Gynecology;;  HYSTEROSCOPY D & C  . TUBAL LIGATION  24 yrs ago    No family history on file.  Allergies  Allergen Reactions  . Erythromycin Other (See Comments)    Gi upset/ cramps  . Hctz [Hydrochlorothiazide]     CAUSED GOUT  . Macrolides And Ketolides Other (See Comments)    unknown  Current Outpatient Medications on File Prior to Visit  Medication Sig Dispense Refill  . acetaminophen (TYLENOL) 500 MG tablet Take 500 mg by mouth every 6 (six) hours as needed.    Marland Kitchen amLODipine (NORVASC) 10 MG tablet TAKE 1 TABLET BY MOUTH  EVERY DAY 90 tablet 3  . blood glucose meter kit and supplies Dispense based on patient and insurance preference. Check BS BID. (FOR ICD-10 E11.9). 1 each 0  . COSENTYX SENSOREADY 300 DOSE 150 MG/ML SOAJ     . fexofenadine (ALLEGRA) 180 MG tablet Take 180 mg by mouth daily.    Marland Kitchen losartan (COZAAR) 50 MG tablet TAKE 1 TABLET BY MOUTH  EVERY DAY 90 tablet 3  . metFORMIN (GLUCOPHAGE) 500 MG tablet TAKE 2 TABLETS BY MOUTH TWICE DAILY WITH MEAL 360 tablet 1  . metoprolol succinate (TOPROL-XL) 25 MG 24 hr tablet TAKE 1 TABLET BY MOUTH  EVERY DAY 90 tablet 3  . montelukast (SINGULAIR) 10 MG tablet TAKE 1 TABLET BY  MOUTH AT  BEDTIME 90 tablet 3  . pseudoephedrine (SUDAFED) 60 MG tablet Take 60 mg by mouth every 4 (four) hours as needed for congestion.    Marland Kitchen venlafaxine XR (EFFEXOR-XR) 150 MG 24 hr capsule TAKE 1 CAPSULE BY MOUTH  EVERY DAY 90 capsule 3  . nitrofurantoin, macrocrystal-monohydrate, (MACROBID) 100 MG capsule Take 1 capsule (100 mg total) by mouth 2 (two) times daily. (Patient not taking: Reported on 02/15/2018) 10 capsule 0   No current facility-administered medications on file prior to visit.     BP (!) 150/90 (BP Location: Left Arm, Patient Position: Sitting, Cuff Size: Large)   Pulse (!) 115   Temp 99.5 F (37.5 C) (Oral)   Resp 18   Wt 256 lb 9.6 oz (116.4 kg)   SpO2 93%   BMI 40.19 kg/m chart    Objective:   Physical Exam  Constitutional: She is oriented to person, place, and time. She appears well-developed and well-nourished.  HENT:  Right Ear: External ear normal.  Left Ear: External ear normal.  Mouth/Throat: Oropharynx is clear and moist.  Neck: Normal range of motion. Neck supple.  Cardiovascular: Normal rate, regular rhythm and normal heart sounds.  Pulmonary/Chest: Effort normal and breath sounds normal.  Abdominal: Soft. Bowel sounds are normal.  Musculoskeletal: Normal range of motion.  Neurological: She is alert and oriented to person, place, and time.  Skin: Skin is warm and dry.  Psychiatric: She has a normal mood and affect.       Assessment:     Caitlyn [name] was seen today for fever, emesis, chills, cough, headache, ear pain and generalized body aches.  Diagnoses and all orders for this visit:  Flu-like symptoms -     POCT Influenza A/B  Influenza B  Other orders -     promethazine-phenylephrine (PROMETHAZINE-PHENYLEPHRINE) 6.25-5 MG/5ML SYRP; Take 5 mLs by mouth every 6 (six) hours as needed for congestion.    Plan:     Call with any questions or concerns.

## 2018-02-23 ENCOUNTER — Ambulatory Visit: Payer: 59 | Admitting: Physician Assistant

## 2018-02-26 ENCOUNTER — Encounter: Payer: Self-pay | Admitting: Family Medicine

## 2018-02-26 ENCOUNTER — Ambulatory Visit: Payer: 59 | Admitting: Family Medicine

## 2018-02-26 ENCOUNTER — Other Ambulatory Visit: Payer: Self-pay | Admitting: Family Medicine

## 2018-02-26 VITALS — BP 132/90 | HR 86 | Temp 97.5°F | Resp 18 | Ht 67.0 in | Wt 258.0 lb

## 2018-02-26 DIAGNOSIS — R05 Cough: Secondary | ICD-10-CM

## 2018-02-26 DIAGNOSIS — K5090 Crohn's disease, unspecified, without complications: Secondary | ICD-10-CM | POA: Insufficient documentation

## 2018-02-26 DIAGNOSIS — L409 Psoriasis, unspecified: Secondary | ICD-10-CM | POA: Insufficient documentation

## 2018-02-26 DIAGNOSIS — R059 Cough, unspecified: Secondary | ICD-10-CM

## 2018-02-26 MED ORDER — HYDROCODONE-HOMATROPINE 5-1.5 MG/5ML PO SYRP: 5.0000 mL | ORAL_SOLUTION | Freq: Three times a day (TID) | ORAL | 0 refills | Status: DC | PRN

## 2018-02-26 MED ORDER — ALBUTEROL SULFATE HFA 108 (90 BASE) MCG/ACT IN AERS: 2.0000 | INHALATION_SPRAY | Freq: Four times a day (QID) | RESPIRATORY_TRACT | 0 refills | Status: DC | PRN

## 2018-02-26 NOTE — Progress Notes (Signed)
Subjective:    Patient ID: Caitlyn [name], female    DOB: 1955-12-16, 62 y.o.   MRN: [id number]  HPI  Patient had a flulike illness 2 weeks ago.  Illness has improved however she continues to have cough which is nonproductive.  She also reports occasional wheezing.  She has a past medical history of asthma but has no inhaler.  She does have some mild shortness of breath.  She denies any pleurisy.  She denies any purulent sputum.  She denies any hemoptysis.  She denies any fevers or chills.  She does have significant postnasal drip.  She is tried no cough medicine.  She is taking Allegra for her allergies.  She is on no decongestant. Past Surgical History:  Procedure Laterality Date  . BOWEL RESECTION  2009   diverticulitis w/ perf. bowel  . HYSTEROSCOPY  10/09/2011   Procedure: HYSTEROSCOPY;  Surgeon: Margarette Asal;  Location: [place] [facility];  Service: Gynecology;;  HYSTEROSCOPY D & C  . TUBAL LIGATION  24 yrs ago   Past Medical History:  Diagnosis Date  . Asthma   . Cold and cough --- no fever  . Complication of anesthesia    24 yrs ago post tubal-- pulm. edema  (no problems post bowel surg. 2009)  . Depression   . Diabetes mellitus    oral med  . Diverticulitis hx  -- w/ perf. bowel   s/p resection 2009  . Hypertension   . Psoriasis   . Reflux occasional   watches diet    Current Outpatient Medications on File Prior to Visit  Medication Sig Dispense Refill  . acetaminophen (TYLENOL) 500 MG tablet Take 500 mg by mouth every 6 (six) hours as needed.    Marland Kitchen amLODipine (NORVASC) 10 MG tablet TAKE 1 TABLET BY MOUTH  EVERY DAY 90 tablet 3  . blood glucose meter kit and supplies Dispense based on patient and insurance preference. Check BS BID. (FOR ICD-10 E11.9). 1 each 0  . COSENTYX SENSOREADY 300 DOSE 150 MG/ML SOAJ     . fexofenadine (ALLEGRA) 180 MG tablet Take 180 mg by mouth daily.    Marland Kitchen losartan (COZAAR) 50 MG tablet TAKE 1 TABLET BY MOUTH  EVERY DAY 90 tablet 3    . metFORMIN (GLUCOPHAGE) 500 MG tablet TAKE 2 TABLETS BY MOUTH TWICE DAILY WITH MEAL 360 tablet 1  . metoprolol succinate (TOPROL-XL) 25 MG 24 hr tablet TAKE 1 TABLET BY MOUTH  EVERY DAY 90 tablet 3  . montelukast (SINGULAIR) 10 MG tablet TAKE 1 TABLET BY MOUTH AT  BEDTIME 90 tablet 3  . nitrofurantoin, macrocrystal-monohydrate, (MACROBID) 100 MG capsule Take 1 capsule (100 mg total) by mouth 2 (two) times daily. (Patient not taking: Reported on 02/15/2018) 10 capsule 0  . promethazine-phenylephrine (PROMETHAZINE-PHENYLEPHRINE) 6.25-5 MG/5ML SYRP Take 5 mLs by mouth every 6 (six) hours as needed for congestion. 280 mL 0  . pseudoephedrine (SUDAFED) 60 MG tablet Take 60 mg by mouth every 4 (four) hours as needed for congestion.    Marland Kitchen venlafaxine XR (EFFEXOR-XR) 150 MG 24 hr capsule TAKE 1 CAPSULE BY MOUTH  EVERY DAY 90 capsule 3   No current facility-administered medications on file prior to visit.    Allergies  Allergen Reactions  . Erythromycin Other (See Comments)    Gi upset/ cramps  . Hctz [Hydrochlorothiazide]     CAUSED GOUT  . Macrolides And Ketolides Other (See Comments)    unknown   Social History  Socioeconomic History  . Marital status: Married    Spouse name: Not on file  . Number of children: Not on file  . Years of education: Not on file  . Highest education level: Not on file  Occupational History  . Not on file  Social Needs  . Financial resource strain: Not on file  . Food insecurity:    Worry: Not on file    Inability: Not on file  . Transportation needs:    Medical: Not on file    Non-medical: Not on file  Tobacco Use  . Smoking status: Never Smoker  . Smokeless tobacco: Never Used  Substance and Sexual Activity  . Alcohol use: No  . Drug use: No  . Sexual activity: Not on file  Lifestyle  . Physical activity:    Days per week: Not on file    Minutes per session: Not on file  . Stress: Not on file  Relationships  . Social connections:    Talks on  phone: Not on file    Gets together: Not on file    Attends religious service: Not on file    Active member of club or organization: Not on file    Attends meetings of clubs or organizations: Not on file    Relationship status: Not on file  . Intimate partner violence:    Fear of current or ex partner: Not on file    Emotionally abused: Not on file    Physically abused: Not on file    Forced sexual activity: Not on file  Other Topics Concern  . Not on file  Social History Narrative  . Not on file    .pmh  Review of Systems  All other systems reviewed and are negative.      Objective:   Physical Exam  Constitutional: She appears well-developed and well-nourished. No distress.  HENT:  Right Ear: Tympanic membrane, external ear and ear canal normal.  Left Ear: Tympanic membrane, external ear and ear canal normal.  Nose: Mucosal edema and rhinorrhea present. Right sinus exhibits no maxillary sinus tenderness and no frontal sinus tenderness. Left sinus exhibits no maxillary sinus tenderness and no frontal sinus tenderness.  Mouth/Throat: Oropharynx is clear and moist. No oropharyngeal exudate.  Eyes: Conjunctivae are normal.  Neck: Neck supple.  Cardiovascular: Normal rate, regular rhythm, normal heart sounds and intact distal pulses.  No murmur heard. Pulmonary/Chest: Effort normal and breath sounds normal. No respiratory distress. She has no wheezes. She has no rales.  Lymphadenopathy:    She has no cervical adenopathy.  Skin: She is not diaphoretic.  Vitals reviewed.            Cough - Plan: HYDROcodone-homatropine (HYCODAN) 5-1.5 MG/5ML syrup, albuterol (PROVENTIL HFA;VENTOLIN HFA) 108 (90 Base) MCG/ACT inhaler  I believe the cough is just a lingering effect of her recent viral upper respiratory infection.  I see no evidence on her exam today of a serious bacterial infection, pneumonia, or bacterial bronchitis.  I have recommended supportive care.  I would add  Flonase to her Allegra to improve pnd which is contributing to her cough.  I prescribed albuterol inhaler, 2 puffs every 4-6 hours as needed for wheezing.  She does report episodes of occasional wheezing and shortness of breath that may be a mild reactive airway disease due to the upper respiratory infection.  I see no indication for prednisone at the present time.  I also gave her a prescription of Hycodan to use as needed   for cough primarily at night.  Reassess in 1 week if no better or sooner if worsening

## 2018-03-02 ENCOUNTER — Other Ambulatory Visit: Payer: Self-pay | Admitting: Family Medicine

## 2018-03-02 MED ORDER — ACCU-CHEK FASTCLIX LANCETS MISC: 2 refills | Status: DC

## 2018-05-13 DIAGNOSIS — L400 Psoriasis vulgaris: Secondary | ICD-10-CM | POA: Diagnosis not present

## 2018-05-13 DIAGNOSIS — Z79899 Other long term (current) drug therapy: Secondary | ICD-10-CM | POA: Diagnosis not present

## 2018-07-14 DIAGNOSIS — H00025 Hordeolum internum left lower eyelid: Secondary | ICD-10-CM | POA: Diagnosis not present

## 2018-08-06 DIAGNOSIS — Z01419 Encounter for gynecological examination (general) (routine) without abnormal findings: Secondary | ICD-10-CM | POA: Diagnosis not present

## 2018-08-06 DIAGNOSIS — Z6839 Body mass index (BMI) 39.0-39.9, adult: Secondary | ICD-10-CM | POA: Diagnosis not present

## 2018-08-06 DIAGNOSIS — Z1231 Encounter for screening mammogram for malignant neoplasm of breast: Secondary | ICD-10-CM | POA: Diagnosis not present

## 2018-08-14 DIAGNOSIS — Z13820 Encounter for screening for osteoporosis: Secondary | ICD-10-CM | POA: Diagnosis not present

## 2018-09-04 DIAGNOSIS — Z23 Encounter for immunization: Secondary | ICD-10-CM | POA: Diagnosis not present

## 2018-10-20 DIAGNOSIS — H25013 Cortical age-related cataract, bilateral: Secondary | ICD-10-CM | POA: Diagnosis not present

## 2018-10-20 DIAGNOSIS — E119 Type 2 diabetes mellitus without complications: Secondary | ICD-10-CM | POA: Diagnosis not present

## 2018-10-20 DIAGNOSIS — H2513 Age-related nuclear cataract, bilateral: Secondary | ICD-10-CM | POA: Diagnosis not present

## 2018-10-20 LAB — HM DIABETES EYE EXAM

## 2018-10-27 ENCOUNTER — Encounter: Payer: Self-pay | Admitting: Family Medicine

## 2018-10-27 DIAGNOSIS — L400 Psoriasis vulgaris: Secondary | ICD-10-CM | POA: Diagnosis not present

## 2018-11-19 DIAGNOSIS — M7661 Achilles tendinitis, right leg: Secondary | ICD-10-CM | POA: Diagnosis not present

## 2018-11-19 DIAGNOSIS — M7731 Calcaneal spur, right foot: Secondary | ICD-10-CM | POA: Diagnosis not present

## 2018-11-26 ENCOUNTER — Encounter: Payer: Self-pay | Admitting: *Deleted

## 2018-11-26 DIAGNOSIS — M7661 Achilles tendinitis, right leg: Secondary | ICD-10-CM | POA: Diagnosis not present

## 2018-12-03 DIAGNOSIS — M7661 Achilles tendinitis, right leg: Secondary | ICD-10-CM | POA: Diagnosis not present

## 2018-12-03 DIAGNOSIS — M71571 Other bursitis, not elsewhere classified, right ankle and foot: Secondary | ICD-10-CM | POA: Diagnosis not present

## 2018-12-06 ENCOUNTER — Other Ambulatory Visit: Payer: Self-pay | Admitting: Family Medicine

## 2018-12-12 ENCOUNTER — Other Ambulatory Visit: Payer: Self-pay | Admitting: Family Medicine

## 2018-12-15 ENCOUNTER — Encounter: Payer: Self-pay | Admitting: Physician Assistant

## 2018-12-15 ENCOUNTER — Ambulatory Visit: Payer: Self-pay | Admitting: Physician Assistant

## 2018-12-15 VITALS — BP 140/100 | HR 90 | Temp 98.4°F | Resp 18 | Wt 250.8 lb

## 2018-12-15 DIAGNOSIS — R82998 Other abnormal findings in urine: Secondary | ICD-10-CM

## 2018-12-15 DIAGNOSIS — R300 Dysuria: Secondary | ICD-10-CM

## 2018-12-15 DIAGNOSIS — R030 Elevated blood-pressure reading, without diagnosis of hypertension: Secondary | ICD-10-CM

## 2018-12-15 LAB — POCT URINALYSIS DIPSTICK
Bilirubin, UA: NEGATIVE
Glucose, UA: NEGATIVE
Nitrite, UA: NEGATIVE
Protein, UA: POSITIVE — AB
Spec Grav, UA: 1.025 (ref 1.010–1.025)
Urobilinogen, UA: 0.2 E.U./dL
pH, UA: 5 (ref 5.0–8.0)

## 2018-12-15 MED ORDER — CEPHALEXIN 500 MG PO CAPS: 500.0000 mg | ORAL_CAPSULE | Freq: Two times a day (BID) | ORAL | 0 refills | Status: AC

## 2018-12-15 NOTE — Progress Notes (Signed)
12/15/2018 at 7:49 PM  Alcario Drought / DOB: May 20, 1956 / MRN: [id number]  The patient has Type II diabetes mellitus, uncontrolled ([place]); Hx of Crohn's disease; Asthma; HTN (hypertension); Crohn disease ([place]); and Psoriasis on their problem list.  SUBJECTIVE  ANIAH PAULI is a 63 y.o. female who complains of dysuria, urinary frequency and urinary urgency x 2 days. She denies hematuria, flank pain, abdominal pain, pelvic pain, cloudy malordorous urine, genital rash, genital irritation and vaginal discharge. Has tried cranberry juice with some relief. Most recent UTI prior to this was a few years ago. Used to get them more frequently. Typically responds well to keflex. Not currently sexually active. Denies smoking. Has PMH of DM: last A1C 09/2017 was 6.5, and HTN. Has been checking sugars at home and they are running in the 100s. No PMH of kidney disease.   She  has a past medical history of Asthma, Cold (and cough --- no fever), Complication of anesthesia, Depression, Diabetes mellitus, Diverticulitis (hx  -- w/ perf. bowel), Hypertension, Psoriasis, and Reflux (occasional).    Medications reviewed and updated by myself where necessary, and exist elsewhere in the encounter.   Ms. Meador is allergic to erythromycin; hctz [hydrochlorothiazide]; and macrolides and ketolides. She  reports that she has never smoked. She has never used smokeless tobacco. She reports that she does not drink alcohol or use drugs. She  has no history on file for sexual activity. The patient  has a past surgical history that includes Tubal ligation (24 yrs ago); Bowel resection (2009); and Hysteroscopy (10/09/2011).  Her family history is not on file.  Review of Systems  Constitutional: Negative for chills and diaphoresis.  Respiratory: Negative for shortness of breath.   Cardiovascular: Negative for chest pain.  Gastrointestinal: Negative for abdominal pain, nausea and vomiting.  Genitourinary: Negative for flank pain.   Neurological: Negative for dizziness.    OBJECTIVE  Her  weight is 250 lb 12.8 oz (113.8 kg). Her oral temperature is 98.4 F (36.9 C). Her blood pressure is 140/100 (abnormal) and her pulse is 90. Her respiration is 18 and oxygen saturation is 97%.  The patient's body mass index is 39.28 kg/m.  Physical Exam Vitals signs reviewed.  Constitutional:      General: She is not in acute distress.    Appearance: Normal appearance. She is well-developed.  HENT:     Head: Normocephalic and atraumatic.  Eyes:     Conjunctiva/sclera: Conjunctivae normal.  Neck:     Musculoskeletal: Normal range of motion.  Pulmonary:     Effort: Pulmonary effort is normal.  Abdominal:     Palpations: Abdomen is soft.     Tenderness: There is no abdominal tenderness. There is no right CVA tenderness or left CVA tenderness.  Skin:    General: Skin is warm and dry.  Neurological:     Mental Status: She is alert and oriented to person, place, and time.     Results for orders placed or performed in visit on 12/15/18 (from the past 24 hour(s))  POCT urinalysis dipstick     Status: Abnormal   Collection Time: 12/15/18  7:23 PM  Result Value Ref Range   Color, UA AMBER    Clarity, UA CLOUDY    Glucose, UA Negative Negative   Bilirubin, UA NEGATIVE    Ketones, UA SMALL    Spec Grav, UA 1.025 1.010 - 1.025   Blood, UA LARGE    pH, UA 5.0 5.0 - 8.0  Protein, UA Positive (A) Negative   Urobilinogen, UA 0.2 0.2 or 1.0 E.U./dL   Nitrite, UA NEGATIVE    Leukocytes, UA Large (3+) (A) Negative   Appearance     Odor      ASSESSMENT & PLAN  Francessca was seen today for dysuria.  Diagnoses and all orders for this visit:  Dysuria -     POCT urinalysis dipstick  Leukocytes in urine -     cephALEXin (KEFLEX) 500 MG capsule; Take 1 capsule (500 mg total) by mouth 2 (two) times daily for 7 days.  Elevated blood pressure reading    Hx and UA suggestive of UTI. + large leukocytes, blood, protein, and  small ketones.  No glucosuria.  Will treat empirically at this time for UTI.  No concerning signs or symptoms suggesting pyelonephritis.  She is overall well-appearing, no acute distress.  She is afebrile.  Explained to patient that we do not obtain urine cultures in our office.  If she is not having any improvement over the next 24 to 48 hours, recommend follow-up with family doctor as she will likely need urine culture at that time.  If any of her symptoms worsen or she develops new concerning symptoms, recommend she seek care immediately at local urgent care or ED.  Discussed with patient that she does have blood in her urine, which is likely due to infection.  However, would recommend she follow-up with family doctor in the next 2 weeks to ensure hematuria resolves with treatment of UTI.  Patient's blood pressure is also elevated at 140/100.  She believes it is due to her urinary discomfort.  She is otherwise asymptomatic.  Recommend checking blood pressure outside the office and following up with family doctor if continues to remain elevated.  Given strict ED precautions.  Tenna Delaine, Hershal Coria  [facility] 12/15/2018 7:49 PM

## 2018-12-15 NOTE — Patient Instructions (Signed)
Your results indicate you have a UTI. I have given you a prescription for an antibiotic. Please take with food. We do not obtain urine cultures at our office; therefore, if your symptoms worsen over the next 24-48 hours or you develop fever, chills, flank pain, nausea and vomiting, please seek care immediately.  Your urine did have some blood in it, which is likely due to the infection. However, I would recommend following up with your family doctor in 2 weeks for repeat urinalysis to ensure it has completely resolved.   Thank you for letting me participate in your health and well being.  Urinary Tract Infection, Adult A urinary tract infection (UTI) is an infection of any part of the urinary tract. The urinary tract includes:  The kidneys.  The ureters.  The bladder.  The urethra. These organs make, store, and get rid of pee (urine) in the body. What are the causes? This is caused by germs (bacteria) in your genital area. These germs grow and cause swelling (inflammation) of your urinary tract. What increases the risk? You are more likely to develop this condition if:  You have a small, thin tube (catheter) to drain pee.  You cannot control when you pee or poop (incontinence).  You are female, and: ? You use these methods to prevent pregnancy: ? A medicine that kills sperm (spermicide). ? A device that blocks sperm (diaphragm). ? You have low levels of a female hormone (estrogen). ? You are pregnant.  You have genes that add to your risk.  You are sexually active.  You take antibiotic medicines.  You have trouble peeing because of: ? A prostate that is bigger than normal, if you are female. ? A blockage in the part of your body that drains pee from the bladder (urethra). ? A kidney stone. ? A nerve condition that affects your bladder (neurogenic bladder). ? Not getting enough to drink. ? Not peeing often enough.  You have other conditions, such as: ? Diabetes. ? A weak  disease-fighting system (immune system). ? Sickle cell disease. ? Gout. ? Injury of the spine. What are the signs or symptoms? Symptoms of this condition include:  Needing to pee right away (urgently).  Peeing often.  Peeing small amounts often.  Pain or burning when peeing.  Blood in the pee.  Pee that smells bad or not like normal.  Trouble peeing.  Pee that is cloudy.  Fluid coming from the vagina, if you are female.  Pain in the belly or lower back. Other symptoms include:  Throwing up (vomiting).  No urge to eat.  Feeling mixed up (confused).  Being tired and grouchy (irritable).  A fever.  Watery poop (diarrhea). How is this treated? This condition may be treated with:  Antibiotic medicine.  Other medicines.  Drinking enough water. Follow these instructions at home:  Medicines  Take over-the-counter and prescription medicines only as told by your doctor.  If you were prescribed an antibiotic medicine, take it as told by your doctor. Do not stop taking it even if you start to feel better. General instructions  Make sure you: ? Pee until your bladder is empty. ? Do not hold pee for a long time. ? Empty your bladder after sex. ? Wipe from front to back after pooping if you are a female. Use each tissue one time when you wipe.  Drink enough fluid to keep your pee pale yellow.  Keep all follow-up visits as told by your doctor. This is  important. Contact a doctor if:  You do not get better after 1-2 days.  Your symptoms go away and then come back. Get help right away if:  You have very bad back pain.  You have very bad pain in your lower belly.  You have a fever.  You are sick to your stomach (nauseous).  You are throwing up. Summary  A urinary tract infection (UTI) is an infection of any part of the urinary tract.  This condition is caused by germs in your genital area.  There are many risk factors for a UTI. These include  having a small, thin tube to drain pee and not being able to control when you pee or poop.  Treatment includes antibiotic medicines for germs.  Drink enough fluid to keep your pee pale yellow. This information is not intended to replace advice given to you by your health care provider. Make sure you discuss any questions you have with your health care provider. Document Released: 04/29/2008 Document Revised: 05/21/2018 Document Reviewed: 05/21/2018 Elsevier Interactive Patient Education  2019 [employer].

## 2018-12-16 ENCOUNTER — Ambulatory Visit: Payer: 59 | Admitting: Family Medicine

## 2019-01-01 ENCOUNTER — Encounter: Payer: Self-pay | Admitting: Family Medicine

## 2019-01-01 ENCOUNTER — Ambulatory Visit: Payer: 59 | Admitting: Family Medicine

## 2019-01-01 VITALS — BP 160/104 | HR 98 | Temp 98.2°F | Resp 16 | Ht 67.0 in | Wt 246.0 lb

## 2019-01-01 DIAGNOSIS — E1165 Type 2 diabetes mellitus with hyperglycemia: Secondary | ICD-10-CM | POA: Diagnosis not present

## 2019-01-01 DIAGNOSIS — I10 Essential (primary) hypertension: Secondary | ICD-10-CM

## 2019-01-01 DIAGNOSIS — N390 Urinary tract infection, site not specified: Secondary | ICD-10-CM

## 2019-01-01 LAB — MICROSCOPIC MESSAGE

## 2019-01-01 LAB — URINALYSIS, ROUTINE W REFLEX MICROSCOPIC
BILIRUBIN URINE: NEGATIVE
GLUCOSE, UA: NEGATIVE
Nitrite: NEGATIVE
PH: 6 (ref 5.0–8.0)
Specific Gravity, Urine: 1.025 (ref 1.001–1.03)

## 2019-01-01 MED ORDER — DOXAZOSIN MESYLATE 2 MG PO TABS: 2.0000 mg | ORAL_TABLET | Freq: Every day | ORAL | 5 refills | Status: DC

## 2019-01-01 MED ORDER — DICLOFENAC SODIUM 1 % TD GEL: 2.0000 g | Freq: Four times a day (QID) | TRANSDERMAL | 0 refills | Status: DC

## 2019-01-01 MED ORDER — CIPROFLOXACIN HCL 500 MG PO TABS: 500.0000 mg | ORAL_TABLET | Freq: Two times a day (BID) | ORAL | 0 refills | Status: DC

## 2019-01-01 NOTE — Addendum Note (Signed)
Addended by: Shary Decamp B on: 01/01/2019 08:36 AM   Modules accepted: Orders

## 2019-01-01 NOTE — Progress Notes (Signed)
Subjective:    Patient ID: Caitlyn [name], female    DOB: [birth year], 63 y.o.   MRN: [id number]  HPI  Was seen 12/15/18 at urgent care for UTI.  Was given keflex and BP was elevated at 140/100.  Of note, patient has type 2 DM and has not had fasting labs since 09/2017 when HgA1c was controlled at 6.5.  Here, patient's blood pressure is elevated at greater than 160/100.  She is nervous due to lab work however it looks like she has uncontrolled high blood pressure as well.  She is not checking her blood pressure at home.  We repeated a urinalysis today.  There is +1 protein, negative nitrites, trace blood, trace leukocyte esterase however the patient is completely asymptomatic.  She denies any dysuria, urgency, or frequency.  She has not been checking her blood sugar.  However she denies any polyuria, polydipsia, or blurry vision.  She denies any chest pain shortness of breath or dyspnea on exertion.  She does report occasional burning in her feet.  Diabetic foot exam was performed today and is significant for normal dorsalis pedis pulses and posterior tibialis pulses with normal sensation to 10 g monofilament bilaterally and no evidence of callus or ulcer formation however she does have some tenderness to palpation over the Achilles tendon attachment point on the calcaneus suggesting possible bursitis in this area as well. Past Surgical History:  Procedure Laterality Date  . BOWEL RESECTION  2009   diverticulitis w/ perf. bowel  . HYSTEROSCOPY  10/09/2011   Procedure: HYSTEROSCOPY;  Surgeon: Margarette Asal;  Location: [facility];  Service: Gynecology;;  HYSTEROSCOPY D & C  . TUBAL LIGATION  24 yrs ago   Past Medical History:  Diagnosis Date  . Asthma   . Cold and cough --- no fever  . Complication of anesthesia    24 yrs ago post tubal-- pulm. edema  (no problems post bowel surg. 2009)  . Depression   . Diabetes mellitus    oral med  . Diverticulitis hx  -- w/ perf. bowel   s/p resection 2009  . Hypertension   . Psoriasis   . Reflux occasional   watches diet    Current Outpatient Medications on File Prior to Visit  Medication Sig Dispense Refill  . ACCU-CHEK FASTCLIX LANCETS MISC Tes bid 100 each 2  . acetaminophen (TYLENOL) 500 MG tablet Take 500 mg by mouth every 6 (six) hours as needed.    Marland Kitchen albuterol (PROVENTIL HFA;VENTOLIN HFA) 108 (90 Base) MCG/ACT inhaler INHALE 2 PUFFS INTO THE LUNGS EVERY 6 HOURS AS NEEDED FOR WHEEZING OR SHORTNESS OF BREATH (Patient not taking: Reported on 12/15/2018) 25.5 g 0  . amLODipine (NORVASC) 10 MG tablet TAKE 1 TABLET BY MOUTH  EVERY DAY 90 tablet 3  . blood glucose meter kit and supplies Dispense based on patient and insurance preference. Check BS BID. (FOR ICD-10 E11.9). 1 each 0  . COSENTYX SENSOREADY 300 DOSE 150 MG/ML SOAJ     . fexofenadine (ALLEGRA) 180 MG tablet Take 180 mg by mouth daily.    Marland Kitchen HYDROcodone-homatropine (HYCODAN) 5-1.5 MG/5ML syrup Take 5 mLs by mouth every 8 (eight) hours as needed for cough. (Patient not taking: Reported on 12/15/2018) 120 mL 0  . losartan (COZAAR) 50 MG tablet TAKE 1 TABLET BY MOUTH  EVERY DAY 90 tablet 3  . metFORMIN (GLUCOPHAGE) 500 MG tablet TAKE 2 TABLETS BY MOUTH TWICE DAILY WITH MEAL 360 tablet 1  .  metoprolol succinate (TOPROL-XL) 25 MG 24 hr tablet TAKE 1 TABLET BY MOUTH  EVERY DAY 90 tablet 3  . montelukast (SINGULAIR) 10 MG tablet TAKE 1 TABLET BY MOUTH AT  BEDTIME 90 tablet 3  . venlafaxine XR (EFFEXOR-XR) 150 MG 24 hr capsule TAKE 1 CAPSULE BY MOUTH  EVERY DAY 90 capsule 3   No current facility-administered medications on file prior to visit.    Allergies  Allergen Reactions  . Erythromycin Other (See Comments)    Gi upset/ cramps  . Hctz [Hydrochlorothiazide]     CAUSED GOUT  . Macrolides And Ketolides Other (See Comments)    unknown   Social History   Socioeconomic History  . Marital status: Married    Spouse name: Not on file  . Number of children: Not on  file  . Years of education: Not on file  . Highest education level: Not on file  Occupational History  . Not on file  Social Needs  . Financial resource strain: Not on file  . Food insecurity:    Worry: Not on file    Inability: Not on file  . Transportation needs:    Medical: Not on file    Non-medical: Not on file  Tobacco Use  . Smoking status: Never Smoker  . Smokeless tobacco: Never Used  Substance and Sexual Activity  . Alcohol use: No  . Drug use: No  . Sexual activity: Not on file  Lifestyle  . Physical activity:    Days per week: Not on file    Minutes per session: Not on file  . Stress: Not on file  Relationships  . Social connections:    Talks on phone: Not on file    Gets together: Not on file    Attends religious service: Not on file    Active member of club or organization: Not on file    Attends meetings of clubs or organizations: Not on file    Relationship status: Not on file  . Intimate partner violence:    Fear of current or ex partner: Not on file    Emotionally abused: Not on file    Physically abused: Not on file    Forced sexual activity: Not on file  Other Topics Concern  . Not on file  Social History Narrative  . Not on file     Review of Systems  All other systems reviewed and are negative.      Objective:   Physical Exam  Constitutional: She appears well-developed and well-nourished. No distress.  HENT:  Right Ear: Tympanic membrane, external ear and ear canal normal.  Left Ear: Tympanic membrane, external ear and ear canal normal.  Mouth/Throat: Oropharynx is clear and moist. No oropharyngeal exudate.  Eyes: Conjunctivae are normal.  Neck: Neck supple.  Cardiovascular: Normal rate, regular rhythm, normal heart sounds and intact distal pulses.  No murmur heard. Pulmonary/Chest: Effort normal and breath sounds normal. No respiratory distress. She has no wheezes. She has no rales.  Abdominal: Soft. Bowel sounds are normal. She  exhibits no distension. There is no abdominal tenderness. There is no rebound.  Musculoskeletal:     Right ankle: Tenderness.       Feet:  Lymphadenopathy:    She has no cervical adenopathy.  Skin: She is not diaphoretic.  Vitals reviewed.           Uncontrolled type 2 diabetes mellitus with hyperglycemia (HCC) - Plan: Hemoglobin A1c, CBC with Differential/Platelet, COMPLETE METABOLIC PANEL WITH GFR,  Lipid panel, Microalbumin, urine  Essential hypertension  Also believe the patient has Achilles tendinitis.  As well as an abnormal urinalysis  Patient is asymptomatic.  I am not sure what to make of the urinalysis.  I will send a urine culture.  If culture shows persistent infection we will treat this.  The present time the patient is asymptomatic and therefore I would not start any antibiotics at this time.  Believe the patient has Achilles tendinitis on her right heel.  We will treat this with Voltaren gel 2 g 4 times daily as needed pain.  Blood pressures too high.  Add Cardura 2 mg a day and recheck blood pressure in 1 month.  Encouraged the patient to discontinue sodium and start checking blood pressure at home.  Meanwhile check hemoglobin A1c.  Goal hemoglobin A1c is less than 6.5.  Check urine microalbumin as well as fasting lipid panel.  Goal LDL cholesterol is less than 100.

## 2019-01-02 LAB — COMPLETE METABOLIC PANEL WITH GFR
AG Ratio: 1.4 (calc) (ref 1.0–2.5)
ALT: 33 U/L — ABNORMAL HIGH (ref 6–29)
AST: 25 U/L (ref 10–35)
Albumin: 4.4 g/dL (ref 3.6–5.1)
Alkaline phosphatase (APISO): 81 U/L (ref 37–153)
BUN: 25 mg/dL (ref 7–25)
CALCIUM: 10.2 mg/dL (ref 8.6–10.4)
CO2: 29 mmol/L (ref 20–32)
Chloride: 103 mmol/L (ref 98–110)
Creat: 0.93 mg/dL (ref 0.50–0.99)
GFR, EST NON AFRICAN AMERICAN: 66 mL/min/{1.73_m2} (ref >=60)
GFR, Est African American: 76 mL/min/{1.73_m2} (ref >=60)
GLUCOSE: 153 mg/dL — AB (ref 65–99)
Globulin: 3.1 g/dL (calc) (ref 1.9–3.7)
Potassium: 5.3 mmol/L (ref 3.5–5.3)
Sodium: 140 mmol/L (ref 135–146)
Total Bilirubin: 0.6 mg/dL (ref 0.2–1.2)
Total Protein: 7.5 g/dL (ref 6.1–8.1)

## 2019-01-02 LAB — HEMOGLOBIN A1C
EAG (MMOL/L): 7.4 (calc)
HEMOGLOBIN A1C: 6.3 %{Hb} — AB (ref <5.7)
MEAN PLASMA GLUCOSE: 134 (calc)

## 2019-01-02 LAB — CBC WITH DIFFERENTIAL/PLATELET
Absolute Monocytes: 494 cells/uL (ref 200–950)
Basophils Absolute: 59 cells/uL (ref 0–200)
Basophils Relative: 0.9 %
EOS PCT: 4.3 %
Eosinophils Absolute: 280 cells/uL (ref 15–500)
HEMATOCRIT: 38.6 % (ref 35.0–45.0)
Hemoglobin: 13.5 g/dL (ref 11.7–15.5)
LYMPHS ABS: 1456 {cells}/uL (ref 850–3900)
MCH: 30.3 pg (ref 27.0–33.0)
MCHC: 35 g/dL (ref 32.0–36.0)
MCV: 86.7 fL (ref 80.0–100.0)
MPV: 9.9 fL (ref 7.5–12.5)
Monocytes Relative: 7.6 %
Neutro Abs: 4212 cells/uL (ref 1500–7800)
Neutrophils Relative %: 64.8 %
Platelets: 352 10*3/uL (ref 140–400)
RBC: 4.45 10*6/uL (ref 3.80–5.10)
RDW: 12.4 % (ref 11.0–15.0)
Total Lymphocyte: 22.4 %
WBC: 6.5 10*3/uL (ref 3.8–10.8)

## 2019-01-02 LAB — LIPID PANEL
Cholesterol: 161 mg/dL (ref <200)
HDL: 48 mg/dL — ABNORMAL LOW (ref >=50)
LDL Cholesterol (Calc): 96 mg/dL (calc)
NON-HDL CHOLESTEROL (CALC): 113 mg/dL (ref <130)
Total CHOL/HDL Ratio: 3.4 (calc) (ref <5.0)
Triglycerides: 83 mg/dL (ref <150)

## 2019-01-02 LAB — MICROALBUMIN, URINE: Microalb, Ur: 7.5 mg/dL

## 2019-01-03 LAB — URINE CULTURE
MICRO NUMBER:: 166622
SPECIMEN QUALITY:: ADEQUATE

## 2019-03-03 ENCOUNTER — Other Ambulatory Visit: Payer: Self-pay | Admitting: *Deleted

## 2019-03-03 MED ORDER — DICLOFENAC SODIUM 1 % TD GEL: 2.0000 g | Freq: Four times a day (QID) | TRANSDERMAL | 0 refills | Status: DC

## 2019-05-03 DIAGNOSIS — M2550 Pain in unspecified joint: Secondary | ICD-10-CM | POA: Diagnosis not present

## 2019-05-03 DIAGNOSIS — L409 Psoriasis, unspecified: Secondary | ICD-10-CM | POA: Diagnosis not present

## 2019-05-20 ENCOUNTER — Encounter: Payer: Self-pay | Admitting: Family Medicine

## 2019-05-21 ENCOUNTER — Encounter: Payer: Self-pay | Admitting: Family Medicine

## 2019-06-10 ENCOUNTER — Telehealth: Payer: 59 | Admitting: Family

## 2019-06-10 DIAGNOSIS — R109 Unspecified abdominal pain: Secondary | ICD-10-CM

## 2019-06-10 DIAGNOSIS — R300 Dysuria: Secondary | ICD-10-CM

## 2019-06-10 NOTE — Progress Notes (Signed)
Thank you for the details you included in the comment boxes. Those details are very helpful in determining the best course of treatment for you and help [place] to provide the best care.  Due to your symptoms, we must perform a physical exam and lab work for safety reasons to ensure the infection has not moved up your urinary tract. This is the standard of care and cannot be performed remotely.   Based on what you shared with me, I feel your condition warrants further evaluation and I recommend that you be seen for a face to face office visit.  NOTE: If you entered your credit card information for this eVisit, you will not be charged. You may see a "hold" on your card for the $35 but that hold will drop off and you will not have a charge processed.  If you are having a true medical emergency please call 911.     For an urgent face to face visit, [facility] has five urgent care centers for your convenience:    [email or web address] to reserve your spot online an avoid wait times  [facility] [address], [address] [place], [place] [address] Modified hours of operation: Monday-Friday, 12 PM to 6 PM  Closed Saturday & Sunday  *Across the street from [place] (New Address!) [address], [place], [place] [address] *Just off [address], across the road from [place] hours of operation: Monday-Friday, 12 PM to 6 PM  Closed Saturday & Sunday   The following sites will take your insurance:  . [facility]    [phone]                  Get Driving Directions  [address], [place] [address] . 10 am to 8 pm Monday-Friday . 12 pm to 8 pm Saturday-Sunday   . [facility] at [place]                  Get Driving Directions  [address], [place], [place] [address] . 8 am to 8 pm Monday-Friday . 9 am to 6 pm Saturday .  11 am to 6 pm Sunday   . [facility] at [place]                  Get Driving Directions   [address] [place], [place] [address] . 8 am to 8 pm Monday-Friday . 8 am to 4 pm Saturday-Sunday    . [facility] at [place]                    Get Driving Directions  [phone]  [address], [place], [place] [address]  . Monday-Friday, 12 PM to 6 PM    Your e-visit answers were reviewed by a board certified advanced clinical practitioner to complete your personal care plan.  Thank you for using e-Visits.

## 2019-06-16 ENCOUNTER — Other Ambulatory Visit: Payer: Self-pay | Admitting: Family Medicine

## 2019-06-16 MED ORDER — DOXAZOSIN MESYLATE 2 MG PO TABS: 2.0000 mg | ORAL_TABLET | Freq: Every day | ORAL | 5 refills | Status: DC

## 2019-07-05 ENCOUNTER — Other Ambulatory Visit: Payer: Self-pay | Admitting: Family Medicine

## 2019-07-05 MED ORDER — METFORMIN HCL 500 MG PO TABS: ORAL_TABLET | ORAL | 1 refills | Status: DC

## 2019-07-05 MED ORDER — FEXOFENADINE HCL 180 MG PO TABS: 180.0000 mg | ORAL_TABLET | Freq: Every day | ORAL | 3 refills | Status: DC

## 2019-07-05 MED ORDER — MONTELUKAST SODIUM 10 MG PO TABS: 10.0000 mg | ORAL_TABLET | Freq: Every day | ORAL | 3 refills | Status: DC

## 2019-07-05 MED ORDER — VENLAFAXINE HCL ER 150 MG PO CP24: ORAL_CAPSULE | ORAL | 3 refills | Status: DC

## 2019-07-05 MED ORDER — AMLODIPINE BESYLATE 10 MG PO TABS: 10.0000 mg | ORAL_TABLET | Freq: Every day | ORAL | 3 refills | Status: DC

## 2019-07-05 MED ORDER — DOXAZOSIN MESYLATE 2 MG PO TABS: 2.0000 mg | ORAL_TABLET | Freq: Every day | ORAL | 5 refills | Status: DC

## 2019-07-05 MED ORDER — METOPROLOL SUCCINATE ER 25 MG PO TB24: 25.0000 mg | ORAL_TABLET | Freq: Every day | ORAL | 3 refills | Status: DC

## 2019-07-05 MED ORDER — LOSARTAN POTASSIUM 50 MG PO TABS: 50.0000 mg | ORAL_TABLET | Freq: Every day | ORAL | 3 refills | Status: DC

## 2019-08-19 DIAGNOSIS — Z01419 Encounter for gynecological examination (general) (routine) without abnormal findings: Secondary | ICD-10-CM | POA: Diagnosis not present

## 2019-08-19 DIAGNOSIS — Z6841 Body Mass Index (BMI) 40.0 and over, adult: Secondary | ICD-10-CM | POA: Diagnosis not present

## 2019-08-19 DIAGNOSIS — N760 Acute vaginitis: Secondary | ICD-10-CM | POA: Diagnosis not present

## 2019-08-19 DIAGNOSIS — Z1231 Encounter for screening mammogram for malignant neoplasm of breast: Secondary | ICD-10-CM | POA: Diagnosis not present

## 2019-08-25 ENCOUNTER — Telehealth: Payer: Self-pay | Admitting: Family Medicine

## 2019-08-25 NOTE — Telephone Encounter (Signed)
Pt called and states that she is having pain with urination and wanted to know if you would call something in for her or does she need to come in for ov?

## 2019-08-26 ENCOUNTER — Other Ambulatory Visit: Payer: Self-pay | Admitting: Family Medicine

## 2019-08-26 MED ORDER — CIPROFLOXACIN HCL 500 MG PO TABS: 500.0000 mg | ORAL_TABLET | Freq: Two times a day (BID) | ORAL | 0 refills | Status: DC

## 2019-08-26 NOTE — Telephone Encounter (Signed)
Pt aware via vm

## 2019-08-26 NOTE — Telephone Encounter (Signed)
I sent Cipro to her pharmacy

## 2019-10-27 DIAGNOSIS — L400 Psoriasis vulgaris: Secondary | ICD-10-CM | POA: Diagnosis not present

## 2019-10-27 DIAGNOSIS — Z79899 Other long term (current) drug therapy: Secondary | ICD-10-CM | POA: Diagnosis not present

## 2019-10-29 DIAGNOSIS — E119 Type 2 diabetes mellitus without complications: Secondary | ICD-10-CM | POA: Diagnosis not present

## 2019-10-29 DIAGNOSIS — H5213 Myopia, bilateral: Secondary | ICD-10-CM | POA: Diagnosis not present

## 2019-10-29 DIAGNOSIS — H524 Presbyopia: Secondary | ICD-10-CM | POA: Diagnosis not present

## 2019-10-29 DIAGNOSIS — H25813 Combined forms of age-related cataract, bilateral: Secondary | ICD-10-CM | POA: Diagnosis not present

## 2019-10-29 DIAGNOSIS — H52203 Unspecified astigmatism, bilateral: Secondary | ICD-10-CM | POA: Diagnosis not present

## 2019-12-20 ENCOUNTER — Other Ambulatory Visit: Payer: Self-pay | Admitting: Family Medicine

## 2020-01-19 ENCOUNTER — Other Ambulatory Visit: Payer: Self-pay | Admitting: Family Medicine

## 2020-01-19 MED ORDER — METFORMIN HCL 1000 MG PO TABS: 1000.0000 mg | ORAL_TABLET | Freq: Two times a day (BID) | ORAL | 3 refills | Status: DC

## 2020-01-19 MED ORDER — METOPROLOL SUCCINATE ER 50 MG PO TB24: 50.0000 mg | ORAL_TABLET | Freq: Every day | ORAL | 3 refills | Status: DC

## 2020-01-19 MED ORDER — DOXAZOSIN MESYLATE 2 MG PO TABS: ORAL_TABLET | ORAL | 1 refills | Status: DC

## 2020-02-04 ENCOUNTER — Ambulatory Visit: Payer: Self-pay | Attending: Internal Medicine

## 2020-02-04 DIAGNOSIS — Z23 Encounter for immunization: Secondary | ICD-10-CM

## 2020-02-04 NOTE — Progress Notes (Signed)
   Covid-19 Vaccination Clinic  Name:  Caitlyn [name]    MRN: [id number] DOB: 20-Feb-1956  02/04/2020  [name] was observed post Covid-19 immunization for 15 minutes without incident. She was provided with Vaccine Information Sheet and instruction to access the V-Safe system.   [name] was instructed to call 911 with any severe reactions post vaccine: Marland Kitchen Difficulty breathing  . Swelling of face and throat  . A fast heartbeat  . A bad rash all over body  . Dizziness and weakness   Immunizations Administered    Name Date Dose VIS Date Route   Pfizer COVID-19 Vaccine 02/04/2020  8:18 AM 0.3 mL 11/05/2019 Intramuscular   Manufacturer: [place]   Lot: [id number]   [place]: [id number]

## 2020-02-18 ENCOUNTER — Other Ambulatory Visit: Payer: Self-pay

## 2020-02-28 ENCOUNTER — Ambulatory Visit: Payer: Self-pay | Attending: Internal Medicine

## 2020-02-28 DIAGNOSIS — Z23 Encounter for immunization: Secondary | ICD-10-CM

## 2020-02-28 NOTE — Progress Notes (Signed)
   Covid-19 Vaccination Clinic  Name:  SHAVONTAE GIBEAULT    MRN: [id number] DOB: 03-12-1956  02/28/2020  Ms. Bowland was observed post Covid-19 immunization for 15 minutes without incident. She was provided with Vaccine Information Sheet and instruction to access the V-Safe system.   Ms. Bellisario was instructed to call 911 with any severe reactions post vaccine: Marland Kitchen Difficulty breathing  . Swelling of face and throat  . A fast heartbeat  . A bad rash all over body  . Dizziness and weakness   Immunizations Administered    Name Date Dose VIS Date Route   Pfizer COVID-19 Vaccine 02/28/2020 11:21 AM 0.3 mL 11/05/2019 Intramuscular   Manufacturer: [place]   Lot: [id number]   [place]: [id number]

## 2020-04-26 DIAGNOSIS — Z79899 Other long term (current) drug therapy: Secondary | ICD-10-CM | POA: Diagnosis not present

## 2020-04-26 DIAGNOSIS — L400 Psoriasis vulgaris: Secondary | ICD-10-CM | POA: Diagnosis not present

## 2020-06-20 ENCOUNTER — Other Ambulatory Visit: Payer: Self-pay | Admitting: Family Medicine

## 2020-07-05 ENCOUNTER — Other Ambulatory Visit: Payer: Self-pay | Admitting: Family Medicine

## 2020-08-21 DIAGNOSIS — Z01419 Encounter for gynecological examination (general) (routine) without abnormal findings: Secondary | ICD-10-CM | POA: Diagnosis not present

## 2020-08-21 DIAGNOSIS — Z6841 Body Mass Index (BMI) 40.0 and over, adult: Secondary | ICD-10-CM | POA: Diagnosis not present

## 2020-08-21 DIAGNOSIS — Z1231 Encounter for screening mammogram for malignant neoplasm of breast: Secondary | ICD-10-CM | POA: Diagnosis not present

## 2020-10-26 DIAGNOSIS — R309 Painful micturition, unspecified: Secondary | ICD-10-CM | POA: Diagnosis not present

## 2020-10-26 DIAGNOSIS — J0100 Acute maxillary sinusitis, unspecified: Secondary | ICD-10-CM | POA: Diagnosis not present

## 2020-10-26 DIAGNOSIS — R350 Frequency of micturition: Secondary | ICD-10-CM | POA: Diagnosis not present

## 2020-10-26 DIAGNOSIS — N3000 Acute cystitis without hematuria: Secondary | ICD-10-CM | POA: Diagnosis not present

## 2020-10-31 ENCOUNTER — Telehealth: Payer: Self-pay | Admitting: *Deleted

## 2020-10-31 ENCOUNTER — Ambulatory Visit: Payer: 59 | Admitting: Family Medicine

## 2020-10-31 ENCOUNTER — Encounter: Payer: Self-pay | Admitting: Family Medicine

## 2020-10-31 ENCOUNTER — Other Ambulatory Visit: Payer: Self-pay

## 2020-10-31 VITALS — BP 118/76 | HR 103 | Temp 98.1°F | Resp 17 | Ht 67.0 in

## 2020-10-31 DIAGNOSIS — E1165 Type 2 diabetes mellitus with hyperglycemia: Secondary | ICD-10-CM | POA: Diagnosis not present

## 2020-10-31 DIAGNOSIS — J0100 Acute maxillary sinusitis, unspecified: Secondary | ICD-10-CM

## 2020-10-31 DIAGNOSIS — J45901 Unspecified asthma with (acute) exacerbation: Secondary | ICD-10-CM | POA: Diagnosis not present

## 2020-10-31 DIAGNOSIS — L400 Psoriasis vulgaris: Secondary | ICD-10-CM | POA: Diagnosis not present

## 2020-10-31 DIAGNOSIS — I10 Essential (primary) hypertension: Secondary | ICD-10-CM

## 2020-10-31 MED ORDER — GUAIFENESIN-CODEINE 100-10 MG/5ML PO SOLN: 5.0000 mL | Freq: Four times a day (QID) | ORAL | 0 refills | Status: DC | PRN

## 2020-10-31 MED ORDER — AMOXICILLIN-POT CLAVULANATE 875-125 MG PO TABS: 1.0000 | ORAL_TABLET | Freq: Two times a day (BID) | ORAL | 0 refills | Status: DC

## 2020-10-31 MED ORDER — ALBUTEROL SULFATE (2.5 MG/3ML) 0.083% IN NEBU
2.5000 mg | INHALATION_SOLUTION | Freq: Once | RESPIRATORY_TRACT | Status: AC
  Administered 2020-10-31: 2.5 mg via RESPIRATORY_TRACT

## 2020-10-31 MED ORDER — ALBUTEROL SULFATE HFA 108 (90 BASE) MCG/ACT IN AERS
2.0000 | INHALATION_SPRAY | RESPIRATORY_TRACT | 2 refills | Status: DC | PRN
Start: 2020-10-31 — End: 2022-09-02

## 2020-10-31 NOTE — Patient Instructions (Signed)
Take augmentin You can stop the cipro Finish the steroids Add robitussin cough medicine, albuterol inhaler given We will call with lab results F/U Dr. Dennard Schaumann- Schedule Physical

## 2020-10-31 NOTE — Telephone Encounter (Signed)
Received call from [facility]. States that patient is due for labs for Cosentyx injection. Reports that patient has recently had labs for PCP.   Requested to have labs faxed once resulted: Dr. Camillo Flaming  [phone]~ fax

## 2020-10-31 NOTE — Progress Notes (Signed)
Subjective:    Patient ID: Caitlyn [name], female    DOB: 10-09-1956, 64 y.o.   MRN: [id number]  Patient presents for Fatigue (pt diagnosed with sinus infection thursday from [facility], she was given antiinflamatory and cipro for treatment pt reports still not better at this time, pt complains struggling to breathe well, pt reports they did not test her for COVID, she has had the Covid vaccine as well as booster )  Pt here with fatigue, seen at [facility] for cystitis and sinsuitis  UTI syptoms treated with Cipro - she has 2 more days left   now has wheezing in chest, sinus drainage and pressure  productive, no fever, Right ear pain   She has had COVID-19 and flu shot done  She was given medrol dose pak ( had 2 more doses left)  also taking mucinex  She doeshave history of asthma, she does not have inhaler at home  She has been out of work since [place]   She still feels drained, with mostly respiratory symptoms, UTI symptoms have resolved   she feels tight in the chest right now   She has not been seen for her diabetes or othe rchronic medical problems   DM- currently on metformin 1068m twice a day, her last A1C Feb 2020 was  6.3%  She is also on Cosentyx - Psoriasis injection    Bowels are okay    Review Of Systems:  GEN- + fatigue, fever, weight loss,weakness, recent illness HEENT- denies eye drainage, change in vision, +nasal discharge, CVS- denies chest pain, palpitations RESP- denies SOB,+ cough,+ wheeze ABD- denies N/V, change in stools, abd pain GU- denies dysuria, hematuria, dribbling, incontinence MSK- denies joint pain, muscle aches, injury Neuro- denies headache, dizziness, syncope, seizure activity       Objective:    BP 118/76   Pulse (!) 103   Temp 98.1 F (36.7 C) (Temporal)   Resp 17   Ht 5' 7"  (1.702 m)   SpO2 96%   BMI 38.53 kg/m  GEN- NAD, alert and oriented x3 HEENT- PERRL, EOMI, non injected sclera, pink conjunctiva, MMM, oropharynx clear, TM  clear, no effusion, nares congested, mild sinus tenderness  Neck- Supple, no thyromegaly CVS- RR mild tachycardia , no murmur RESP-few expiratory wheeze, no retractions but breathing a little labored, sat normal No rales  ABD-NABS,soft,NT,ND EXT- No edema Pulses- Radial 2+        Assessment & Plan:      Problem List Items Addressed This Visit      Unprioritized   HTN (hypertension)    Controlled no changes       Relevant Orders   CBC with Differential/Platelet   Comprehensive metabolic panel   Type II diabetes mellitus, uncontrolled (HCC)    Check A1C, renal function to ensure not contributing to overall illness Titrate meds to goal A1C less than 7% Pt also on immunosuppressant for her psoriasis      Relevant Orders   Hemoglobin A1c    Other Visit Diagnoses    Moderate asthma with exacerbation, unspecified whether persistent    -  Primary   s/p neb improved WOB and wheeze, will complete steroids, add albuterol, robitussin codiene, continue allergy/asthma meds   Relevant Medications   albuterol (PROVENTIL) (2.5 MG/3ML) 0.083% nebulizer solution 2.5 mg (Completed)   albuterol (VENTOLIN HFA) 108 (90 Base) MCG/ACT inhaler   Acute maxillary sinusitis, recurrence not specified       D/C Cipro, change to augmentin  to cover sinusitis as well    Relevant Medications   guaiFENesin-codeine 100-10 MG/5ML syrup   amoxicillin-clavulanate (AUGMENTIN) 875-125 MG tablet      Note: This dictation was prepared with Dragon dictation along with smaller phrase technology. Any transcriptional errors that result from this process are unintentional.

## 2020-10-31 NOTE — Assessment & Plan Note (Signed)
Check A1C, renal function to ensure not contributing to overall illness Titrate meds to goal A1C less than 7% Pt also on immunosuppressant for her psoriasis

## 2020-10-31 NOTE — Assessment & Plan Note (Signed)
Controlled no changes

## 2020-11-01 LAB — CBC WITH DIFFERENTIAL/PLATELET
Absolute Monocytes: 517 cells/uL (ref 200–950)
Basophils Absolute: 74 cells/uL (ref 0–200)
Basophils Relative: 0.6 %
Eosinophils Absolute: 49 cells/uL (ref 15–500)
Eosinophils Relative: 0.4 %
HCT: 43.4 % (ref 35.0–45.0)
Hemoglobin: 14.8 g/dL (ref 11.7–15.5)
Lymphs Abs: 4145 cells/uL — ABNORMAL HIGH (ref 850–3900)
MCH: 30.1 pg (ref 27.0–33.0)
MCHC: 34.1 g/dL (ref 32.0–36.0)
MCV: 88.2 fL (ref 80.0–100.0)
MPV: 10 fL (ref 7.5–12.5)
Monocytes Relative: 4.2 %
Neutro Abs: 7515 cells/uL (ref 1500–7800)
Neutrophils Relative %: 61.1 %
Platelets: 347 10*3/uL (ref 140–400)
RBC: 4.92 10*6/uL (ref 3.80–5.10)
RDW: 12.5 % (ref 11.0–15.0)
Total Lymphocyte: 33.7 %
WBC: 12.3 10*3/uL — ABNORMAL HIGH (ref 3.8–10.8)

## 2020-11-01 LAB — COMPREHENSIVE METABOLIC PANEL
AG Ratio: 1.3 (calc) (ref 1.0–2.5)
ALT: 24 U/L (ref 6–29)
AST: 17 U/L (ref 10–35)
Albumin: 4.4 g/dL (ref 3.6–5.1)
Alkaline phosphatase (APISO): 83 U/L (ref 37–153)
BUN/Creatinine Ratio: 25 (calc) — ABNORMAL HIGH (ref 6–22)
BUN: 30 mg/dL — ABNORMAL HIGH (ref 7–25)
CO2: 25 mmol/L (ref 20–32)
Calcium: 9.5 mg/dL (ref 8.6–10.4)
Chloride: 100 mmol/L (ref 98–110)
Creat: 1.22 mg/dL — ABNORMAL HIGH (ref 0.50–0.99)
Globulin: 3.5 g/dL (calc) (ref 1.9–3.7)
Glucose, Bld: 248 mg/dL — ABNORMAL HIGH (ref 65–99)
Potassium: 4.9 mmol/L (ref 3.5–5.3)
Sodium: 136 mmol/L (ref 135–146)
Total Bilirubin: 0.4 mg/dL (ref 0.2–1.2)
Total Protein: 7.9 g/dL (ref 6.1–8.1)

## 2020-11-01 LAB — HEMOGLOBIN A1C
Hgb A1c MFr Bld: 7.1 % of total Hgb — ABNORMAL HIGH (ref <5.7)
Mean Plasma Glucose: 157 mg/dL
eAG (mmol/L): 8.7 mmol/L

## 2020-11-02 ENCOUNTER — Other Ambulatory Visit: Payer: Self-pay | Admitting: *Deleted

## 2020-11-02 DIAGNOSIS — E860 Dehydration: Secondary | ICD-10-CM

## 2020-11-02 DIAGNOSIS — N179 Acute kidney failure, unspecified: Secondary | ICD-10-CM

## 2020-11-02 NOTE — Telephone Encounter (Signed)
Results faxed.

## 2020-11-03 DIAGNOSIS — H524 Presbyopia: Secondary | ICD-10-CM | POA: Diagnosis not present

## 2020-11-03 DIAGNOSIS — H52203 Unspecified astigmatism, bilateral: Secondary | ICD-10-CM | POA: Diagnosis not present

## 2020-11-03 DIAGNOSIS — L400 Psoriasis vulgaris: Secondary | ICD-10-CM | POA: Diagnosis not present

## 2020-11-03 DIAGNOSIS — H5213 Myopia, bilateral: Secondary | ICD-10-CM | POA: Diagnosis not present

## 2020-11-03 DIAGNOSIS — Z7984 Long term (current) use of oral hypoglycemic drugs: Secondary | ICD-10-CM | POA: Diagnosis not present

## 2020-11-03 DIAGNOSIS — E119 Type 2 diabetes mellitus without complications: Secondary | ICD-10-CM | POA: Diagnosis not present

## 2020-11-03 DIAGNOSIS — H25813 Combined forms of age-related cataract, bilateral: Secondary | ICD-10-CM | POA: Diagnosis not present

## 2020-11-03 DIAGNOSIS — Z79899 Other long term (current) drug therapy: Secondary | ICD-10-CM | POA: Diagnosis not present

## 2020-11-09 ENCOUNTER — Other Ambulatory Visit: Payer: BC Managed Care – PPO

## 2020-11-09 ENCOUNTER — Other Ambulatory Visit: Payer: Self-pay

## 2020-11-09 DIAGNOSIS — N179 Acute kidney failure, unspecified: Secondary | ICD-10-CM | POA: Diagnosis not present

## 2020-11-09 DIAGNOSIS — E860 Dehydration: Secondary | ICD-10-CM | POA: Diagnosis not present

## 2020-11-10 ENCOUNTER — Encounter: Payer: Self-pay | Admitting: Family Medicine

## 2020-11-10 LAB — BASIC METABOLIC PANEL
BUN/Creatinine Ratio: 25 (calc) — ABNORMAL HIGH (ref 6–22)
BUN: 30 mg/dL — ABNORMAL HIGH (ref 7–25)
CO2: 26 mmol/L (ref 20–32)
Calcium: 9.7 mg/dL (ref 8.6–10.4)
Chloride: 105 mmol/L (ref 98–110)
Creat: 1.2 mg/dL — ABNORMAL HIGH (ref 0.50–0.99)
Glucose, Bld: 157 mg/dL — ABNORMAL HIGH (ref 65–99)
Potassium: 5.6 mmol/L — ABNORMAL HIGH (ref 3.5–5.3)
Sodium: 138 mmol/L (ref 135–146)

## 2020-12-01 ENCOUNTER — Encounter: Payer: Self-pay | Admitting: Family Medicine

## 2020-12-01 ENCOUNTER — Ambulatory Visit: Payer: BC Managed Care – PPO | Admitting: Family Medicine

## 2020-12-01 ENCOUNTER — Other Ambulatory Visit: Payer: Self-pay

## 2020-12-01 VITALS — BP 130/80 | HR 100 | Temp 98.7°F | Ht 67.0 in | Wt 268.0 lb

## 2020-12-01 DIAGNOSIS — E1165 Type 2 diabetes mellitus with hyperglycemia: Secondary | ICD-10-CM | POA: Diagnosis not present

## 2020-12-01 DIAGNOSIS — Z0001 Encounter for general adult medical examination with abnormal findings: Secondary | ICD-10-CM

## 2020-12-01 DIAGNOSIS — I10 Essential (primary) hypertension: Secondary | ICD-10-CM

## 2020-12-01 DIAGNOSIS — L409 Psoriasis, unspecified: Secondary | ICD-10-CM

## 2020-12-01 DIAGNOSIS — R1013 Epigastric pain: Secondary | ICD-10-CM | POA: Diagnosis not present

## 2020-12-01 DIAGNOSIS — Z8719 Personal history of other diseases of the digestive system: Secondary | ICD-10-CM

## 2020-12-01 DIAGNOSIS — Z0000 Encounter for general adult medical examination without abnormal findings: Secondary | ICD-10-CM

## 2020-12-01 MED ORDER — PANTOPRAZOLE SODIUM 40 MG PO TBEC: 40.0000 mg | DELAYED_RELEASE_TABLET | Freq: Every day | ORAL | 3 refills | Status: DC

## 2020-12-01 MED ORDER — CLOTRIMAZOLE-BETAMETHASONE 1-0.05 % EX CREA: 1.0000 "application " | TOPICAL_CREAM | Freq: Two times a day (BID) | CUTANEOUS | 0 refills | Status: DC

## 2020-12-01 NOTE — Progress Notes (Signed)
Subjective:    Patient ID: Caitlyn [name], female    DOB: December 05, 1955, 65 y.o.   MRN: [id number]  HPI Patient is a very pleasant 65 year old Caucasian female here today for complete physical exam.  She sees her gynecologist for her Pap smear and her mammogram.  This was done last fall and is up-to-date.  Her last colonoscopy was in 2019 and is up-to-date.  She has had her flu shot.  She has had all 3 doses of the Covid vaccine.  She had Pneumovax 23 at an outside pharmacy.  Diabetic eye exam is up-to-date although she does have cataracts and they have discussed cataract surgery.  Diabetic foot exam was performed today and is normal.  Recently she was found to have a elevation in her creatinine from her baseline of about 30%.  This was during an infection.  She is due to recheck that today.  She has not had a renal ultrasound.  Most recent lab work is listed below.  A1c is up to 7.1. Appointment on 11/09/2020  Component Date Value Ref Range Status  . Glucose, Bld 11/09/2020 157* 65 - 99 mg/dL Final   Comment: .            Fasting reference interval . For someone without known diabetes, a glucose value >125 mg/dL indicates that they may have diabetes and this should be confirmed with a follow-up test. .   . BUN 11/09/2020 30* 7 - 25 mg/dL Final  . Creat 11/09/2020 1.20* 0.50 - 0.99 mg/dL Final   Comment: For patients >34 years of age, the reference limit for Creatinine is approximately 13% higher for people identified as African-American. .   Havery Moros Ratio 11/09/2020 25* 6 - 22 (calc) Final  . Sodium 11/09/2020 138  135 - 146 mmol/L Final  . Potassium 11/09/2020 5.6* 3.5 - 5.3 mmol/L Final  . Chloride 11/09/2020 105  98 - 110 mmol/L Final  . CO2 11/09/2020 26  20 - 32 mmol/L Final  . Calcium 11/09/2020 9.7  8.6 - 10.4 mg/dL Final   Past Medical History:  Diagnosis Date  . Asthma   . Cold and cough --- no fever  . Complication of anesthesia    24 yrs ago post tubal-- pulm.  edema  (no problems post bowel surg. 2009)  . Depression   . Diabetes mellitus    oral med  . Diabetes mellitus without complication ([place])    Phreesia 11/28/2020  . Diverticulitis hx  -- w/ perf. bowel   s/p resection 2009  . Hypertension   . Psoriasis   . Reflux occasional   watches diet   Past Surgical History:  Procedure Laterality Date  . BOWEL RESECTION  2009   diverticulitis w/ perf. bowel  . COLON SURGERY N/A    Phreesia 11/28/2020  . HYSTEROSCOPY  10/09/2011   Procedure: HYSTEROSCOPY;  Surgeon: Margarette Asal;  Location: [facility];  Service: Gynecology;;  HYSTEROSCOPY D & C  . TUBAL LIGATION  24 yrs ago   Current Outpatient Medications on File Prior to Visit  Medication Sig Dispense Refill  . ACCU-CHEK FASTCLIX LANCETS MISC Tes bid 100 each 2  . albuterol (VENTOLIN HFA) 108 (90 Base) MCG/ACT inhaler Inhale 2 puffs into the lungs every 4 (four) hours as needed for wheezing or shortness of breath. 8 g 2  . amLODipine (NORVASC) 10 MG tablet TAKE 1 TABLET(10 MG) BY MOUTH DAILY 90 tablet 3  . blood glucose meter kit and  supplies Dispense based on patient and insurance preference. Check BS BID. (FOR ICD-10 E11.9). 1 each 0  . COSENTYX SENSOREADY 300 DOSE 150 MG/ML SOAJ     . doxazosin (CARDURA) 2 MG tablet TAKE 1 TABLET(2 MG) BY MOUTH DAILY 30 tablet 0  . fexofenadine (ALLEGRA) 180 MG tablet Take 1 tablet (180 mg total) by mouth daily. 90 tablet 3  . metFORMIN (GLUCOPHAGE) 1000 MG tablet Take 1 tablet (1,000 mg total) by mouth 2 (two) times daily with a meal. 180 tablet 3  . metoprolol succinate (TOPROL-XL) 25 MG 24 hr tablet Take 1 tablet (25 mg total) by mouth daily. 90 tablet 3  . montelukast (SINGULAIR) 10 MG tablet TAKE 1 TABLET(10 MG) BY MOUTH AT BEDTIME 90 tablet 3  . venlafaxine XR (EFFEXOR-XR) 150 MG 24 hr capsule TAKE 1 CAPSULE BY MOUTH EVERY DAY 90 capsule 3  . guaiFENesin-codeine 100-10 MG/5ML syrup Take 5 mLs by mouth every 6 (six) hours as  needed. 180 mL 0   No current facility-administered medications on file prior to visit.   Allergies  Allergen Reactions  . Erythromycin Other (See Comments)    Gi upset/ cramps  . Hctz [Hydrochlorothiazide]     CAUSED GOUT  . Macrolides And Ketolides Other (See Comments)    unknown   Social History   Socioeconomic History  . Marital status: Married    Spouse name: Not on file  . Number of children: Not on file  . Years of education: Not on file  . Highest education level: Not on file  Occupational History  . Not on file  Tobacco Use  . Smoking status: Never Smoker  . Smokeless tobacco: Never Used  Substance and Sexual Activity  . Alcohol use: No  . Drug use: No  . Sexual activity: Not on file  Other Topics Concern  . Not on file  Social History Narrative  . Not on file   Social Determinants of Health   Financial Resource Strain: Not on file  Food Insecurity: Not on file  Transportation Needs: Not on file  Physical Activity: Not on file  Stress: Not on file  Social Connections: Not on file  Intimate Partner Violence: Not on file      Review of Systems  All other systems reviewed and are negative.      Objective:   Physical Exam Vitals reviewed.  Constitutional:      General: She is not in acute distress.    Appearance: She is well-developed. She is not diaphoretic.  HENT:     Right Ear: External ear normal.     Left Ear: External ear normal.     Nose: Nose normal.     Mouth/Throat:     Pharynx: No oropharyngeal exudate.  Eyes:     Conjunctiva/sclera: Conjunctivae normal.  Neck:     Thyroid: No thyromegaly.     Vascular: No JVD.  Cardiovascular:     Rate and Rhythm: Normal rate and regular rhythm.     Heart sounds: Normal heart sounds. No murmur heard.   Pulmonary:     Effort: Pulmonary effort is normal. No respiratory distress.     Breath sounds: Normal breath sounds. No wheezing or rales.  Abdominal:     General: Bowel sounds are normal.  There is no distension.     Palpations: Abdomen is soft.     Tenderness: There is no abdominal tenderness. There is no guarding or rebound.  Musculoskeletal:     Cervical back:  Neck supple.  Lymphadenopathy:     Cervical: No cervical adenopathy.           Assessment & Plan:  Dyspepsia - Plan: Basic metabolic panel  Uncontrolled type 2 diabetes mellitus with hyperglycemia (HCC) - Plan: Lipid panel, Microalbumin/Creatinine Ratio, Urine  Primary hypertension  Psoriasis  Hx of Crohn's disease  General medical exam  Patient is reporting some nausea with meals that has been going on now for quite some time.  I suspect underlying gastritis.  Try Protonix 40 mg a day and then reassess in 3 to 4 weeks.  If not better I would recommend EGD.  Mammogram, colonoscopy, Pap smear up-to-date.  Diabetic foot exam and eye exam are up-to-date.  Check a lipid panel today.  Goal LDL cholesterol is less than 100.  Check microalbumin to creatinine ratio.  If greater than 30, would consider adding [place].  Otherwise patient has been missing her Metformin due to crashes in the middle of the day.  She will try to take the Metformin twice a day and then recheck her A1c in 3 months.  I will repeat a BMP today.  If the creatinine remains elevated I would recommend a renal ultrasound.  If the renal ultrasound is normal, I would expect that the rise in her creatinine could be due to to her longstanding diabetes.

## 2020-12-02 LAB — BASIC METABOLIC PANEL
BUN/Creatinine Ratio: 18 (calc) (ref 6–22)
BUN: 19 mg/dL (ref 7–25)
CO2: 27 mmol/L (ref 20–32)
Calcium: 9.8 mg/dL (ref 8.6–10.4)
Chloride: 104 mmol/L (ref 98–110)
Creat: 1.03 mg/dL — ABNORMAL HIGH (ref 0.50–0.99)
Glucose, Bld: 179 mg/dL — ABNORMAL HIGH (ref 65–99)
Potassium: 5.7 mmol/L — ABNORMAL HIGH (ref 3.5–5.3)
Sodium: 139 mmol/L (ref 135–146)

## 2020-12-02 LAB — MICROALBUMIN / CREATININE URINE RATIO
Creatinine, Urine: 91 mg/dL (ref 20–275)
Microalb Creat Ratio: 155 mcg/mg creat — ABNORMAL HIGH (ref <30)
Microalb, Ur: 14.1 mg/dL

## 2020-12-02 LAB — LIPID PANEL
Cholesterol: 168 mg/dL (ref <200)
HDL: 48 mg/dL — ABNORMAL LOW (ref >=50)
LDL Cholesterol (Calc): 100 mg/dL (calc) — ABNORMAL HIGH
Non-HDL Cholesterol (Calc): 120 mg/dL (calc) (ref <130)
Total CHOL/HDL Ratio: 3.5 (calc) (ref <5.0)
Triglycerides: 108 mg/dL (ref <150)

## 2020-12-04 ENCOUNTER — Telehealth: Payer: Self-pay | Admitting: *Deleted

## 2020-12-04 MED ORDER — PANTOPRAZOLE SODIUM 40 MG PO TBEC: 40.0000 mg | DELAYED_RELEASE_TABLET | Freq: Every day | ORAL | 3 refills | Status: DC

## 2020-12-04 NOTE — Telephone Encounter (Signed)
Received request from pharmacy for PA on Pantoprazole.   PA submitted.   Dx: K21.9- GERD  Received immediate determination.   PA approved effective from 12/04/2020 through 12/03/2021.

## 2020-12-27 DIAGNOSIS — Z9842 Cataract extraction status, left eye: Secondary | ICD-10-CM | POA: Diagnosis not present

## 2020-12-27 DIAGNOSIS — Z48810 Encounter for surgical aftercare following surgery on the sense organs: Secondary | ICD-10-CM | POA: Diagnosis not present

## 2020-12-27 DIAGNOSIS — Z961 Presence of intraocular lens: Secondary | ICD-10-CM | POA: Diagnosis not present

## 2020-12-27 DIAGNOSIS — H2511 Age-related nuclear cataract, right eye: Secondary | ICD-10-CM | POA: Diagnosis not present

## 2020-12-27 DIAGNOSIS — H25012 Cortical age-related cataract, left eye: Secondary | ICD-10-CM | POA: Diagnosis not present

## 2020-12-27 DIAGNOSIS — H25811 Combined forms of age-related cataract, right eye: Secondary | ICD-10-CM | POA: Diagnosis not present

## 2020-12-27 DIAGNOSIS — H2512 Age-related nuclear cataract, left eye: Secondary | ICD-10-CM | POA: Diagnosis not present

## 2021-01-10 DIAGNOSIS — H2511 Age-related nuclear cataract, right eye: Secondary | ICD-10-CM | POA: Diagnosis not present

## 2021-01-24 ENCOUNTER — Encounter (INDEPENDENT_AMBULATORY_CARE_PROVIDER_SITE_OTHER): Payer: Self-pay

## 2021-02-09 ENCOUNTER — Encounter (INDEPENDENT_AMBULATORY_CARE_PROVIDER_SITE_OTHER): Payer: Self-pay

## 2021-02-13 ENCOUNTER — Encounter (INDEPENDENT_AMBULATORY_CARE_PROVIDER_SITE_OTHER): Payer: Self-pay | Admitting: Ophthalmology

## 2021-02-13 ENCOUNTER — Telehealth: Payer: Self-pay | Admitting: *Deleted

## 2021-02-13 ENCOUNTER — Other Ambulatory Visit: Payer: Self-pay

## 2021-02-13 ENCOUNTER — Ambulatory Visit (INDEPENDENT_AMBULATORY_CARE_PROVIDER_SITE_OTHER): Payer: BC Managed Care – PPO | Admitting: Ophthalmology

## 2021-02-13 DIAGNOSIS — H35371 Puckering of macula, right eye: Secondary | ICD-10-CM | POA: Diagnosis not present

## 2021-02-13 DIAGNOSIS — R0683 Snoring: Secondary | ICD-10-CM

## 2021-02-13 DIAGNOSIS — R5383 Other fatigue: Secondary | ICD-10-CM

## 2021-02-13 NOTE — Telephone Encounter (Signed)
Yes please order

## 2021-02-13 NOTE — Telephone Encounter (Signed)
Orders placed.   Call placed to patient and patient made aware.

## 2021-02-13 NOTE — Assessment & Plan Note (Signed)

## 2021-02-13 NOTE — Progress Notes (Signed)
02/13/2021     CHIEF COMPLAINT Patient presents for Retina Evaluation (NP ERM OD - Ref'd by Dr. Janina Mayo c/o blurry VA OD, which she sts is affecting VA OS x 1 mo approx. Pt c/o fb sensation OD. Pt c/o HA off and on. Pt denies flashes or floaters OU. Pt is type II diabetic./A1c: 7.1, "several months ago"/LBS: "I don't check it daily. It has been a long time ago.")   HISTORY OF PRESENT ILLNESS: Caitlyn [name] is a 65 y.o. female who presents to the clinic today for:   HPI    Retina Evaluation    In right eye.  This started 1 month ago.  Duration of 1 month.  Context:  distance vision and mid-range vision.  Response to treatment was no improvement. Additional comments: NP ERM OD - Ref'd by Dr. Gershon Crane  Pt c/o blurry VA OD, which she sts is affecting VA OS x 1 mo approx. Pt c/o fb sensation OD. Pt c/o HA off and on. Pt denies flashes or floaters OU. Pt is type II diabetic. A1c: 7.1, "several months ago" LBS: "I don't check it daily. It has been a long time ago."       Last edited by Rockie Neighbours, [place] on 02/13/2021  2:47 PM. (History)      Referring physician: Rutherford Guys, [place],  [place] [id number]  HISTORICAL INFORMATION:   Selected notes from the MEDICAL RECORD NUMBER    Lab Results  Component Value Date   HGBA1C 7.1 (H) 10/31/2020     CURRENT MEDICATIONS: No current outpatient medications on file. (Ophthalmic Drugs)   No current facility-administered medications for this visit. (Ophthalmic Drugs)   Current Outpatient Medications (Other)  Medication Sig  . ACCU-CHEK FASTCLIX LANCETS MISC Tes bid  . albuterol (VENTOLIN HFA) 108 (90 Base) MCG/ACT inhaler Inhale 2 puffs into the lungs every 4 (four) hours as needed for wheezing or shortness of breath.  Marland Kitchen amLODipine (NORVASC) 10 MG tablet TAKE 1 TABLET(10 MG) BY MOUTH DAILY  . blood glucose meter kit and supplies Dispense based on patient and insurance preference. Check BS BID. (FOR ICD-10 E11.9).   . clotrimazole-betamethasone (LOTRISONE) cream Apply 1 application topically 2 (two) times daily.  Hillary Bow SENSOREADY 300 DOSE 150 MG/ML SOAJ   . doxazosin (CARDURA) 2 MG tablet TAKE 1 TABLET(2 MG) BY MOUTH DAILY  . fexofenadine (ALLEGRA) 180 MG tablet Take 1 tablet (180 mg total) by mouth daily.  Marland Kitchen guaiFENesin-codeine 100-10 MG/5ML syrup Take 5 mLs by mouth every 6 (six) hours as needed.  . metFORMIN (GLUCOPHAGE) 1000 MG tablet Take 1 tablet (1,000 mg total) by mouth 2 (two) times daily with a meal.  . metoprolol succinate (TOPROL-XL) 25 MG 24 hr tablet Take 1 tablet (25 mg total) by mouth daily.  . montelukast (SINGULAIR) 10 MG tablet TAKE 1 TABLET(10 MG) BY MOUTH AT BEDTIME  . pantoprazole (PROTONIX) 40 MG tablet Take 1 tablet (40 mg total) by mouth daily.  Marland Kitchen venlafaxine XR (EFFEXOR-XR) 150 MG 24 hr capsule TAKE 1 CAPSULE BY MOUTH EVERY DAY   No current facility-administered medications for this visit. (Other)      REVIEW OF SYSTEMS:    ALLERGIES Allergies  Allergen Reactions  . Erythromycin Other (See Comments)    Gi upset/ cramps  . Hctz [Hydrochlorothiazide]     CAUSED GOUT  . Macrolides And Ketolides Other (See Comments)    unknown    PAST MEDICAL HISTORY Past Medical History:  Diagnosis Date  . Asthma   . Cold and cough --- no fever  . Complication of anesthesia    24 yrs ago post tubal-- pulm. edema  (no problems post bowel surg. 2009)  . Depression   . Diabetes mellitus    oral med  . Diabetes mellitus without complication ([place])    Phreesia 11/28/2020  . Diverticulitis hx  -- w/ perf. bowel   s/p resection 2009  . Hypertension   . Psoriasis   . Reflux occasional   watches diet   Past Surgical History:  Procedure Laterality Date  . BOWEL RESECTION  2009   diverticulitis w/ perf. bowel  . COLON SURGERY N/A    Phreesia 11/28/2020  . HYSTEROSCOPY  10/09/2011   Procedure: HYSTEROSCOPY;  Surgeon: Margarette Asal;  Location: [facility];  Service: Gynecology;;  HYSTEROSCOPY D & C  . TUBAL LIGATION  24 yrs ago    FAMILY HISTORY History reviewed. No pertinent family history.  SOCIAL HISTORY Social History   Tobacco Use  . Smoking status: Never Smoker  . Smokeless tobacco: Never Used  Substance Use Topics  . Alcohol use: No  . Drug use: No         OPHTHALMIC EXAM:  Base Eye Exam    Visual Acuity (ETDRS)      Right Left   Dist [place] 20/80ecc +1 20/40   Dist ph [place] 20/50 +2 20/25       Tonometry (Tonopen, 2:42 PM)      Right Left   Pressure 15 13       Pupils      Pupils Dark Light Shape React APD   Right PERRL 5 4 Round Brisk None   Left PERRL 5 4 Round Brisk None       Visual Fields (Counting fingers)      Left Right    Full Full       Extraocular Movement      Right Left    Full Full       Neuro/Psych    Oriented x3: Yes   Mood/Affect: Normal       Dilation    Both eyes: 1.0% Mydriacyl, 2.5% Phenylephrine @ 2:48 PM        Slit Lamp and Fundus Exam    External Exam      Right Left   External Normal Normal       Slit Lamp Exam      Right Left   Lids/Lashes Normal Normal   Conjunctiva/Sclera White and quiet White and quiet   Cornea Clear Clear   Anterior Chamber Deep and quiet Deep and quiet   Iris Round and reactive Round and reactive   Lens Centered posterior chamber intraocular lens Centered posterior chamber intraocular lens   Anterior Vitreous Normal Normal       Fundus Exam      Right Left   Posterior Vitreous Central vitreous floaters, , Posterior vitreous detachment Normal   Disc Normal Normal   C/D Ratio 0.45 0.45   Macula Epiretinal membrane, moderate Normal   Vessels Normal Normal   Periphery Normal Normal          IMAGING AND PROCEDURES  Imaging and Procedures for 02/13/21  OCT, Retina - OU - Both Eyes       Right Eye Quality was good. Scan locations included subfoveal. Central Foveal Thickness: 401. Progression has no prior data. Findings  include epiretinal membrane.   Left Eye Quality  was good. Scan locations included subfoveal. Central Foveal Thickness: 299. Progression has no prior data. Findings include normal foveal contour.   Notes Macular thickening OD coincident with acuity decline, no central CME nor intraretinal fluid  OS normal                ASSESSMENT/PLAN:  Macular pucker, right eye The nature of macular pucker (epiretinal membrane ERM) was discussed with the patient as well as threshold criteria for vitrectomy surgery. I explained that in rare cases another surgery is needed to actually remove a second wrinkle should it regrow.  Most often, the epiretinal membrane and underlying wrinkled internal limiting membrane are removed with the first surgery, to accomplish the goals.   If the operative eye is [place] (natural lens still present), cataract surgery is often recommended prior to Vitrectomy. This will enable the retina surgeon to have the best view during surgery and the patient to obtain optimal results in the future. Treatment options were discussed.      ICD-10-CM   1. Macular pucker, right eye  H35.371 OCT, Retina - OU - Both Eyes    1.  I discussed with the patient and use the analogy of scotch tape on a film that the epiretinal membrane which creates retinal thickening and distortion leading to her decline slow decline in vision.  In the absence of cystoid macular edema medical therapy alone would not not be helpful.  I explained the patient that surgical removal begins the healing process.  I explained that the surgery tends to be about 15 minutes in duration at most 30.  She would have a patch on the eye one night with follow-up postoperative day #1 #8 and typically 5 to 6 weeks later after surgery while using topical drops for around 3 weeks total in the right eye.  2.  Incidentally patient has a positive review of systems for possible sleep apnea, with body habitus, with awakening more than 2  times a night with nocturia as well as a witnessed snoring by her spouse  3.  Patient to follow-up with private medical doctor to arrange at home testing for sleep apnea  Ophthalmic Meds Ordered this visit:  No orders of the defined types were placed in this encounter.      Return [facility], for Schedule vitrectomy, membrane peel, OD.  There are no Patient Instructions on file for this visit.   Explained the diagnoses, plan, and follow up with the patient and they expressed understanding.  Patient expressed understanding of the importance of proper follow up care.   Clent Demark Rankin M.D. Diseases & Surgery of the Retina and Vitreous Retina & Diabetic [place] 02/13/21     Abbreviations: M myopia (nearsighted); A astigmatism; H hyperopia (farsighted); P presbyopia; Mrx spectacle prescription;  CTL contact lenses; OD right eye; OS left eye; OU both eyes  XT exotropia; ET esotropia; PEK punctate epithelial keratitis; PEE punctate epithelial erosions; DES dry eye syndrome; MGD meibomian gland dysfunction; ATs artificial tears; PFAT's preservative free artificial tears; [place] nuclear sclerotic cataract; PSC posterior subcapsular cataract; ERM epi-retinal membrane; PVD posterior vitreous detachment; RD retinal detachment; DM diabetes mellitus; DR diabetic retinopathy; NPDR non-proliferative diabetic retinopathy; PDR proliferative diabetic retinopathy; CSME clinically significant macular edema; DME diabetic macular edema; dbh dot blot hemorrhages; CWS cotton wool spot; POAG primary open angle glaucoma; C/D cup-to-disc ratio; HVF humphrey visual field; GVF goldmann visual field; OCT optical coherence tomography; IOP intraocular pressure; BRVO Branch retinal vein occlusion; CRVO  central retinal vein occlusion; CRAO central retinal artery occlusion; BRAO branch retinal artery occlusion; RT retinal tear; SB scleral buckle; PPV pars plana vitrectomy; VH Vitreous hemorrhage; PRP panretinal  laser photocoagulation; IVK intravitreal kenalog; VMT vitreomacular traction; MH Macular hole;  NVD neovascularization of the disc; NVE neovascularization elsewhere; AREDS age related eye disease study; ARMD age related macular degeneration; POAG primary open angle glaucoma; EBMD epithelial/anterior basement membrane dystrophy; ACIOL anterior chamber intraocular lens; IOL intraocular lens; PCIOL posterior chamber intraocular lens; Phaco/IOL phacoemulsification with intraocular lens placement; [place] photorefractive keratectomy; LASIK laser assisted in situ keratomileusis; HTN hypertension; DM diabetes mellitus; COPD chronic obstructive pulmonary disease

## 2021-02-13 NOTE — Telephone Encounter (Signed)
Received call from patient.   Reports that she has increased pressure in eye and swollen retina. States that she is to have surgery on eye, but ophthalmology feels that she may have OSA. Advised that surgery will be help until after evaluation for OSA.   States that partner does report excessive snoring, and she has woken up gasping for air like she had stopped breathing.   Requested home sleep study as she has difficulty falling asleep in other settings.   Ok to order?

## 2021-02-19 ENCOUNTER — Ambulatory Visit: Payer: BC Managed Care – PPO | Admitting: Family Medicine

## 2021-02-20 ENCOUNTER — Encounter: Payer: Self-pay | Admitting: Family Medicine

## 2021-02-20 ENCOUNTER — Encounter (INDEPENDENT_AMBULATORY_CARE_PROVIDER_SITE_OTHER): Payer: Self-pay | Admitting: Ophthalmology

## 2021-02-28 ENCOUNTER — Other Ambulatory Visit: Payer: Self-pay

## 2021-02-28 ENCOUNTER — Ambulatory Visit (INDEPENDENT_AMBULATORY_CARE_PROVIDER_SITE_OTHER): Payer: BC Managed Care – PPO

## 2021-02-28 ENCOUNTER — Encounter (INDEPENDENT_AMBULATORY_CARE_PROVIDER_SITE_OTHER): Payer: Self-pay

## 2021-02-28 DIAGNOSIS — H35371 Puckering of macula, right eye: Secondary | ICD-10-CM

## 2021-02-28 MED ORDER — OFLOXACIN 0.3 % OP SOLN: 1.0000 [drp] | Freq: Four times a day (QID) | OPHTHALMIC | 0 refills | Status: AC

## 2021-02-28 MED ORDER — PREDNISOLONE ACETATE 1 % OP SUSP: 1.0000 [drp] | Freq: Four times a day (QID) | OPHTHALMIC | 0 refills | Status: AC

## 2021-02-28 NOTE — Progress Notes (Signed)
  02/28/2021     CHIEF COMPLAINT Patient presents for Pre-op Exam (Pre op PPV/MP OD 03/07/21)   HISTORY OF PRESENT ILLNESS: Caitlyn [name] is a 65 y.o. female who presents to the clinic today for:   HPI    Pre-op Exam     Additional comments: Pre op PPV/MP OD 03/07/21       Last edited by , , COA on 02/28/2021  1:54 PM. (History)        HISTORICAL INFORMATION:   Selected notes from the medical record:     Lab Results  Component Value Date   HGBA1C 7.1 (H) 10/31/2020     CURRENT MEDICATIONS: No current outpatient medications on file. (Ophthalmic Drugs)   No current facility-administered medications for this visit. (Ophthalmic Drugs)   Current Outpatient Medications (Other)  Medication Sig  . ACCU-CHEK FASTCLIX LANCETS MISC Tes bid  . albuterol (VENTOLIN HFA) 108 (90 Base) MCG/ACT inhaler Inhale 2 puffs into the lungs every 4 (four) hours as needed for wheezing or shortness of breath.  . amLODipine (NORVASC) 10 MG tablet TAKE 1 TABLET(10 MG) BY MOUTH DAILY  . blood glucose meter kit and supplies Dispense based on patient and insurance preference. Check BS BID. (FOR ICD-10 E11.9).  . clotrimazole-betamethasone (LOTRISONE) cream Apply 1 application topically 2 (two) times daily.  . COSENTYX SENSOREADY 300 DOSE 150 MG/ML SOAJ   . doxazosin (CARDURA) 2 MG tablet TAKE 1 TABLET(2 MG) BY MOUTH DAILY  . fexofenadine (ALLEGRA) 180 MG tablet Take 1 tablet (180 mg total) by mouth daily.  . guaiFENesin-codeine 100-10 MG/5ML syrup Take 5 mLs by mouth every 6 (six) hours as needed.  . metFORMIN (GLUCOPHAGE) 1000 MG tablet Take 1 tablet (1,000 mg total) by mouth 2 (two) times daily with a meal.  . metoprolol succinate (TOPROL-XL) 25 MG 24 hr tablet Take 1 tablet (25 mg total) by mouth daily.  . montelukast (SINGULAIR) 10 MG tablet TAKE 1 TABLET(10 MG) BY MOUTH AT BEDTIME  . pantoprazole (PROTONIX) 40 MG tablet Take 1 tablet (40 mg total) by mouth daily.  . venlafaxine XR  (EFFEXOR-XR) 150 MG 24 hr capsule TAKE 1 CAPSULE BY MOUTH EVERY DAY   No current facility-administered medications for this visit. (Other)     ALLERGIES Allergies  Allergen Reactions  . Erythromycin Other (See Comments)    Gi upset/ cramps  . Hctz [Hydrochlorothiazide]     CAUSED GOUT  . Macrolides And Ketolides Other (See Comments)    unknown    PAST MEDICAL HISTORY Past Medical History:  Diagnosis Date  . Asthma   . Cold and cough --- no fever  . Complication of anesthesia    24 yrs ago post tubal-- pulm. edema  (no problems post bowel surg. 2009)  . Depression   . Diabetes mellitus    oral med  . Diabetes mellitus without complication (HCC)    Phreesia 11/28/2020  . Diverticulitis hx  -- w/ perf. bowel   s/p resection 2009  . Hypertension   . Psoriasis   . Reflux occasional   watches diet   Past Surgical History:  Procedure Laterality Date  . BOWEL RESECTION  2009   diverticulitis w/ perf. bowel  . COLON SURGERY N/A    Phreesia 11/28/2020  . HYSTEROSCOPY  10/09/2011   Procedure: HYSTEROSCOPY;  Surgeon: Richard M Holland;  Location: [facility];  Service: Gynecology;;  HYSTEROSCOPY D & C  . TUBAL LIGATION  24 yrs ago    FAMILY   HISTORY History reviewed. No pertinent family history.  SOCIAL HISTORY Social History   Tobacco Use  . Smoking status: Never Smoker  . Smokeless tobacco: Never Used  Substance Use Topics  . Alcohol use: No  . Drug use: No         OPHTHALMIC EXAM: Base Eye Exam    Visual Acuity (ETDRS)      Right Left   Dist [place] 20/50 -2    Dist ph [place] NI        Tonometry    Tonopen, 1:54 PM          IMAGING AND PROCEDURES  Imaging and Procedures for @TODAY@           ASSESSMENT/PLAN:  No diagnosis found.  Ophthalmic Meds Ordered this visit:  No orders of the defined types were placed in this encounter.       Pre-op completed. Operative consent obtained with pre-op eye drops reviewed with Caitlyn [name] and sent via eRX as needed. Post op instructions reviewed with patient and per patient all questions answered.   , COA     

## 2021-03-07 ENCOUNTER — Encounter (AMBULATORY_SURGERY_CENTER): Payer: BC Managed Care – PPO | Admitting: Ophthalmology

## 2021-03-07 DIAGNOSIS — H35371 Puckering of macula, right eye: Secondary | ICD-10-CM

## 2021-03-08 ENCOUNTER — Encounter (INDEPENDENT_AMBULATORY_CARE_PROVIDER_SITE_OTHER): Payer: Self-pay | Admitting: Ophthalmology

## 2021-03-08 ENCOUNTER — Ambulatory Visit (INDEPENDENT_AMBULATORY_CARE_PROVIDER_SITE_OTHER): Payer: BC Managed Care – PPO | Admitting: Ophthalmology

## 2021-03-08 ENCOUNTER — Other Ambulatory Visit: Payer: Self-pay

## 2021-03-08 DIAGNOSIS — H35371 Puckering of macula, right eye: Secondary | ICD-10-CM

## 2021-03-08 NOTE — Patient Instructions (Signed)
Ofloxacin  4 times daily to the operative eye  Prednisolone acetate 1 drop to the operative eye 4 times daily  Patient instructed not to refill the medications and use them for maximum of 3 weeks.  Patient instructed do not rub the eye.  Patient has the option to use the patch at night.  Patient to restart these medications today and to use them for a total of 3 more weeks.  She is not to refill the medication should she run out before that time.  She is not to use them beyond 3 weeks.

## 2021-03-08 NOTE — Progress Notes (Signed)
03/08/2021     CHIEF COMPLAINT Patient presents for Post-op Follow-up (1 day PO OD sx 03/07/2021//Pt states, " My eye is sore but not too bad. It seems like it is watering a lot."//)   HISTORY OF PRESENT ILLNESS: Caitlyn [name] is a 65 y.o. female who presents to the clinic today for:   HPI    Post-op Follow-up    In right eye.  Discomfort includes tearing.  Negative for pain.  Vision is stable. Additional comments: 1 day PO OD sx 03/07/2021  Pt states, " My eye is sore but not too bad. It seems like it is watering a lot."         Last edited by Kendra Opitz, COA on 03/08/2021  8:46 AM. (History)      Referring physician: Susy Frizzle, MD [address] [place],  [place] [address]  HISTORICAL INFORMATION:   Selected notes from the MEDICAL RECORD NUMBER    Lab Results  Component Value Date   HGBA1C 7.1 (H) 10/31/2020     CURRENT MEDICATIONS: Current Outpatient Medications (Ophthalmic Drugs)  Medication Sig  . ofloxacin (OCUFLOX) 0.3 % ophthalmic solution Place 1 drop into the right eye 4 (four) times daily for 21 days.  . prednisoLONE acetate (PRED FORTE) 1 % ophthalmic suspension Place 1 drop into the right eye 4 (four) times daily for 21 days.   No current facility-administered medications for this visit. (Ophthalmic Drugs)   Current Outpatient Medications (Other)  Medication Sig  . ACCU-CHEK FASTCLIX LANCETS MISC Tes bid  . albuterol (VENTOLIN HFA) 108 (90 Base) MCG/ACT inhaler Inhale 2 puffs into the lungs every 4 (four) hours as needed for wheezing or shortness of breath.  Marland Kitchen amLODipine (NORVASC) 10 MG tablet TAKE 1 TABLET(10 MG) BY MOUTH DAILY  . blood glucose meter kit and supplies Dispense based on patient and insurance preference. Check BS BID. (FOR ICD-10 E11.9).  . clotrimazole-betamethasone (LOTRISONE) cream Apply 1 application topically 2 (two) times daily.  Hillary Bow SENSOREADY 300 DOSE 150 MG/ML SOAJ   . doxazosin (CARDURA) 2 MG tablet TAKE  1 TABLET(2 MG) BY MOUTH DAILY  . fexofenadine (ALLEGRA) 180 MG tablet Take 1 tablet (180 mg total) by mouth daily.  Marland Kitchen guaiFENesin-codeine 100-10 MG/5ML syrup Take 5 mLs by mouth every 6 (six) hours as needed.  . metFORMIN (GLUCOPHAGE) 1000 MG tablet Take 1 tablet (1,000 mg total) by mouth 2 (two) times daily with a meal.  . metoprolol succinate (TOPROL-XL) 25 MG 24 hr tablet Take 1 tablet (25 mg total) by mouth daily.  . montelukast (SINGULAIR) 10 MG tablet TAKE 1 TABLET(10 MG) BY MOUTH AT BEDTIME  . pantoprazole (PROTONIX) 40 MG tablet Take 1 tablet (40 mg total) by mouth daily.  Marland Kitchen venlafaxine XR (EFFEXOR-XR) 150 MG 24 hr capsule TAKE 1 CAPSULE BY MOUTH EVERY DAY   No current facility-administered medications for this visit. (Other)      REVIEW OF SYSTEMS:    ALLERGIES Allergies  Allergen Reactions  . Erythromycin Other (See Comments)    Gi upset/ cramps  . Hctz [Hydrochlorothiazide]     CAUSED GOUT  . Macrolides And Ketolides Other (See Comments)    unknown    PAST MEDICAL HISTORY Past Medical History:  Diagnosis Date  . Asthma   . Cold and cough --- no fever  . Complication of anesthesia    24 yrs ago post tubal-- pulm. edema  (no problems post bowel surg. 2009)  .  Depression   . Diabetes mellitus    oral med  . Diabetes mellitus without complication ([place])    Phreesia 11/28/2020  . Diverticulitis hx  -- w/ perf. bowel   s/p resection 2009  . Hypertension   . Psoriasis   . Reflux occasional   watches diet   Past Surgical History:  Procedure Laterality Date  . BOWEL RESECTION  2009   diverticulitis w/ perf. bowel  . COLON SURGERY N/A    Phreesia 11/28/2020  . HYSTEROSCOPY  10/09/2011   Procedure: HYSTEROSCOPY;  Surgeon: Margarette Asal;  Location: [facility];  Service: Gynecology;;  HYSTEROSCOPY D & C  . TUBAL LIGATION  24 yrs ago    FAMILY HISTORY History reviewed. No pertinent family history.  SOCIAL HISTORY Social History    Tobacco Use  . Smoking status: Never Smoker  . Smokeless tobacco: Never Used  Substance Use Topics  . Alcohol use: No  . Drug use: No         OPHTHALMIC EXAM: Base Eye Exam    Visual Acuity (ETDRS)      Right Left   Dist [place] 20/60 20/40   Dist ph [place] NI        Tonometry (Tonopen, 8:52 AM)      Right Left   Pressure 16 11       Pupils      Dark Light Shape React APD   Right 8 8 Round Dilated None   Left 5 4 Round Brisk None       Neuro/Psych    Oriented x3: Yes   Mood/Affect: Normal       Dilation    Right eye: 1.0% Mydriacyl, 2.5% Phenylephrine @ 8:52 AM        Slit Lamp and Fundus Exam    External Exam      Right Left   External Normal Normal       Slit Lamp Exam      Right Left   Lids/Lashes Normal Normal   Conjunctiva/Sclera White and quiet White and quiet   Cornea Clear Clear   Anterior Chamber Deep and quiet Deep and quiet   Iris Round and reactive Round and reactive   Lens Centered posterior chamber intraocular lens Centered posterior chamber intraocular lens   Anterior Vitreous Normal Normal       Fundus Exam      Right Left   Posterior Vitreous Normal, clear and avitric    Disc Normal    C/D Ratio 0.45    Macula No topographic distortion, post ILM removal changes    Vessels Normal    Periphery Normal           IMAGING AND PROCEDURES  Imaging and Procedures for 03/08/21           ASSESSMENT/PLAN:  Macular pucker, right eye Postop day #1 looks great, no topographic distortion to the macula.  Clear avitric media      ICD-10-CM   1. Macular pucker, right eye  H35.371     1.  OD looks great, good acuity postop day #1.  I explained to the patient that visual acuity now has a chance to continue a slow improvement.  She has an option to use 1 or 2 fish oil tablets a day that may assist in healing.  2.  3.  Ophthalmic Meds Ordered this visit:  No orders of the defined types were placed in this encounter.       Return  in about 4 days (around 03/12/2021) for POST OP, OD, OCT.  Patient Instructions  Ofloxacin  4 times daily to the operative eye  Prednisolone acetate 1 drop to the operative eye 4 times daily  Patient instructed not to refill the medications and use them for maximum of 3 weeks.  Patient instructed do not rub the eye.  Patient has the option to use the patch at night.  Patient to restart these medications today and to use them for a total of 3 more weeks.  She is not to refill the medication should she run out before that time.  She is not to use them beyond 3 weeks.    Explained the diagnoses, plan, and follow up with the patient and they expressed understanding.  Patient expressed understanding of the importance of proper follow up care.   Clent Demark Deklin Bieler M.D. Diseases & Surgery of the Retina and Vitreous Retina & Diabetic [place] 03/08/21     Abbreviations: M myopia (nearsighted); A astigmatism; H hyperopia (farsighted); P presbyopia; Mrx spectacle prescription;  CTL contact lenses; OD right eye; OS left eye; OU both eyes  XT exotropia; ET esotropia; PEK punctate epithelial keratitis; PEE punctate epithelial erosions; DES dry eye syndrome; MGD meibomian gland dysfunction; ATs artificial tears; PFAT's preservative free artificial tears; [place] nuclear sclerotic cataract; PSC posterior subcapsular cataract; ERM epi-retinal membrane; PVD posterior vitreous detachment; RD retinal detachment; DM diabetes mellitus; DR diabetic retinopathy; NPDR non-proliferative diabetic retinopathy; PDR proliferative diabetic retinopathy; CSME clinically significant macular edema; DME diabetic macular edema; dbh dot blot hemorrhages; CWS cotton wool spot; POAG primary open angle glaucoma; C/D cup-to-disc ratio; HVF humphrey visual field; GVF goldmann visual field; OCT optical coherence tomography; IOP intraocular pressure; BRVO Branch retinal vein occlusion; CRVO central retinal vein occlusion; CRAO  central retinal artery occlusion; BRAO branch retinal artery occlusion; RT retinal tear; SB scleral buckle; PPV pars plana vitrectomy; VH Vitreous hemorrhage; PRP panretinal laser photocoagulation; IVK intravitreal kenalog; VMT vitreomacular traction; MH Macular hole;  NVD neovascularization of the disc; NVE neovascularization elsewhere; AREDS age related eye disease study; ARMD age related macular degeneration; POAG primary open angle glaucoma; EBMD epithelial/anterior basement membrane dystrophy; ACIOL anterior chamber intraocular lens; IOL intraocular lens; PCIOL posterior chamber intraocular lens; Phaco/IOL phacoemulsification with intraocular lens placement; [place] photorefractive keratectomy; LASIK laser assisted in situ keratomileusis; HTN hypertension; DM diabetes mellitus; COPD chronic obstructive pulmonary disease

## 2021-03-08 NOTE — Assessment & Plan Note (Signed)
Postop day #1 looks great, no topographic distortion to the macula.  Clear avitric media

## 2021-03-12 ENCOUNTER — Other Ambulatory Visit: Payer: Self-pay

## 2021-03-12 ENCOUNTER — Ambulatory Visit (INDEPENDENT_AMBULATORY_CARE_PROVIDER_SITE_OTHER): Payer: BC Managed Care – PPO | Admitting: Ophthalmology

## 2021-03-12 ENCOUNTER — Encounter (INDEPENDENT_AMBULATORY_CARE_PROVIDER_SITE_OTHER): Payer: Self-pay | Admitting: Ophthalmology

## 2021-03-12 DIAGNOSIS — H35371 Puckering of macula, right eye: Secondary | ICD-10-CM | POA: Diagnosis not present

## 2021-03-12 NOTE — Assessment & Plan Note (Signed)
5 days post vitrectomy membrane peel right eye.  Patient on topical medications, will complete these medications or 3 weeks whichever comes first without refills.

## 2021-03-12 NOTE — Progress Notes (Signed)
03/12/2021     CHIEF COMPLAINT Patient presents for Post-op Follow-up (4 Day POV OD//Pt reports continued tenderness OD. Pt sts VA has improved OD. )   HISTORY OF PRESENT ILLNESS: Caitlyn [name] is a 65 y.o. female who presents to the clinic today for:   HPI    Post-op Follow-up    In right eye.  Vision is improved. Additional comments: 4 Day POV OD  Pt reports continued tenderness OD. Pt sts VA has improved OD.        Last edited by Rockie Neighbours, [place] on 03/12/2021  9:09 AM. (History)      Referring physician: Susy Frizzle, MD [address] [place],  [place] [address]  HISTORICAL INFORMATION:   Selected notes from the MEDICAL RECORD NUMBER    Lab Results  Component Value Date   HGBA1C 7.1 (H) 10/31/2020     CURRENT MEDICATIONS: Current Outpatient Medications (Ophthalmic Drugs)  Medication Sig  . ofloxacin (OCUFLOX) 0.3 % ophthalmic solution Place 1 drop into the right eye 4 (four) times daily for 21 days.  . prednisoLONE acetate (PRED FORTE) 1 % ophthalmic suspension Place 1 drop into the right eye 4 (four) times daily for 21 days.   No current facility-administered medications for this visit. (Ophthalmic Drugs)   Current Outpatient Medications (Other)  Medication Sig  . ACCU-CHEK FASTCLIX LANCETS MISC Tes bid  . albuterol (VENTOLIN HFA) 108 (90 Base) MCG/ACT inhaler Inhale 2 puffs into the lungs every 4 (four) hours as needed for wheezing or shortness of breath.  Marland Kitchen amLODipine (NORVASC) 10 MG tablet TAKE 1 TABLET(10 MG) BY MOUTH DAILY  . blood glucose meter kit and supplies Dispense based on patient and insurance preference. Check BS BID. (FOR ICD-10 E11.9).  . clotrimazole-betamethasone (LOTRISONE) cream Apply 1 application topically 2 (two) times daily.  Hillary Bow SENSOREADY 300 DOSE 150 MG/ML SOAJ   . doxazosin (CARDURA) 2 MG tablet TAKE 1 TABLET(2 MG) BY MOUTH DAILY  . fexofenadine (ALLEGRA) 180 MG tablet Take 1 tablet (180 mg total) by mouth daily.   Marland Kitchen guaiFENesin-codeine 100-10 MG/5ML syrup Take 5 mLs by mouth every 6 (six) hours as needed.  . metFORMIN (GLUCOPHAGE) 1000 MG tablet Take 1 tablet (1,000 mg total) by mouth 2 (two) times daily with a meal.  . metoprolol succinate (TOPROL-XL) 25 MG 24 hr tablet Take 1 tablet (25 mg total) by mouth daily.  . montelukast (SINGULAIR) 10 MG tablet TAKE 1 TABLET(10 MG) BY MOUTH AT BEDTIME  . pantoprazole (PROTONIX) 40 MG tablet Take 1 tablet (40 mg total) by mouth daily.  Marland Kitchen venlafaxine XR (EFFEXOR-XR) 150 MG 24 hr capsule TAKE 1 CAPSULE BY MOUTH EVERY DAY   No current facility-administered medications for this visit. (Other)      REVIEW OF SYSTEMS:    ALLERGIES Allergies  Allergen Reactions  . Erythromycin Other (See Comments)    Gi upset/ cramps  . Hctz [Hydrochlorothiazide]     CAUSED GOUT  . Macrolides And Ketolides Other (See Comments)    unknown    PAST MEDICAL HISTORY Past Medical History:  Diagnosis Date  . Asthma   . Cold and cough --- no fever  . Complication of anesthesia    24 yrs ago post tubal-- pulm. edema  (no problems post bowel surg. 2009)  . Depression   . Diabetes mellitus    oral med  . Diabetes mellitus without complication ([place])    Phreesia 11/28/2020  . Diverticulitis hx  --  w/ perf. bowel   s/p resection 2009  . Hypertension   . Psoriasis   . Reflux occasional   watches diet   Past Surgical History:  Procedure Laterality Date  . BOWEL RESECTION  2009   diverticulitis w/ perf. bowel  . COLON SURGERY N/A    Phreesia 11/28/2020  . HYSTEROSCOPY  10/09/2011   Procedure: HYSTEROSCOPY;  Surgeon: Margarette Asal;  Location: [facility];  Service: Gynecology;;  HYSTEROSCOPY D & C  . TUBAL LIGATION  24 yrs ago    FAMILY HISTORY History reviewed. No pertinent family history.  SOCIAL HISTORY Social History   Tobacco Use  . Smoking status: Never Smoker  . Smokeless tobacco: Never Used  Substance Use Topics  . Alcohol use: No   . Drug use: No         OPHTHALMIC EXAM: Base Eye Exam    Visual Acuity (ETDRS)      Right Left   Dist [place] 20/40 20/30 -1   Dist ph [place] NI 20/30 +2       Tonometry (Tonopen, 9:12 AM)      Right Left   Pressure 10 14       Pupils      Pupils Dark Light Shape React APD   Right PERRL 4 3 Round Brisk None   Left PERRL 4 3 Round Brisk None       Visual Fields (Counting fingers)      Left Right    Full Full       Extraocular Movement      Right Left    Full Full       Neuro/Psych    Oriented x3: Yes   Mood/Affect: Normal       Dilation    Right eye: 1.0% Mydriacyl, 2.5% Phenylephrine @ 9:12 AM        Slit Lamp and Fundus Exam    External Exam      Right Left   External Normal Normal       Slit Lamp Exam      Right Left   Lids/Lashes Normal Normal   Conjunctiva/Sclera White and quiet White and quiet   Cornea Clear Clear   Anterior Chamber Deep and quiet Deep and quiet   Iris Round and reactive Round and reactive   Lens Centered posterior chamber intraocular lens Centered posterior chamber intraocular lens   Anterior Vitreous Normal Normal       Fundus Exam      Right Left   Posterior Vitreous Normal, clear and avitric    Disc Normal    C/D Ratio 0.45    Macula No topographic distortion, post ILM removal changes    Vessels Normal    Periphery Normal           IMAGING AND PROCEDURES  Imaging and Procedures for 03/12/21  OCT, Retina - OU - Both Eyes       Right Eye Quality was good. Scan locations included subfoveal. Central Foveal Thickness: 389. Progression has improved.   Left Eye Quality was good. Scan locations included subfoveal. Central Foveal Thickness: 299. Progression has been stable. Findings include normal foveal contour.   Notes OD, improving macular thickness only 5 days post vitrectomy membrane peel for ERM  OS normal                ASSESSMENT/PLAN:  Macular pucker, right eye 5 days post vitrectomy membrane  peel right eye.  Patient on topical medications, will  complete these medications or 3 weeks whichever comes first without refills.      ICD-10-CM   1. Macular pucker, right eye  H35.371 OCT, Retina - OU - Both Eyes    1.  5 days postvitrectomy membrane peel, looks great.  We will complete topical medications at the end of 3 weeks from the date of surgery.  2.  Patient released to full activity at 10 days with the exception of swimming or other types of community water until topical medications are complete.  Patient cannot mash or rub the eye or take up  'boxing  "  for her life  3.  Ophthalmic Meds Ordered this visit:  No orders of the defined types were placed in this encounter.      Return in about 8 weeks (around 05/07/2021) for dilate, OD, OCT.  There are no Patient Instructions on file for this visit.   Explained the diagnoses, plan, and follow up with the patient and they expressed understanding.  Patient expressed understanding of the importance of proper follow up care.   Clent Demark Jeweliana Dudgeon M.D. Diseases & Surgery of the Retina and Vitreous Retina [facility] Diabetic [place] 03/12/21     Abbreviations: M myopia (nearsighted); A astigmatism; H hyperopia (farsighted); P presbyopia; Mrx spectacle prescription;  CTL contact lenses; OD right eye; OS left eye; OU both eyes  XT exotropia; ET esotropia; PEK punctate epithelial keratitis; PEE punctate epithelial erosions; DES dry eye syndrome; MGD meibomian gland dysfunction; ATs artificial tears; PFAT's preservative free artificial tears; [place] nuclear sclerotic cataract; PSC posterior subcapsular cataract; ERM epi-retinal membrane; PVD posterior vitreous detachment; RD retinal detachment; DM diabetes mellitus; DR diabetic retinopathy; NPDR non-proliferative diabetic retinopathy; PDR proliferative diabetic retinopathy; CSME clinically significant macular edema; DME diabetic macular edema; dbh dot blot hemorrhages; CWS cotton wool spot; POAG  primary open angle glaucoma; C/D cup-to-disc ratio; HVF humphrey visual field; GVF goldmann visual field; OCT optical coherence tomography; IOP intraocular pressure; BRVO Branch retinal vein occlusion; CRVO central retinal vein occlusion; CRAO central retinal artery occlusion; BRAO branch retinal artery occlusion; RT retinal tear; SB scleral buckle; PPV pars plana vitrectomy; VH Vitreous hemorrhage; PRP panretinal laser photocoagulation; IVK intravitreal kenalog; VMT vitreomacular traction; MH Macular hole;  NVD neovascularization of the disc; NVE neovascularization elsewhere; AREDS age related eye disease study; ARMD age related macular degeneration; POAG primary open angle glaucoma; EBMD epithelial/anterior basement membrane dystrophy; ACIOL anterior chamber intraocular lens; IOL intraocular lens; PCIOL posterior chamber intraocular lens; Phaco/IOL phacoemulsification with intraocular lens placement; [place] photorefractive keratectomy; LASIK laser assisted in situ keratomileusis; HTN hypertension; DM diabetes mellitus; COPD chronic obstructive pulmonary disease

## 2021-03-16 DIAGNOSIS — I10 Essential (primary) hypertension: Secondary | ICD-10-CM | POA: Diagnosis not present

## 2021-03-16 DIAGNOSIS — G4733 Obstructive sleep apnea (adult) (pediatric): Secondary | ICD-10-CM | POA: Diagnosis not present

## 2021-03-16 DIAGNOSIS — R5383 Other fatigue: Secondary | ICD-10-CM | POA: Diagnosis not present

## 2021-03-19 ENCOUNTER — Institutional Professional Consult (permissible substitution): Payer: BC Managed Care – PPO | Admitting: Neurology

## 2021-03-22 ENCOUNTER — Telehealth: Payer: Self-pay | Admitting: Family Medicine

## 2021-03-22 ENCOUNTER — Observation Stay (HOSPITAL_COMMUNITY): Payer: BC Managed Care – PPO

## 2021-03-22 ENCOUNTER — Emergency Department (HOSPITAL_COMMUNITY): Payer: BC Managed Care – PPO

## 2021-03-22 ENCOUNTER — Observation Stay (HOSPITAL_COMMUNITY)
Admission: EM | Admit: 2021-03-22 | Discharge: 2021-03-23 | Disposition: A | Discharge status: Home / Self Care | Payer: BC Managed Care – PPO | Attending: Internal Medicine | Admitting: Internal Medicine

## 2021-03-22 ENCOUNTER — Other Ambulatory Visit: Payer: Self-pay

## 2021-03-22 ENCOUNTER — Encounter (HOSPITAL_COMMUNITY): Payer: Self-pay | Admitting: Internal Medicine

## 2021-03-22 ENCOUNTER — Encounter: Payer: Self-pay | Admitting: *Deleted

## 2021-03-22 DIAGNOSIS — E1159 Type 2 diabetes mellitus with other circulatory complications: Secondary | ICD-10-CM | POA: Diagnosis present

## 2021-03-22 DIAGNOSIS — J45909 Unspecified asthma, uncomplicated: Secondary | ICD-10-CM | POA: Diagnosis not present

## 2021-03-22 DIAGNOSIS — I639 Cerebral infarction, unspecified: Secondary | ICD-10-CM | POA: Diagnosis not present

## 2021-03-22 DIAGNOSIS — Z7984 Long term (current) use of oral hypoglycemic drugs: Secondary | ICD-10-CM | POA: Diagnosis not present

## 2021-03-22 DIAGNOSIS — Z20822 Contact with and (suspected) exposure to covid-19: Secondary | ICD-10-CM | POA: Diagnosis not present

## 2021-03-22 DIAGNOSIS — I152 Hypertension secondary to endocrine disorders: Secondary | ICD-10-CM | POA: Diagnosis present

## 2021-03-22 DIAGNOSIS — R200 Anesthesia of skin: Secondary | ICD-10-CM | POA: Diagnosis not present

## 2021-03-22 DIAGNOSIS — R29810 Facial weakness: Secondary | ICD-10-CM | POA: Diagnosis not present

## 2021-03-22 DIAGNOSIS — I6330 Cerebral infarction due to thrombosis of unspecified cerebral artery: Secondary | ICD-10-CM | POA: Insufficient documentation

## 2021-03-22 DIAGNOSIS — E119 Type 2 diabetes mellitus without complications: Secondary | ICD-10-CM

## 2021-03-22 DIAGNOSIS — I10 Essential (primary) hypertension: Secondary | ICD-10-CM | POA: Diagnosis not present

## 2021-03-22 DIAGNOSIS — Z79899 Other long term (current) drug therapy: Secondary | ICD-10-CM | POA: Diagnosis not present

## 2021-03-22 DIAGNOSIS — R29818 Other symptoms and signs involving the nervous system: Secondary | ICD-10-CM | POA: Diagnosis not present

## 2021-03-22 DIAGNOSIS — I6340 Cerebral infarction due to embolism of unspecified cerebral artery: Secondary | ICD-10-CM | POA: Insufficient documentation

## 2021-03-22 DIAGNOSIS — G459 Transient cerebral ischemic attack, unspecified: Secondary | ICD-10-CM

## 2021-03-22 DIAGNOSIS — I6789 Other cerebrovascular disease: Secondary | ICD-10-CM | POA: Diagnosis not present

## 2021-03-22 LAB — HEMOGLOBIN A1C
Hgb A1c MFr Bld: 6.7 % — ABNORMAL HIGH (ref 4.8–5.6)
Mean Plasma Glucose: 145.59 mg/dL

## 2021-03-22 LAB — LIPID PANEL
Cholesterol: 160 mg/dL (ref 0–200)
HDL: 41 mg/dL (ref >40)
LDL Cholesterol: 101 mg/dL — ABNORMAL HIGH (ref 0–99)
Total CHOL/HDL Ratio: 3.9 RATIO
Triglycerides: 92 mg/dL (ref <150)
VLDL: 18 mg/dL (ref 0–40)

## 2021-03-22 LAB — CBC WITH DIFFERENTIAL/PLATELET
Abs Immature Granulocytes: 0.04 10*3/uL (ref 0.00–0.07)
Basophils Absolute: 0.1 10*3/uL (ref 0.0–0.1)
Basophils Relative: 1 %
Eosinophils Absolute: 0.2 10*3/uL (ref 0.0–0.5)
Eosinophils Relative: 3 %
HCT: 41 % (ref 36.0–46.0)
Hemoglobin: 13.1 g/dL (ref 12.0–15.0)
Immature Granulocytes: 1 %
Lymphocytes Relative: 22 %
Lymphs Abs: 1.8 10*3/uL (ref 0.7–4.0)
MCH: 29.3 pg (ref 26.0–34.0)
MCHC: 32 g/dL (ref 30.0–36.0)
MCV: 91.7 fL (ref 80.0–100.0)
Monocytes Absolute: 0.6 10*3/uL (ref 0.1–1.0)
Monocytes Relative: 7 %
Neutro Abs: 5.5 10*3/uL (ref 1.7–7.7)
Neutrophils Relative %: 66 %
Platelets: 369 10*3/uL (ref 150–400)
RBC: 4.47 MIL/uL (ref 3.87–5.11)
RDW: 12.1 % (ref 11.5–15.5)
WBC: 8.2 10*3/uL (ref 4.0–10.5)
nRBC: 0 % (ref 0.0–0.2)

## 2021-03-22 LAB — BASIC METABOLIC PANEL
Anion gap: 8 (ref 5–15)
BUN: 17 mg/dL (ref 8–23)
CO2: 26 mmol/L (ref 22–32)
Calcium: 9.1 mg/dL (ref 8.9–10.3)
Chloride: 102 mmol/L (ref 98–111)
Creatinine, Ser: 1.06 mg/dL — ABNORMAL HIGH (ref 0.44–1.00)
GFR, Estimated: 59 mL/min — ABNORMAL LOW (ref >60)
Glucose, Bld: 152 mg/dL — ABNORMAL HIGH (ref 70–99)
Potassium: 4.7 mmol/L (ref 3.5–5.1)
Sodium: 136 mmol/L (ref 135–145)

## 2021-03-22 LAB — RESP PANEL BY RT-PCR (FLU A&B, COVID) ARPGX2
Influenza A by PCR: NEGATIVE
Influenza B by PCR: NEGATIVE
SARS Coronavirus 2 by RT PCR: NEGATIVE

## 2021-03-22 LAB — GLUCOSE, CAPILLARY: Glucose-Capillary: 124 mg/dL — ABNORMAL HIGH (ref 70–99)

## 2021-03-22 LAB — PROTIME-INR
INR: 1 (ref 0.8–1.2)
Prothrombin Time: 12.7 seconds (ref 11.4–15.2)

## 2021-03-22 LAB — CBG MONITORING, ED: Glucose-Capillary: 150 mg/dL — ABNORMAL HIGH (ref 70–99)

## 2021-03-22 MED ORDER — VENLAFAXINE HCL ER 75 MG PO CP24
150.0000 mg | ORAL_CAPSULE | Freq: Every day | ORAL | Status: DC
  Administered 2021-03-23: 150 mg via ORAL
  Filled 2021-03-22: qty 1
  Filled 2021-03-22: qty 2

## 2021-03-22 MED ORDER — INSULIN ASPART 100 UNIT/ML IJ SOLN
0.0000 [IU] | Freq: Three times a day (TID) | INTRAMUSCULAR | Status: DC
  Administered 2021-03-23 (×2): 1 [IU] via SUBCUTANEOUS

## 2021-03-22 MED ORDER — ASPIRIN 300 MG RE SUPP: 300.0000 mg | Freq: Every day | RECTAL | Status: DC

## 2021-03-22 MED ORDER — STROKE: EARLY STAGES OF RECOVERY BOOK
Freq: Once | Status: AC
  Filled 2021-03-22 (×2): qty 1

## 2021-03-22 MED ORDER — ENOXAPARIN SODIUM 40 MG/0.4ML IJ SOSY
40.0000 mg | PREFILLED_SYRINGE | INTRAMUSCULAR | Status: DC
  Administered 2021-03-22: 40 mg via SUBCUTANEOUS
  Filled 2021-03-22: qty 0.4

## 2021-03-22 MED ORDER — ACETAMINOPHEN 160 MG/5ML PO SOLN: 650.0000 mg | ORAL | Status: DC | PRN

## 2021-03-22 MED ORDER — ASPIRIN 81 MG PO CHEW
81.0000 mg | CHEWABLE_TABLET | Freq: Every day | ORAL | Status: DC
  Administered 2021-03-22 – 2021-03-23 (×2): 81 mg via ORAL
  Filled 2021-03-22 (×2): qty 1

## 2021-03-22 MED ORDER — ALBUTEROL SULFATE HFA 108 (90 BASE) MCG/ACT IN AERS
2.0000 | INHALATION_SPRAY | RESPIRATORY_TRACT | Status: DC | PRN
  Filled 2021-03-22: qty 6.7

## 2021-03-22 MED ORDER — SENNOSIDES-DOCUSATE SODIUM 8.6-50 MG PO TABS: 1.0000 | ORAL_TABLET | Freq: Every evening | ORAL | Status: DC | PRN

## 2021-03-22 MED ORDER — MONTELUKAST SODIUM 10 MG PO TABS
10.0000 mg | ORAL_TABLET | Freq: Every day | ORAL | Status: DC
  Administered 2021-03-23: 10 mg via ORAL
  Filled 2021-03-22: qty 1

## 2021-03-22 MED ORDER — ACETAMINOPHEN 650 MG RE SUPP: 650.0000 mg | RECTAL | Status: DC | PRN

## 2021-03-22 MED ORDER — ACETAMINOPHEN 325 MG PO TABS
650.0000 mg | ORAL_TABLET | ORAL | Status: DC | PRN
  Administered 2021-03-23: 650 mg via ORAL
  Filled 2021-03-22: qty 2

## 2021-03-22 MED ORDER — CLOPIDOGREL BISULFATE 75 MG PO TABS
75.0000 mg | ORAL_TABLET | Freq: Every day | ORAL | Status: DC
  Administered 2021-03-22 – 2021-03-23 (×2): 75 mg via ORAL
  Filled 2021-03-22 (×2): qty 1

## 2021-03-22 NOTE — ED Notes (Signed)
Patient to MRI.

## 2021-03-22 NOTE — H&P (Signed)
History and Physical    Shamara Soza [facility] GMW:[id number] DOB: 06/20/1956 DOA: 03/22/2021  PCP: Susy Frizzle, MD  Patient coming from: Home  I have personally briefly reviewed patient's old medical records in [place]  Chief Complaint: Facial droop, hand weakness, slurred speech  HPI: NIRVI BOEHLER is a 65 y.o. female with medical history significant for type 2 diabetes, hypertension, asthma, depression who presents to the ED for evaluation of transient facial droop, hand weakness.  Patient states yesterday (4/27) she had new onset of left hand weakness and left facial droop with numbness at both sites.  Symptoms resolved after about 20 minutes.  She says that she was working on her computer and became confused at what she was looking at on the computer which was unusual for her.  Today around 11:30 AM she developed right hand weakness/numbness, right facial droop/numbness, and apparent slurred speech observed by her husband.  She also noted a headache at her left temple today which occurred around the same time.  She did have a headache yesterday but did not pay attention to the localization.  She has had some nausea without emesis.  She otherwise denies any chest pain, palpitations, dyspnea, cough, abdominal pain, dysuria, or diarrhea.  She denies any tobacco, alcohol, or illicit drug use.  She reports a history of stroke in her father and diabetes in her brother.  ED Course:  Initial vitals showed BP 131/105, pulse 98, RR 18, temp 99.4 F, SPO2 95% on room air.  Labs showed sodium 136, potassium 4.7, bicarb 26, BUN 17, creatinine 1.06, serum glucose 152, WBC 8.2, hemoglobin 13.1, platelets 369,000.  SARS-CoV-2 PCR negative.  Influenza A/B PCR negative.  CT head without contrast negative for acute intracranial process.  EDP consulted neurology who recommended medical admission for TIA work-up.  The hospitalist service was consulted to admit for further evaluation and  management.  Review of Systems: All systems reviewed and are negative except as documented in history of present illness above.   Past Medical History:  Diagnosis Date  . Asthma   . Cold and cough --- no fever  . Complication of anesthesia    24 yrs ago post tubal-- pulm. edema  (no problems post bowel surg. 2009)  . Depression   . Diabetes mellitus    oral med  . Diabetes mellitus without complication ([place])    Phreesia 11/28/2020  . Diverticulitis hx  -- w/ perf. bowel   s/p resection 2009  . Hypertension   . Psoriasis   . Reflux occasional   watches diet    Past Surgical History:  Procedure Laterality Date  . BOWEL RESECTION  2009   diverticulitis w/ perf. bowel  . COLON SURGERY N/A    Phreesia 11/28/2020  . HYSTEROSCOPY  10/09/2011   Procedure: HYSTEROSCOPY;  Surgeon: Margarette Asal;  Location: [place] [facility];  Service: Gynecology;;  HYSTEROSCOPY D & C  . TUBAL LIGATION  24 yrs ago    Social History:  reports that she has never smoked. She has never used smokeless tobacco. She reports that she does not drink alcohol and does not use drugs.  Allergies  Allergen Reactions  . Erythromycin Other (See Comments)    Gi upset/ cramps  . Hctz [Hydrochlorothiazide]     CAUSED GOUT  . Macrolides And Ketolides Other (See Comments)    unknown    Family History  Problem Relation Age of Onset  . Stroke Father   . Diabetes Brother  Prior to Admission medications   Medication Sig Start Date End Date Taking? Authorizing Provider  ACCU-CHEK FASTCLIX LANCETS MISC Tes bid 03/02/18   Susy Frizzle, MD  albuterol (VENTOLIN HFA) 108 (90 Base) MCG/ACT inhaler Inhale 2 puffs into the lungs every 4 (four) hours as needed for wheezing or shortness of breath. 10/31/20   Alycia Rossetti, MD  amLODipine (NORVASC) 10 MG tablet TAKE 1 TABLET(10 MG) BY MOUTH DAILY 06/20/20   Susy Frizzle, MD  blood glucose meter kit and supplies Dispense based on patient and  insurance preference. Check BS BID. (FOR ICD-10 E11.9). 01/19/18   Susy Frizzle, MD  clotrimazole-betamethasone (LOTRISONE) cream Apply 1 application topically 2 (two) times daily. 12/01/20   Susy Frizzle, MD  COSENTYX SENSOREADY 300 DOSE 150 MG/ML SOAJ  08/21/17   [provider]  doxazosin (CARDURA) 2 MG tablet TAKE 1 TABLET(2 MG) BY MOUTH DAILY 07/05/20   Susy Frizzle, MD  fexofenadine (ALLEGRA) 180 MG tablet Take 1 tablet (180 mg total) by mouth daily. 07/05/19   Susy Frizzle, MD  guaiFENesin-codeine 100-10 MG/5ML syrup Take 5 mLs by mouth every 6 (six) hours as needed. 10/31/20   Alycia Rossetti, MD  metFORMIN (GLUCOPHAGE) 1000 MG tablet Take 1 tablet (1,000 mg total) by mouth 2 (two) times daily with a meal. 01/19/20   Susy Frizzle, MD  metoprolol succinate (TOPROL-XL) 25 MG 24 hr tablet Take 1 tablet (25 mg total) by mouth daily. 06/20/20   Susy Frizzle, MD  montelukast (SINGULAIR) 10 MG tablet TAKE 1 TABLET(10 MG) BY MOUTH AT BEDTIME 06/20/20   Susy Frizzle, MD  pantoprazole (PROTONIX) 40 MG tablet Take 1 tablet (40 mg total) by mouth daily. 12/04/20   Susy Frizzle, MD  venlafaxine XR (EFFEXOR-XR) 150 MG 24 hr capsule TAKE 1 CAPSULE BY MOUTH EVERY DAY 06/20/20   Susy Frizzle, MD    Physical Exam: Vitals:   03/22/21 1740 03/22/21 1802 03/22/21 2215 03/22/21 2351  BP: (!) 151/101   (!) 145/49  Pulse: 72  88 86  Resp: 18   18  Temp: 98.8 F (37.1 C)   97.9 F (36.6 C)  TempSrc: Oral   Oral  SpO2: 99%  93% 96%  Weight:  117.9 kg    Height:  5' 7"  (1.702 m)     Constitutional: Resting in bed, NAD, calm, comfortable Eyes: PERRL, lids and conjunctivae normal ENMT: Mucous membranes are moist. Posterior pharynx clear of any exudate or lesions.Normal dentition.  Neck: normal, supple, no masses. Respiratory: clear to auscultation bilaterally, no wheezing, no crackles. Normal respiratory effort. No accessory muscle use.  Cardiovascular: Regular  rate and rhythm, no murmurs / rubs / gallops. No extremity edema. 2+ pedal pulses. Abdomen: no tenderness, no masses palpated. No hepatosplenomegaly. Bowel sounds positive.  Musculoskeletal: no clubbing / cyanosis. No joint deformity upper and lower extremities. Good ROM, no contractures. Normal muscle tone.  Skin: no rashes, lesions, ulcers. No induration Neurologic: CN 2-12 grossly intact. Sensation intact. Strength 5/5 in all 4.  Psychiatric: Normal judgment and insight. Alert and oriented x 3. Normal mood.   Labs on Admission: I have personally reviewed following labs and imaging studies  CBC: Recent Labs  Lab 03/22/21 1600  WBC 8.2  NEUTROABS 5.5  HGB 13.1  HCT 41.0  MCV 91.7  PLT 814   Basic Metabolic Panel: Recent Labs  Lab 03/22/21 1600  NA 136  K 4.7  CL 102  CO2 26  GLUCOSE 152*  BUN 17  CREATININE 1.06*  CALCIUM 9.1   GFR: Estimated Creatinine Clearance: 71.2 mL/min (A) (by C-G formula based on SCr of 1.06 mg/dL (H)). Liver Function Tests: No results for input(s): AST, ALT, ALKPHOS, BILITOT, PROT, ALBUMIN in the last 168 hours. No results for input(s): LIPASE, AMYLASE in the last 168 hours. No results for input(s): AMMONIA in the last 168 hours. Coagulation Profile: Recent Labs  Lab 03/22/21 1600  INR 1.0   Cardiac Enzymes: No results for input(s): CKTOTAL, CKMB, CKMBINDEX, TROPONINI in the last 168 hours. BNP (last 3 results) No results for input(s): PROBNP in the last 8760 hours. HbA1C: Recent Labs    03/22/21 1620  HGBA1C 6.7*   CBG: Recent Labs  Lab 03/22/21 1557 03/22/21 2352  GLUCAP 150* 124*   Lipid Profile: Recent Labs    03/22/21 1943  CHOL 160  HDL 41  LDLCALC 101*  TRIG 92  CHOLHDL 3.9   Thyroid Function Tests: No results for input(s): TSH, T4TOTAL, FREET4, T3FREE, THYROIDAB in the last 72 hours. Anemia Panel: No results for input(s): VITAMINB12, FOLATE, FERRITIN, TIBC, IRON, RETICCTPCT in the last 72 hours. Urine  analysis:    Component Value Date/Time   COLORURINE YELLOW 01/01/2019 0848   APPEARANCEUR CLOUDY (A) 01/01/2019 0848   LABSPEC 1.025 01/01/2019 0848   PHURINE 6.0 01/01/2019 0848   GLUCOSEU NEGATIVE 01/01/2019 0848   HGBUR TRACE (A) 01/01/2019 0848   BILIRUBINUR NEGATIVE 12/15/2018 1923   KETONESUR TRACE (A) 01/01/2019 0848   PROTEINUR 1+ (A) 01/01/2019 0848   UROBILINOGEN 0.2 12/15/2018 1923   UROBILINOGEN 1 10/29/2013 1112   NITRITE NEGATIVE 01/01/2019 0848   LEUKOCYTESUR 2+ (A) 01/01/2019 0848    Radiological Exams on Admission: CT Head Wo Contrast  Result Date: 03/22/2021 CLINICAL DATA:  Numbness and facial droop on the left side yesterday and on the right side today. EXAM: CT HEAD WITHOUT CONTRAST TECHNIQUE: Contiguous axial images were obtained from the base of the skull through the vertex without intravenous contrast. COMPARISON:  CT paranasal sinuses dated 12/08/2003. FINDINGS: Brain: No evidence of acute infarction, hemorrhage, hydrocephalus, extra-axial collection or mass lesion/mass effect. Periventricular white matter hypoattenuation likely represents chronic small vessel ischemic disease. Vascular: There are vascular calcifications in the carotid siphons. Skull: Normal. Negative for fracture or focal lesion. Sinuses/Orbits: No acute finding. Other: None. IMPRESSION: No acute intracranial process. Electronically Signed   By: Zerita Boers M.D.   On: 03/22/2021 16:40   MR ANGIO HEAD WO CONTRAST  Result Date: 03/22/2021 CLINICAL DATA:  Initial evaluation for neuro deficit, stroke suspected, left-sided facial droop and numbness. EXAM: MRI HEAD WITHOUT CONTRAST MRA HEAD WITHOUT CONTRAST TECHNIQUE: Multiplanar, multiecho pulse sequences of the brain and surrounding structures were obtained without intravenous contrast. Angiographic images of the head were obtained using MRA technique without contrast. COMPARISON:  Prior head CT from earlier the same day. FINDINGS: MRI HEAD FINDINGS  Brain: Cerebral volume within normal limits for age. Mild scattered patchy T2/FLAIR hyperintensity involving the periventricular and deep white matter both cerebral hemispheres as well as the pons, most consistent with chronic small vessel ischemic disease, mild to moderate in nature at the pons. Superimposed remote lacunar infarct present at the deep white matter of the left frontal centrum semi ovale. Few tiny remote bilateral cerebellar infarcts noted. No abnormal foci of restricted diffusion to suggest acute or subacute ischemia. Gray-white matter differentiation maintained. No encephalomalacia to suggest chronic cortical infarction. No foci of susceptibility artifact to  suggest acute or chronic intracranial hemorrhage. No mass lesion, midline shift or mass effect. No hydrocephalus or extra-axial fluid collection. Pituitary gland suprasellar region within normal limits. Midline structures intact. Vascular: Major intracranial vascular flow voids are maintained. Skull and upper cervical spine: Craniocervical junction within normal limits. Bone marrow signal intensity normal. No focal marrow replacing lesion. No scalp soft tissue abnormality. Sinuses/Orbits: Patient status post bilateral ocular lens replacement. Globes and orbital soft tissues demonstrate no acute finding. Paranasal sinuses are largely clear. No significant mastoid effusion. Inner ear structures grossly normal. Other: None. MRA HEAD FINDINGS Examination mildly degraded by motion artifact. ANTERIOR CIRCULATION: Visualized distal cervical segments of the internal carotid arteries are patent with antegrade flow. Distal cervical right ICA tortuous. Petrous, cavernous, and supraclinoid segments patent without appreciable stenosis or other abnormality. A1 segments patent. Normal anterior communicating artery complex. Anterior cerebral arteries patent to their distal aspects without stenosis. No M1 stenosis or occlusion. Normal MCA bifurcations. Distal  MCA branches perfused and symmetric. Suspected diffuse small vessel atheromatous irregularity. POSTERIOR CIRCULATION: Right vertebral artery strongly dominant and widely patent to the vertebrobasilar junction. Left vertebral artery hypoplastic and patent as well. Both PICA origins patent and normal. Basilar mildly irregular but patent to its distal aspect without stenosis. Superior cerebellar arteries patent bilaterally. Both PCAs primarily supplied via the basilar. PCAs mildly irregular but widely patent proximally, not well evaluated distally due to motion. Question severe stenosis at the distal left P2/P3 junction (series 1027, image 15). No intracranial aneurysm. IMPRESSION: MRI HEAD IMPRESSION: 1. No acute intracranial infarct or other abnormality. 2. Mild to moderate chronic microvascular ischemic disease, with a few scattered remote lacunar infarcts involving the deep white matter of the left frontal centrum semi ovale and cerebellum. MRA HEAD IMPRESSION: 1. Motion degraded exam. 2. Negative intracranial MRA for large vessel occlusion. 3. Question severe distal left P2/P3 junction stenosis. No other hemodynamically significant or correctable stenosis. 4. Suspected diffuse small vessel atheromatous irregularity. Electronically Signed   By: Jeannine Boga M.D.   On: 03/22/2021 23:40   MR BRAIN WO CONTRAST  Result Date: 03/22/2021 CLINICAL DATA:  Initial evaluation for neuro deficit, stroke suspected, left-sided facial droop and numbness. EXAM: MRI HEAD WITHOUT CONTRAST MRA HEAD WITHOUT CONTRAST TECHNIQUE: Multiplanar, multiecho pulse sequences of the brain and surrounding structures were obtained without intravenous contrast. Angiographic images of the head were obtained using MRA technique without contrast. COMPARISON:  Prior head CT from earlier the same day. FINDINGS: MRI HEAD FINDINGS Brain: Cerebral volume within normal limits for age. Mild scattered patchy T2/FLAIR hyperintensity involving the  periventricular and deep white matter both cerebral hemispheres as well as the pons, most consistent with chronic small vessel ischemic disease, mild to moderate in nature at the pons. Superimposed remote lacunar infarct present at the deep white matter of the left frontal centrum semi ovale. Few tiny remote bilateral cerebellar infarcts noted. No abnormal foci of restricted diffusion to suggest acute or subacute ischemia. Gray-white matter differentiation maintained. No encephalomalacia to suggest chronic cortical infarction. No foci of susceptibility artifact to suggest acute or chronic intracranial hemorrhage. No mass lesion, midline shift or mass effect. No hydrocephalus or extra-axial fluid collection. Pituitary gland suprasellar region within normal limits. Midline structures intact. Vascular: Major intracranial vascular flow voids are maintained. Skull and upper cervical spine: Craniocervical junction within normal limits. Bone marrow signal intensity normal. No focal marrow replacing lesion. No scalp soft tissue abnormality. Sinuses/Orbits: Patient status post bilateral ocular lens replacement. Globes and orbital soft tissues  demonstrate no acute finding. Paranasal sinuses are largely clear. No significant mastoid effusion. Inner ear structures grossly normal. Other: None. MRA HEAD FINDINGS Examination mildly degraded by motion artifact. ANTERIOR CIRCULATION: Visualized distal cervical segments of the internal carotid arteries are patent with antegrade flow. Distal cervical right ICA tortuous. Petrous, cavernous, and supraclinoid segments patent without appreciable stenosis or other abnormality. A1 segments patent. Normal anterior communicating artery complex. Anterior cerebral arteries patent to their distal aspects without stenosis. No M1 stenosis or occlusion. Normal MCA bifurcations. Distal MCA branches perfused and symmetric. Suspected diffuse small vessel atheromatous irregularity. POSTERIOR  CIRCULATION: Right vertebral artery strongly dominant and widely patent to the vertebrobasilar junction. Left vertebral artery hypoplastic and patent as well. Both PICA origins patent and normal. Basilar mildly irregular but patent to its distal aspect without stenosis. Superior cerebellar arteries patent bilaterally. Both PCAs primarily supplied via the basilar. PCAs mildly irregular but widely patent proximally, not well evaluated distally due to motion. Question severe stenosis at the distal left P2/P3 junction (series 1027, image 15). No intracranial aneurysm. IMPRESSION: MRI HEAD IMPRESSION: 1. No acute intracranial infarct or other abnormality. 2. Mild to moderate chronic microvascular ischemic disease, with a few scattered remote lacunar infarcts involving the deep white matter of the left frontal centrum semi ovale and cerebellum. MRA HEAD IMPRESSION: 1. Motion degraded exam. 2. Negative intracranial MRA for large vessel occlusion. 3. Question severe distal left P2/P3 junction stenosis. No other hemodynamically significant or correctable stenosis. 4. Suspected diffuse small vessel atheromatous irregularity. Electronically Signed   By: Jeannine Boga M.D.   On: 03/22/2021 23:40    EKG: Personally reviewed. Sinus rhythm without acute ischemic changes.  Motion artifact present.  Not significantly changed when compared to prior.  Assessment/Plan Principal Problem:   Left facial numbness Active Problems:   Asthma   Hypertension associated with diabetes (HCC)   Cerebral thrombosis with cerebral infarction   Cerebral embolism with cerebral infarction   Type 2 diabetes mellitus ([place])   DARCUS EDDS is a 65 y.o. female with medical history significant for type 2 diabetes, hypertension, asthma, depression who is admitted for evaluation of transient facial droop and hand weakness.  Transient facial droop and hand weakness: Patient reports initially left hand weakness/numbness with left facial  droop day prior to admission then similar symptoms on the right side on day of admission with slurred speech.  Both episodes resolved after 20 minutes.  Has had associated headache.  Neurology recommending admission for TIA work-up.  Complex migraine also on differential. -Neurology following, appreciate assistance -MRI brain and MRA head -Carotid Dopplers -Echocardiogram -Started on aspirin 81 mg daily and Plavix 75 mg daily x21 days then aspirin alone per neurology -Hemoglobin A1c, lipid panel -PT/OT/SLP eval -Monitor on telemetry, continue neurochecks -Allow permissive hypertension for now  Type 2 diabetes: A1c 6.7%.  Holding home metformin, placed on sensitive SSI.  Hypertension: Holding home Toprol-XL and amlodipine to allow for permissive hypertension for now.  Asthma: Stable.  Continue Singulair and albuterol as needed.  Depression: Continue Effexor XR.  DVT prophylaxis: Lovenox Code Status: DNR, confirmed with patient Family Communication: Discussed with patient, she has discussed with family Disposition Plan: From home and likely discharge to home pending further CVA work-up Consults called: Neurology Level of care: Telemetry Medical Admission status:  Status is: Observation  The patient remains OBS appropriate and will d/c before 2 midnights.  Dispo: The patient is from: Home  Anticipated d/c is to: Home              Patient currently is not medically stable to d/c.   Difficult to place patient No  Zada Finders MD [facility]  If 7PM-7AM, please contact night-coverage [email or web address]  03/23/2021, 1:20 AM

## 2021-03-22 NOTE — ED Notes (Signed)
Patient recently had cataract removal, wrinkles were found and removed from the back of the eye. Headache started a couple days after procedure and has lasted since.

## 2021-03-22 NOTE — Telephone Encounter (Signed)
Retrieved voicemail from patient's spouse Truman Hayward; patient experiencing symptoms of a mini-stroke and needs to be seen asap. No slots on the schedule. Margreta Journey Six will advise him to take her to the ED or Urgent Care.

## 2021-03-22 NOTE — ED Provider Notes (Signed)
Emergency Medicine Provider Triage Evaluation Note  Caitlyn [name], a 65 y.o. female evaluated in triage.  Pt complains of intermittent facial droop.  States yesterday she noticed facial droop to the left mouth and some numbness to the left arm.  That resolved after 20 minutes.  Occurred again today around 1130.  Right-sided facial droop and right hand numbness occulta gripping.  This lasted 20 minutes and completely resolved.  Endorses about 1 week of headache, coming and going, history of the same  BP (!) 131/105 (BP Location: Right Arm)   Pulse 98   Temp 99.4 F (37.4 C) (Oral)   Resp 18   SpO2 95%   Patient is alert, no acute distress, normal work of breathing No cranial nerve deficits. Normal finger-nose bilaterally, equal strength and sensation to all 4 extremities.  Speech is fluent without aphasia    Medically screening exam initiated at 3:50 PM. Appropriate orders placed.  [facility] was informed that the remainder of the evaluation will be completed by another provider, this initial triage assessment does not replace that evaluation, and the importance of remaining in the ED until their evaluation is complete.       Kekai Geter, [place] N, PA-C 03/22/21 1553    Carmin Muskrat, MD 03/30/21 1616

## 2021-03-22 NOTE — ED Provider Notes (Signed)
[facility] EMERGENCY DEPARTMENT Provider Note   CSN: [id number] Arrival date & time: 03/22/21  1524     History Chief Complaint  Patient presents with  . Numbness    Caitlyn [name] is a 65 y.o. female with past medical history significant for type II DM, HTN, and anxiety presents the ED with complaints of intermittent left-sided facial droop and numbness.  I reviewed patient's medical record and on 03/07/2021 she had vitrectomy membrane peel with her ophthalmologist for macular pucker.  She then called her primary care provider about today symptoms who expressed concern for mini stroke and advised her to come to the ED for evaluation.  On my examination, patient tells me that for the past week and a half she has been experiencing intermittent headaches.  She states that she has a history of migraine disorder, but has not had any migraine headaches since changing jobs a few years ago.  He does state that she has been having increased anxiety and stress recently related to her 81 year old daughter.  Yesterday she developed sudden onset numbness on the left side of her face with notable facial droop that was recognized by her husband.  She also felt as though her left arm had gone weak and she was unable to hold onto a pen.  She had evidently been staring at her computer and "blanked out".  She states that this lasted approximately 20 minutes before resolving spontaneously.  Today it happened again.  She states that she developed right-sided numbness and when she touched her face her sensation was diminished.  Patient also noted right arm weakness again.  No lower extremity weakness.  She states that her symptoms again lasted approximately 20 minutes before resolving.  This episode is what prompted her husband to call her primary care provider who advised her to come to the ED.  She states that with each episode she was having headache symptoms that preceded the neurologic  deficits.  No history of complex migraine.  She denies any recent illness or infection, fevers or chills, chest pain, shortness of breath, abdominal pain, diaphoresis, vomiting, or any other symptoms.  HPI     Past Medical History:  Diagnosis Date  . Asthma   . Cold and cough --- no fever  . Complication of anesthesia    24 yrs ago post tubal-- pulm. edema  (no problems post bowel surg. 2009)  . Depression   . Diabetes mellitus    oral med  . Diabetes mellitus without complication ([place])    Phreesia 11/28/2020  . Diverticulitis hx  -- w/ perf. bowel   s/p resection 2009  . Hypertension   . Psoriasis   . Reflux occasional   watches diet    Patient Active Problem List   Diagnosis Date Noted  . Macular pucker, right eye 02/13/2021  . Crohn disease ([place])   . Psoriasis   . Type II diabetes mellitus, uncontrolled ([place]) 09/27/2016  . Hx of Crohn's disease 09/27/2016  . Asthma 09/27/2016  . HTN (hypertension) 09/27/2016    Past Surgical History:  Procedure Laterality Date  . BOWEL RESECTION  2009   diverticulitis w/ perf. bowel  . COLON SURGERY N/A    Phreesia 11/28/2020  . HYSTEROSCOPY  10/09/2011   Procedure: HYSTEROSCOPY;  Surgeon: Margarette Asal;  Location: [facility];  Service: Gynecology;;  HYSTEROSCOPY D & C  . TUBAL LIGATION  24 yrs ago     OB History   No  obstetric history on file.     No family history on file.  Social History   Tobacco Use  . Smoking status: Never Smoker  . Smokeless tobacco: Never Used  Substance Use Topics  . Alcohol use: No  . Drug use: No    Home Medications Prior to Admission medications   Medication Sig Start Date End Date Taking? Authorizing Provider  ACCU-CHEK FASTCLIX LANCETS MISC Tes bid 03/02/18   Susy Frizzle, MD  albuterol (VENTOLIN HFA) 108 (90 Base) MCG/ACT inhaler Inhale 2 puffs into the lungs every 4 (four) hours as needed for wheezing or shortness of breath. 10/31/20   Alycia Rossetti, MD   amLODipine (NORVASC) 10 MG tablet TAKE 1 TABLET(10 MG) BY MOUTH DAILY 06/20/20   Susy Frizzle, MD  blood glucose meter kit and supplies Dispense based on patient and insurance preference. Check BS BID. (FOR ICD-10 E11.9). 01/19/18   Susy Frizzle, MD  clotrimazole-betamethasone (LOTRISONE) cream Apply 1 application topically 2 (two) times daily. 12/01/20   Susy Frizzle, MD  COSENTYX SENSOREADY 300 DOSE 150 MG/ML SOAJ  08/21/17   [provider]  doxazosin (CARDURA) 2 MG tablet TAKE 1 TABLET(2 MG) BY MOUTH DAILY 07/05/20   Susy Frizzle, MD  fexofenadine (ALLEGRA) 180 MG tablet Take 1 tablet (180 mg total) by mouth daily. 07/05/19   Susy Frizzle, MD  guaiFENesin-codeine 100-10 MG/5ML syrup Take 5 mLs by mouth every 6 (six) hours as needed. 10/31/20   Alycia Rossetti, MD  metFORMIN (GLUCOPHAGE) 1000 MG tablet Take 1 tablet (1,000 mg total) by mouth 2 (two) times daily with a meal. 01/19/20   Susy Frizzle, MD  metoprolol succinate (TOPROL-XL) 25 MG 24 hr tablet Take 1 tablet (25 mg total) by mouth daily. 06/20/20   Susy Frizzle, MD  montelukast (SINGULAIR) 10 MG tablet TAKE 1 TABLET(10 MG) BY MOUTH AT BEDTIME 06/20/20   Susy Frizzle, MD  pantoprazole (PROTONIX) 40 MG tablet Take 1 tablet (40 mg total) by mouth daily. 12/04/20   Susy Frizzle, MD  venlafaxine XR (EFFEXOR-XR) 150 MG 24 hr capsule TAKE 1 CAPSULE BY MOUTH EVERY DAY 06/20/20   Susy Frizzle, MD    Allergies    Erythromycin, Hctz [hydrochlorothiazide], and Macrolides and ketolides  Review of Systems   Review of Systems  All other systems reviewed and are negative.   Physical Exam Updated Vital Signs BP (!) 151/101 (BP Location: Right Arm)   Pulse 72   Temp 98.8 F (37.1 C) (Oral)   Resp 18   Ht _0  (1.702 m)   Wt 117.9 kg   SpO2 99%   BMI 40.72 kg/m   Physical Exam Vitals and nursing note reviewed. Exam conducted with a chaperone present.  Constitutional:      General: She  is not in acute distress.    Appearance: Normal appearance. She is not ill-appearing.  HENT:     Head: Normocephalic and atraumatic.  Eyes:     General: No scleral icterus.    Extraocular Movements: Extraocular movements intact.     Conjunctiva/sclera: Conjunctivae normal.     Pupils: Pupils are equal, round, and reactive to light.  Cardiovascular:     Rate and Rhythm: Normal rate and regular rhythm.     Pulses: Normal pulses.  Pulmonary:     Effort: Pulmonary effort is normal. No respiratory distress.  Musculoskeletal:        General: Normal range of motion.  Cervical back: Normal range of motion. No rigidity.  Skin:    General: Skin is dry.  Neurological:     General: No focal deficit present.     Mental Status: She is alert and oriented to person, place, and time.     GCS: GCS eye subscore is 4. GCS verbal subscore is 5. GCS motor subscore is 6.     Cranial Nerves: No cranial nerve deficit.     Sensory: No sensory deficit.     Motor: No weakness.     Coordination: Coordination normal.     Gait: Gait normal.     Comments: CN II through XII grossly intact.  Alert and oriented x3.  PERRL and EOM intact.  Moves all extremities with strength intact against resistance.  Sensation intact and symmetric throughout.  Facial sensation to light touch intact and symmetric.  Uvula rises symmetrically.  Negative Romberg and cerebellar exams.  No dysarthria.  Normal exam.  Psychiatric:        Mood and Affect: Mood normal.        Behavior: Behavior normal.        Thought Content: Thought content normal.     ED Results / Procedures / Treatments   Labs (all labs ordered are listed, but only abnormal results are displayed) Labs Reviewed  BASIC METABOLIC PANEL - Abnormal; Notable for the following components:      Result Value   Glucose, Bld 152 (*)    Creatinine, Ser 1.06 (*)    GFR, Estimated 59 (*)    All other components within normal limits  CBG MONITORING, ED - Abnormal; Notable  for the following components:   Glucose-Capillary 150 (*)    All other components within normal limits  CBC WITH DIFFERENTIAL/PLATELET  PROTIME-INR    EKG EKG Interpretation  Date/Time:  Thursday March 22 2021 15:35:55 EDT Ventricular Rate:  100 PR Interval:  138 QRS Duration: 78 QT Interval:  330 QTC Calculation: 425 R Axis:   68 Text Interpretation: Normal sinus rhythm Normal ECG No significant change since last tracing Confirmed by Blanchie Dessert [phone]) on 03/22/2021 5:37:42 PM   Radiology CT Head Wo Contrast  Result Date: 03/22/2021 CLINICAL DATA:  Numbness and facial droop on the left side yesterday and on the right side today. EXAM: CT HEAD WITHOUT CONTRAST TECHNIQUE: Contiguous axial images were obtained from the base of the skull through the vertex without intravenous contrast. COMPARISON:  CT paranasal sinuses dated 12/08/2003. FINDINGS: Brain: No evidence of acute infarction, hemorrhage, hydrocephalus, extra-axial collection or mass lesion/mass effect. Periventricular white matter hypoattenuation likely represents chronic small vessel ischemic disease. Vascular: There are vascular calcifications in the carotid siphons. Skull: Normal. Negative for fracture or focal lesion. Sinuses/Orbits: No acute finding. Other: None. IMPRESSION: No acute intracranial process. Electronically Signed   By: Zerita Boers M.D.   On: 03/22/2021 16:40    Procedures Procedures   Medications Ordered in ED Medications - No data to display  ED Course  I have reviewed the triage vital signs and the nursing notes.  Pertinent labs & imaging results that were available during my care of the patient were reviewed by me and considered in my medical decision making (see chart for details).  Clinical Course as of 03/22/21 1856  Thu Mar 22, 2021  3244 I spoke with Dr. Quinn Axe, neurology, who recommends admission to hospitalist for TIA work-up. She will come evaluate patient at bedside and place orders  for TIA work-up. [GG]  Clinical Course User Index [GG] Corena Herter, PA-C   MDM Rules/Calculators/A&P                          Caitlyn [name] [facility] was evaluated in Emergency Department on 03/22/2021 for the symptoms described in the history of present illness. She was evaluated in the context of the global COVID-19 pandemic, which necessitated consideration that the patient might be at risk for infection with the SARS-CoV-2 virus that causes COVID-19. Institutional protocols and algorithms that pertain to the evaluation of patients at risk for COVID-19 are in a state of rapid change based on information released by regulatory bodies including the CDC and federal and state organizations. These policies and algorithms were followed during the patient's care in the ED.  I personally reviewed patient's medical chart and all notes from triage and staff during today's encounter. I have also ordered and reviewed all labs and imaging that I felt to be medically necessary in the evaluation of this patient's complaints and with consideration of their physical exam. If needed, translation services were available and utilized.   Patient's neurologic exam here in the ED is entirely benign.  Her history however is concerning for TIA versus complex migraine.  ABCD2 score is moderate risk at 5 points, 90-day stroke risk 9.8%.  Recommends consideration of further imaging modalities including MRI and carotid ultrasound as well as consult to neurology.    I will consult with neurology.  Suspect that she should be admitted for TIA/stroke work-up given that she is moderate risk.  I spoke with Dr. Quinn Axe, neurology, who recommends admission to hospitalist for TIA work-up. She will come evaluate patient at bedside and place orders for TIA work-up.  Martinique Robinson PA-C will consult hospitalist on my behalf given shift change.    Final Clinical Impression(s) / ED Diagnoses Final diagnoses:  TIA (transient ischemic  attack)    Rx / DC Orders ED Discharge Orders    None       Corena Herter, PA-C 03/22/21 1856    Blanchie Dessert, MD 03/26/21 [phone]

## 2021-03-22 NOTE — Progress Notes (Incomplete)
Pt arrived to 3W.Marland Kitchen

## 2021-03-22 NOTE — Consult Note (Signed)
NEUROLOGY CONSULTATION NOTE   Date of service: March 22, 2021 Patient Name: Caitlyn [name] MRN:  [id number] DOB:  27-Sep-1956 Reason for consult: "intermittent facial droop" _ _ _   _ __   _ __ _ _  __ __   _ __   __ _  History of Present Illness  Caitlyn [name] is a 65 y.o. female with PMH significant for depression, DM2, HTN, GERD who presents with 10 day history of headaches and episodes of intermittent facial droop and arm weakness.  She reports she had headache for the last 10 days that started after she had cataract removal by her ophthalmologist.  The headache is intermittent and jumps from left to right temple and sometimes frontal.  Describes it as sharp pain and goes away with Advil.  Would last 2 days she is had 2 episodes of facial droop and arm weakness.  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  Today [4/26 8/22], she had another episode but this time with right facial droop and right arm weakness and she could not grip things with her right hand.  She was sitting in front of a computer and felt confused and could not make sense out of what was in front of her.  This episode also lasted about 20 minutes and then went away.  She does not take aspirin at home, no prior history of strokes.  Symptoms completely resolved and she currently has no aphasia, no facial droop, no arm or leg weakness or numbness, no dysarthria.  Reports she had trouble with vision in her right eye since February 2022 for which she is following with an ophthalmologist.  Does not smoke, no EtOH, no recreational substances.    ROS   Constitutional Denies weight loss, fever and chills.   HEENT Denies changes in vision and hearing.   Respiratory Denies SOB and cough.   CV Denies palpitations and CP   GI Denies abdominal pain, nausea, vomiting and diarrhea.   GU Denies dysuria  and urinary frequency.   MSK Denies myalgia and joint pain.   Skin Denies rash and pruritus.   Neurological Endorses headache but no syncope.   Psychiatric Denies recent changes in mood. Denies anxiety and depression.   Past History   Past Medical History:  Diagnosis Date  . Asthma   . Cold and cough --- no fever  . Complication of anesthesia    24 yrs ago post tubal-- pulm. edema  (no problems post bowel surg. 2009)  . Depression   . Diabetes mellitus    oral med  . Diabetes mellitus without complication ([place])    Phreesia 11/28/2020  . Diverticulitis hx  -- w/ perf. bowel   s/p resection 2009  . Hypertension   . Psoriasis   . Reflux occasional   watches diet   Past Surgical History:  Procedure Laterality Date  . BOWEL RESECTION  2009   diverticulitis w/ perf. bowel  . COLON SURGERY N/A    Phreesia 11/28/2020  . HYSTEROSCOPY  10/09/2011   Procedure: HYSTEROSCOPY;  Surgeon: Margarette Asal;  Location: [facility];  Service: Gynecology;;  HYSTEROSCOPY D & C  . TUBAL LIGATION  24 yrs ago   History reviewed. No pertinent family history. Social History   Socioeconomic History  . Marital status: Married  Spouse name: Not on file  . Number of children: Not on file  . Years of education: Not on file  . Highest education level: Not on file  Occupational History  . Occupation: Optometrist  Tobacco Use  . Smoking status: Never Smoker  . Smokeless tobacco: Never Used  Substance and Sexual Activity  . Alcohol use: No  . Drug use: No  . Sexual activity: Not on file  Other Topics Concern  . Not on file  Social History Narrative   Lives with her husband   Social Determinants of Health   Financial Resource Strain: Not on file  Food Insecurity: Not on file  Transportation Needs: Not on file  Physical Activity: Not on file  Stress: Not on file  Social Connections: Not on file   Allergies  Allergen Reactions  . Erythromycin Other (See Comments)     Gi upset/ cramps  . Hctz [Hydrochlorothiazide]     CAUSED GOUT  . Macrolides And Ketolides Other (See Comments)    unknown    Medications  (Not in a hospital admission)    Vitals   Vitals:   03/22/21 1537 03/22/21 1726 03/22/21 1740 03/22/21 1802  BP: (!) 131/105 (!) 183/104 (!) 151/101   Pulse: 98 99 72   Resp: 18 (!) 21 18   Temp: 99.4 F (37.4 C)  98.8 F (37.1 C)   TempSrc: Oral  Oral   SpO2: 95% 94% 99%   Weight:    117.9 kg  Height:    5' 7"  (1.702 m)     Body mass index is 40.72 kg/m.  Physical Exam   General: Laying comfortably in bed; in no acute distress.  HENT: Normal oropharynx and mucosa. Normal external appearance of ears and nose.  Neck: Supple, no pain or tenderness  CV: No JVD. No peripheral edema.  Pulmonary: Symmetric Chest rise. Normal respiratory effort.  Abdomen: Soft to touch, non-tender.  Ext: No cyanosis, edema, or deformity  Skin: No rash. Normal palpation of skin.   Musculoskeletal: Normal digits and nails by inspection. No clubbing.   Neurologic Examination  Mental status/Cognition: Alert, oriented to self, place, month and year, good attention.  Speech/language: Fluent, comprehension intact, object naming intact, repetition intact.  Cranial nerves:   CN II Pupils equal and reactive to light, no VF deficits    CN III,IV,VI EOM intact, no gaze preference or deviation, no nystagmus    CN V normal sensation in V1, V2, and V3 segments bilaterally    CN VII no asymmetry, no nasolabial fold flattening    CN VIII normal hearing to speech    CN IX & X normal palatal elevation, no uvular deviation    CN XI 5/5 head turn and 5/5 shoulder shrug bilaterally    CN XII midline tongue protrusion    Motor:  Muscle bulk: normal, tone normal, pronator drift none tremor none Mvmt Root Nerve  Muscle Right Left Comments  SA C5/6 Ax Deltoid 5 5   EF C5/6 Mc Biceps 5 5   EE C6/7/8 Rad Triceps 5 5   WF C6/7 Med FCR     WE C7/8 PIN ECU     F Ab C8/T1  U ADM/FDI 5 5   HF L1/2/3 Fem Illopsoas 5 5   KE L2/3/4 Fem Quad 5 5   DF L4/5 D Peron Tib Ant 5 5   PF S1/2 Tibial Grc/Sol 5 5    Reflexes:  Right Left Comments  Pectoralis  Biceps (C5/6) 1 1   Brachioradialis (C5/6) 1 1    Triceps (C6/7) 1 1    Patellar (L3/4) 1 1    Achilles (S1)      Hoffman      Plantar     Jaw jerk    Sensation:  Light touch intact   Pin prick    Temperature    Vibration   Proprioception    Coordination/Complex Motor:  - Finger to Nose intact - Heel to shin intact - Rapid alternating movement are normal - Gait: Deferred.  Labs   CBC:  Recent Labs  Lab 03/22/21 1600  WBC 8.2  NEUTROABS 5.5  HGB 13.1  HCT 41.0  MCV 91.7  PLT 947    Basic Metabolic Panel:  Lab Results  Component Value Date   NA 136 03/22/2021   K 4.7 03/22/2021   CO2 26 03/22/2021   GLUCOSE 152 (H) 03/22/2021   BUN 17 03/22/2021   CREATININE 1.06 (H) 03/22/2021   CALCIUM 9.1 03/22/2021   GFRNONAA 59 (L) 03/22/2021   GFRAA 76 01/01/2019   Lipid Panel:  Lab Results  Component Value Date   LDLCALC 100 (H) 12/01/2020   HgbA1c:  Lab Results  Component Value Date   HGBA1C 7.1 (H) 10/31/2020   Urine Drug Screen: No results found for: LABOPIA, COCAINSCRNUR, LABBENZ, AMPHETMU, THCU, LABBARB  Alcohol Level No results found for: [place]  CT Head without contrast: I personally reviewed her CTH was negative for a large hypodensity concerning for a large territory infarct or hyperdensity concerning for an ICH  MR angio head without contrast and ultrasound carotid duplex: Pending  MRI Brain pending.  Impression   Caitlyn [name] is a 65 y.o. female with PMH significant for depression, DM2, HTN, GERD who presents with 10 day history of headaches and a 20 mins episode of L Facial droop and LUE weakness yesterday and an episode of R Facial droop and RUE weakness with ?aphasia today. Symptoms completely resolved and no focal deficit.  Episode most concerning for TIA,  unlikely for seizure to present this was specially given the duration and the fact that she had left sided symptoms yesterday and R sided symptoms today.  Recommendations  Plan:  Recommend that primary team order following: - Frequent Neuro checks per stroke unit protocol - Recommend brain imaging with MRI Brain without contrast - Recommend Vascular imaging with MRA Angio Head without contrast and US Carotid doppler - Recommend obtaining TTE - Recommend obtaining Lipid panel with LDL - Please start statin if LDL > 70 - Recommend HbA1c - Antithrombotic - Aspirin 67m daily and plavix 749mdaily x 21 days, followed by Aspirin 8140maily alone. - Recommend DVT ppx - SBP goal - permissive hypertension first 24 h < 220/110. Held home meds.  - Recommend Telemetry monitoring for arrythmia - Recommend bedside swallow screen prior to PO intake. - Stroke education booklet - Recommend PT/OT/SLP consult ______________________________________________________________________   Thank you for the opportunity to take part in the care of this patient. If you have any further questions, please contact the neurology consultation attending.  Signed,  SalLone Jackger Number [phone]_ _   _ __   _ __ _ _  __ __   _ __   __ _

## 2021-03-22 NOTE — Telephone Encounter (Signed)
This encounter was created in error - please disregard.

## 2021-03-22 NOTE — Telephone Encounter (Signed)
Call placed to patient spouse Truman Hayward [phone]~ telephone.   Reports that patient has been voicing C/O HA x1 week. States that on 03/21/2021, patient complained of numbness in L arm and L side of face. Reports that she was also disoriented at that time. Reports that 03/22/2021, patient complained of numbness to R side of face and R arm. Noted that R side of face was drooping, and speech is slurred.   Advised to take patient to ER for evaluation. Verbalized understanding. Agreeable to plan.

## 2021-03-22 NOTE — ED Triage Notes (Addendum)
Pt reports she notice numbness and facial droop on left side yesterday and today on the right side around 1130am  and lasted about 20 mins with a headache and could not " think straight"  NIH = 0

## 2021-03-23 ENCOUNTER — Observation Stay (HOSPITAL_BASED_OUTPATIENT_CLINIC_OR_DEPARTMENT_OTHER): Payer: BC Managed Care – PPO

## 2021-03-23 DIAGNOSIS — I6389 Other cerebral infarction: Secondary | ICD-10-CM | POA: Diagnosis not present

## 2021-03-23 DIAGNOSIS — G459 Transient cerebral ischemic attack, unspecified: Secondary | ICD-10-CM

## 2021-03-23 DIAGNOSIS — E119 Type 2 diabetes mellitus without complications: Secondary | ICD-10-CM

## 2021-03-23 DIAGNOSIS — J45909 Unspecified asthma, uncomplicated: Secondary | ICD-10-CM | POA: Diagnosis not present

## 2021-03-23 DIAGNOSIS — R200 Anesthesia of skin: Secondary | ICD-10-CM | POA: Diagnosis not present

## 2021-03-23 DIAGNOSIS — E1159 Type 2 diabetes mellitus with other circulatory complications: Secondary | ICD-10-CM

## 2021-03-23 DIAGNOSIS — I152 Hypertension secondary to endocrine disorders: Secondary | ICD-10-CM

## 2021-03-23 DIAGNOSIS — E1169 Type 2 diabetes mellitus with other specified complication: Secondary | ICD-10-CM

## 2021-03-23 LAB — ECHOCARDIOGRAM COMPLETE BUBBLE STUDY
AR max vel: 2.98 cm2
AV Area VTI: 3.24 cm2
AV Area mean vel: 2.82 cm2
AV Mean grad: 4 mmHg
AV Peak grad: 7.4 mmHg
Ao pk vel: 1.36 m/s
Area-P 1/2: 2.96 cm2
Calc EF: 66.5 %
S' Lateral: 2.8 cm
Single Plane A2C EF: 60.9 %
Single Plane A4C EF: 69.7 %

## 2021-03-23 LAB — RAPID URINE DRUG SCREEN, HOSP PERFORMED
Amphetamines: NOT DETECTED
Barbiturates: NOT DETECTED
Benzodiazepines: NOT DETECTED
Cocaine: NOT DETECTED
Opiates: NOT DETECTED
Tetrahydrocannabinol: NOT DETECTED

## 2021-03-23 LAB — GLUCOSE, CAPILLARY
Glucose-Capillary: 133 mg/dL — ABNORMAL HIGH (ref 70–99)
Glucose-Capillary: 140 mg/dL — ABNORMAL HIGH (ref 70–99)
Glucose-Capillary: 142 mg/dL — ABNORMAL HIGH (ref 70–99)
Glucose-Capillary: 139 mg/dL — ABNORMAL HIGH (ref 70–99)

## 2021-03-23 LAB — HIV ANTIBODY (ROUTINE TESTING W REFLEX): HIV Screen 4th Generation wRfx: NONREACTIVE

## 2021-03-23 MED ORDER — FENTANYL CITRATE (PF) 100 MCG/2ML IJ SOLN
50.0000 ug | Freq: Once | INTRAMUSCULAR | Status: AC
  Administered 2021-03-23: 50 ug via INTRAVENOUS
  Filled 2021-03-23: qty 2

## 2021-03-23 MED ORDER — TOPIRAMATE 25 MG PO TABS: 50.0000 mg | ORAL_TABLET | Freq: Two times a day (BID) | ORAL | Status: DC

## 2021-03-23 MED ORDER — ATORVASTATIN CALCIUM 10 MG PO TABS: 20.0000 mg | ORAL_TABLET | Freq: Every day | ORAL | 1 refills | Status: DC

## 2021-03-23 MED ORDER — TOPIRAMATE 50 MG PO TABS: 50.0000 mg | ORAL_TABLET | Freq: Two times a day (BID) | ORAL | 0 refills | Status: DC

## 2021-03-23 MED ORDER — CLOPIDOGREL BISULFATE 75 MG PO TABS: 75.0000 mg | ORAL_TABLET | Freq: Every day | ORAL | 0 refills | Status: DC

## 2021-03-23 MED ORDER — ATORVASTATIN CALCIUM 10 MG PO TABS: 20.0000 mg | ORAL_TABLET | Freq: Every day | ORAL | Status: DC

## 2021-03-23 MED ORDER — ASPIRIN 81 MG PO CHEW: 81.0000 mg | CHEWABLE_TABLET | Freq: Every day | ORAL | Status: DC

## 2021-03-23 NOTE — Progress Notes (Signed)
Carotid duplex bilateral study completed.   Please see CV Proc for preliminary results.   Tracker Mance, RDMS, RVT  

## 2021-03-23 NOTE — Progress Notes (Signed)
SLP Cancellation Note  Patient Details Name: Caitlyn [name] MRN: [id number] DOB: 1956-02-24   Cancelled treatment:       Reason Eval/Treat Not Completed: SLP screened, no needs identified, will sign off  [name] L. Tivis Ringer, [place] CCC/SLP Acute Rehabilitation Services Office number [phone] Pager [phone]  [name] [name] 03/23/2021, 10:35 AM

## 2021-03-23 NOTE — Progress Notes (Signed)
*  PRELIMINARY RESULTS* Echocardiogram 2D Echocardiogram with bubble study has been performed.  Luisa Hart, [place] 03/23/2021, 8:55 AM

## 2021-03-23 NOTE — Progress Notes (Addendum)
STROKE TEAM PROGRESS NOTE   INTERVAL HISTORY Her husband  is at the bedside.  Patient is awake, alert, NAD. Starting topamax 50 mg BID for migraines, and starting atorvastatin for LDL.Marland Kitchen  MRI scan is negative for any acute abnormality and MRI of the brain shows no large vessel stenosis or occlusion.  LDL cholesterol is 101 mg percent.  Hemoglobin A1c 6.7.  Vitals:   03/23/21 0358 03/23/21 0800 03/23/21 0814 03/23/21 1208  BP: (!) 136/119   (!) 167/85  Pulse: 96   86  Resp: 18 17 18 17   Temp: 97.9 F (36.6 C)   98.5 F (36.9 C)  TempSrc: Oral   Oral  SpO2: 95%   96%  Weight:      Height:       CBC:  Recent Labs  Lab 03/22/21 1600  WBC 8.2  NEUTROABS 5.5  HGB 13.1  HCT 41.0  MCV 91.7  PLT 300   Basic Metabolic Panel:  Recent Labs  Lab 03/22/21 1600  NA 136  K 4.7  CL 102  CO2 26  GLUCOSE 152*  BUN 17  CREATININE 1.06*  CALCIUM 9.1   Lipid Panel:  Recent Labs  Lab 03/22/21 1943  CHOL 160  TRIG 92  HDL 41  CHOLHDL 3.9  VLDL 18  LDLCALC 101*   HgbA1c:  Recent Labs  Lab 03/22/21 1620  HGBA1C 6.7*   Urine Drug Screen:  Recent Labs  Lab 03/23/21 0854  LABOPIA NONE DETECTED  COCAINSCRNUR NONE DETECTED  LABBENZ NONE DETECTED  AMPHETMU NONE DETECTED  THCU NONE DETECTED  LABBARB NONE DETECTED    IMAGING past 24 hours CT Head Wo Contrast  Result Date: 03/22/2021 CLINICAL DATA:  Numbness and facial droop on the left side yesterday and on the right side today. EXAM: CT HEAD WITHOUT CONTRAST TECHNIQUE: Contiguous axial images were obtained from the base of the skull through the vertex without intravenous contrast. COMPARISON:  CT paranasal sinuses dated 12/08/2003. FINDINGS: Brain: No evidence of acute infarction, hemorrhage, hydrocephalus, extra-axial collection or mass lesion/mass effect. Periventricular white matter hypoattenuation likely represents chronic small vessel ischemic disease. Vascular: There are vascular calcifications in the carotid siphons.  Skull: Normal. Negative for fracture or focal lesion. Sinuses/Orbits: No acute finding. Other: None. IMPRESSION: No acute intracranial process. Electronically Signed   By: Zerita Boers M.D.   On: 03/22/2021 16:40   MR ANGIO HEAD WO CONTRAST  Result Date: 03/22/2021 CLINICAL DATA:  Initial evaluation for neuro deficit, stroke suspected, left-sided facial droop and numbness. EXAM: MRI HEAD WITHOUT CONTRAST MRA HEAD WITHOUT CONTRAST TECHNIQUE: Multiplanar, multiecho pulse sequences of the brain and surrounding structures were obtained without intravenous contrast. Angiographic images of the head were obtained using MRA technique without contrast. COMPARISON:  Prior head CT from earlier the same day. FINDINGS: MRI HEAD FINDINGS Brain: Cerebral volume within normal limits for age. Mild scattered patchy T2/FLAIR hyperintensity involving the periventricular and deep white matter both cerebral hemispheres as well as the pons, most consistent with chronic small vessel ischemic disease, mild to moderate in nature at the pons. Superimposed remote lacunar infarct present at the deep white matter of the left frontal centrum semi ovale. Few tiny remote bilateral cerebellar infarcts noted. No abnormal foci of restricted diffusion to suggest acute or subacute ischemia. Gray-white matter differentiation maintained. No encephalomalacia to suggest chronic cortical infarction. No foci of susceptibility artifact to suggest acute or chronic intracranial hemorrhage. No mass lesion, midline shift or mass effect. No hydrocephalus or extra-axial fluid  collection. Pituitary gland suprasellar region within normal limits. Midline structures intact. Vascular: Major intracranial vascular flow voids are maintained. Skull and upper cervical spine: Craniocervical junction within normal limits. Bone marrow signal intensity normal. No focal marrow replacing lesion. No scalp soft tissue abnormality. Sinuses/Orbits: Patient status post bilateral  ocular lens replacement. Globes and orbital soft tissues demonstrate no acute finding. Paranasal sinuses are largely clear. No significant mastoid effusion. Inner ear structures grossly normal. Other: None. MRA HEAD FINDINGS Examination mildly degraded by motion artifact. ANTERIOR CIRCULATION: Visualized distal cervical segments of the internal carotid arteries are patent with antegrade flow. Distal cervical right ICA tortuous. Petrous, cavernous, and supraclinoid segments patent without appreciable stenosis or other abnormality. A1 segments patent. Normal anterior communicating artery complex. Anterior cerebral arteries patent to their distal aspects without stenosis. No M1 stenosis or occlusion. Normal MCA bifurcations. Distal MCA branches perfused and symmetric. Suspected diffuse small vessel atheromatous irregularity. POSTERIOR CIRCULATION: Right vertebral artery strongly dominant and widely patent to the vertebrobasilar junction. Left vertebral artery hypoplastic and patent as well. Both PICA origins patent and normal. Basilar mildly irregular but patent to its distal aspect without stenosis. Superior cerebellar arteries patent bilaterally. Both PCAs primarily supplied via the basilar. PCAs mildly irregular but widely patent proximally, not well evaluated distally due to motion. Question severe stenosis at the distal left P2/P3 junction (series 1027, image 15). No intracranial aneurysm. IMPRESSION: MRI HEAD IMPRESSION: 1. No acute intracranial infarct or other abnormality. 2. Mild to moderate chronic microvascular ischemic disease, with a few scattered remote lacunar infarcts involving the deep white matter of the left frontal centrum semi ovale and cerebellum. MRA HEAD IMPRESSION: 1. Motion degraded exam. 2. Negative intracranial MRA for large vessel occlusion. 3. Question severe distal left P2/P3 junction stenosis. No other hemodynamically significant or correctable stenosis. 4. Suspected diffuse small vessel  atheromatous irregularity. Electronically Signed   By: Jeannine Boga M.D.   On: 03/22/2021 23:40   MR BRAIN WO CONTRAST  Result Date: 03/22/2021 CLINICAL DATA:  Initial evaluation for neuro deficit, stroke suspected, left-sided facial droop and numbness. EXAM: MRI HEAD WITHOUT CONTRAST MRA HEAD WITHOUT CONTRAST TECHNIQUE: Multiplanar, multiecho pulse sequences of the brain and surrounding structures were obtained without intravenous contrast. Angiographic images of the head were obtained using MRA technique without contrast. COMPARISON:  Prior head CT from earlier the same day. FINDINGS: MRI HEAD FINDINGS Brain: Cerebral volume within normal limits for age. Mild scattered patchy T2/FLAIR hyperintensity involving the periventricular and deep white matter both cerebral hemispheres as well as the pons, most consistent with chronic small vessel ischemic disease, mild to moderate in nature at the pons. Superimposed remote lacunar infarct present at the deep white matter of the left frontal centrum semi ovale. Few tiny remote bilateral cerebellar infarcts noted. No abnormal foci of restricted diffusion to suggest acute or subacute ischemia. Gray-white matter differentiation maintained. No encephalomalacia to suggest chronic cortical infarction. No foci of susceptibility artifact to suggest acute or chronic intracranial hemorrhage. No mass lesion, midline shift or mass effect. No hydrocephalus or extra-axial fluid collection. Pituitary gland suprasellar region within normal limits. Midline structures intact. Vascular: Major intracranial vascular flow voids are maintained. Skull and upper cervical spine: Craniocervical junction within normal limits. Bone marrow signal intensity normal. No focal marrow replacing lesion. No scalp soft tissue abnormality. Sinuses/Orbits: Patient status post bilateral ocular lens replacement. Globes and orbital soft tissues demonstrate no acute finding. Paranasal sinuses are largely  clear. No significant mastoid effusion. Inner ear structures grossly normal.  Other: None. MRA HEAD FINDINGS Examination mildly degraded by motion artifact. ANTERIOR CIRCULATION: Visualized distal cervical segments of the internal carotid arteries are patent with antegrade flow. Distal cervical right ICA tortuous. Petrous, cavernous, and supraclinoid segments patent without appreciable stenosis or other abnormality. A1 segments patent. Normal anterior communicating artery complex. Anterior cerebral arteries patent to their distal aspects without stenosis. No M1 stenosis or occlusion. Normal MCA bifurcations. Distal MCA branches perfused and symmetric. Suspected diffuse small vessel atheromatous irregularity. POSTERIOR CIRCULATION: Right vertebral artery strongly dominant and widely patent to the vertebrobasilar junction. Left vertebral artery hypoplastic and patent as well. Both PICA origins patent and normal. Basilar mildly irregular but patent to its distal aspect without stenosis. Superior cerebellar arteries patent bilaterally. Both PCAs primarily supplied via the basilar. PCAs mildly irregular but widely patent proximally, not well evaluated distally due to motion. Question severe stenosis at the distal left P2/P3 junction (series 1027, image 15). No intracranial aneurysm. IMPRESSION: MRI HEAD IMPRESSION: 1. No acute intracranial infarct or other abnormality. 2. Mild to moderate chronic microvascular ischemic disease, with a few scattered remote lacunar infarcts involving the deep white matter of the left frontal centrum semi ovale and cerebellum. MRA HEAD IMPRESSION: 1. Motion degraded exam. 2. Negative intracranial MRA for large vessel occlusion. 3. Question severe distal left P2/P3 junction stenosis. No other hemodynamically significant or correctable stenosis. 4. Suspected diffuse small vessel atheromatous irregularity. Electronically Signed   By: Jeannine Boga M.D.   On: 03/22/2021 23:40    ECHOCARDIOGRAM COMPLETE BUBBLE STUDY  Result Date: 03/23/2021    ECHOCARDIOGRAM REPORT   Patient Name:   KERRILYNN DERENZO Date of Exam: 03/23/2021 Medical Rec #:  [id number]   Height:       67.0 in Accession #:    [id number]  Weight:       260.0 lb Date of Birth:  03-Jul-1956   BSA:          2.261 m Patient Age:    13 years    BP:           167/85 mmHg Patient Gender: F           HR:           86 bpm. Exam Location:  Inpatient Procedure: 2D Echo, Cardiac Doppler, Color Doppler and Saline Contrast Bubble            Study Indications:    Stroke 434.91 / I63.9  History:        Patient has prior history of Echocardiogram examinations, most                 recent 09/01/2015. Risk Factors:Diabetes and Hypertension.                 Asthma.  Sonographer:    [place] Referring Phys: [id number] [place]  1. Left ventricular ejection fraction, by estimation, is 60 to 65%. The left ventricle has normal function. The left ventricle has no regional wall motion abnormalities. There is moderate concentric left ventricular hypertrophy. Left ventricular diastolic parameters are consistent with Grade I diastolic dysfunction (impaired relaxation). Elevated left ventricular end-diastolic pressure.  2. Right ventricular systolic function is normal. The right ventricular size is normal. There is normal pulmonary artery systolic pressure.  3. The mitral valve is normal in structure. Trivial mitral valve regurgitation. No evidence of mitral stenosis.  4. The aortic valve is tricuspid. Aortic valve regurgitation is not visualized. No aortic stenosis is present.  5.  Aortic dilatation noted. There is mild dilatation of the ascending aorta, measuring 37 mm. There is mild dilatation of the aortic arch, measuring 41 mm.  6. The inferior vena cava is normal in size with greater than 50% respiratory variability, suggesting right atrial pressure of 3 mmHg.  7. Agitated saline contrast bubble study was negative, with no  evidence of any interatrial shunt. FINDINGS  Left Ventricle: Left ventricular ejection fraction, by estimation, is 60 to 65%. The left ventricle has normal function. The left ventricle has no regional wall motion abnormalities. The left ventricular internal cavity size was normal in size. There is  moderate concentric left ventricular hypertrophy. Left ventricular diastolic parameters are consistent with Grade I diastolic dysfunction (impaired relaxation). Elevated left ventricular end-diastolic pressure. Right Ventricle: The right ventricular size is normal. No increase in right ventricular wall thickness. Right ventricular systolic function is normal. There is normal pulmonary artery systolic pressure. The tricuspid regurgitant velocity is 2.08 m/s, and  with an assumed right atrial pressure of 3 mmHg, the estimated right ventricular systolic pressure is 76.7 mmHg. Left Atrium: Left atrial size was normal in size. Right Atrium: Right atrial size was normal in size. Pericardium: There is no evidence of pericardial effusion. Mitral Valve: The mitral valve is normal in structure. Trivial mitral valve regurgitation. No evidence of mitral valve stenosis. Tricuspid Valve: The tricuspid valve is normal in structure. Tricuspid valve regurgitation is trivial. No evidence of tricuspid stenosis. Aortic Valve: The aortic valve is tricuspid. Aortic valve regurgitation is not visualized. No aortic stenosis is present. Aortic valve mean gradient measures 4.0 mmHg. Aortic valve peak gradient measures 7.4 mmHg. Aortic valve area, by VTI measures 3.24 cm. Pulmonic Valve: The pulmonic valve was normal in structure. Pulmonic valve regurgitation is not visualized. No evidence of pulmonic stenosis. Aorta: Aortic dilatation noted. There is mild dilatation of the ascending aorta, measuring 37 mm. There is mild dilatation of the aortic arch, measuring 41 mm. Venous: The inferior vena cava is normal in size with greater than 50%  respiratory variability, suggesting right atrial pressure of 3 mmHg. IAS/Shunts: No atrial level shunt detected by color flow Doppler. Agitated saline contrast was given intravenously to evaluate for intracardiac shunting. Agitated saline contrast bubble study was negative, with no evidence of any interatrial shunt.  LEFT VENTRICLE PLAX 2D LVIDd:         4.10 cm     Diastology LVIDs:         2.80 cm     LV e' medial:    4.28 cm/s LV PW:         1.40 cm     LV E/e' medial:  18.4 LV IVS:        1.30 cm     LV e' lateral:   5.33 cm/s LVOT diam:     2.30 cm     LV E/e' lateral: 14.8 LV SV:         97 LV SV Index:   43 LVOT Area:     4.15 cm  LV Volumes (MOD) LV vol d, MOD A2C: 41.9 ml LV vol d, MOD A4C: 69.9 ml LV vol s, MOD A2C: 16.4 ml LV vol s, MOD A4C: 21.2 ml LV SV MOD A2C:     25.5 ml LV SV MOD A4C:     69.9 ml LV SV MOD BP:      37.9 ml RIGHT VENTRICLE RV S prime:     17.90 cm/s TAPSE (M-mode): 2.1 cm LEFT  ATRIUM             Index       RIGHT ATRIUM           Index LA Vol (A2C):   34.8 ml 15.39 ml/m RA Area:     14.80 cm LA Vol (A4C):   46.0 ml 20.35 ml/m RA Volume:   36.40 ml  16.10 ml/m LA Biplane Vol: 41.4 ml 18.31 ml/m  AORTIC VALVE                   PULMONIC VALVE AV Area (Vmax):    2.98 cm    PV Vmax:       1.12 m/s AV Area (Vmean):   2.82 cm    PV Vmean:      78.550 cm/s AV Area (VTI):     3.24 cm    PV VTI:        0.248 m AV Vmax:           136.00 cm/s PV Peak grad:  5.1 mmHg AV Vmean:          99.900 cm/s PV Mean grad:  2.5 mmHg AV VTI:            0.299 m AV Peak Grad:      7.4 mmHg AV Mean Grad:      4.0 mmHg LVOT Vmax:         97.50 cm/s LVOT Vmean:        67.700 cm/s LVOT VTI:          0.233 m LVOT/AV VTI ratio: 0.78  AORTA Ao Root diam: 2.80 cm Ao Asc diam:  3.70 cm MITRAL VALVE               TRICUSPID VALVE MV Area (PHT): 2.96 cm    TR Peak grad:   17.3 mmHg MV Decel Time: 256 msec    TR Vmax:        208.00 cm/s MV E velocity: 78.80 cm/s MV A velocity: 95.50 cm/s  SHUNTS MV E/A ratio:   0.83        Systemic VTI:  0.23 m                            Systemic Diam: 2.30 cm Skeet Latch MD Electronically signed by Skeet Latch MD Signature Date/Time: 03/23/2021/2:48:09 PM    Final    VAS US CAROTID  Result Date: 03/23/2021 Carotid Arterial Duplex Study Patient Name:  FE OKUBO  Date of Exam:   03/23/2021 Medical Rec #: [id number]    Accession #:    [id number] Date of Birth: 1956/11/09    Patient Gender: F Patient Age:   4B Exam Location:  [facility] Procedure:      VAS US CAROTID Referring Phys: [id number] [place] --------------------------------------------------------------------------------  Indications:       Stroke. Risk Factors:      Hypertension, Diabetes. Comparison Study:  03-22-2021 MRA head showed hypoplastic but patent LT                    vertebral artery. Performing Technologist: Darlin Coco RDMS,RVT  Examination Guidelines: A complete evaluation includes B-mode imaging, spectral Doppler, color Doppler, and power Doppler as needed of all accessible portions of each vessel. Bilateral testing is considered an integral part of a complete examination. Limited examinations for reoccurring indications may be performed as noted.  Right Carotid Findings: +----------+--------+--------+--------+------------------+--------+  PSV cm/sEDV cm/sStenosisPlaque DescriptionComments +----------+--------+--------+--------+------------------+--------+ CCA Prox  106     17                                         +----------+--------+--------+--------+------------------+--------+ CCA Distal75      17              heterogenous               +----------+--------+--------+--------+------------------+--------+ ICA Prox  56      21      1-39%                              +----------+--------+--------+--------+------------------+--------+ ICA Distal68      20                                tortuous  +----------+--------+--------+--------+------------------+--------+ ECA       226     28      >50%    heterogenous               +----------+--------+--------+--------+------------------+--------+ +----------+--------+-------+----------------+-------------------+           PSV cm/sEDV cmsDescribe        Arm Pressure (mmHG) +----------+--------+-------+----------------+-------------------+ Subclavian127            Multiphasic, WNL                    +----------+--------+-------+----------------+-------------------+ +---------+--------+--+--------+--+---------+ VertebralPSV cm/s67EDV cm/s23Antegrade +---------+--------+--+--------+--+---------+  Left Carotid Findings: +----------+--------+--------+--------+------------------+--------+           PSV cm/sEDV cm/sStenosisPlaque DescriptionComments +----------+--------+--------+--------+------------------+--------+ CCA Prox  112     22                                         +----------+--------+--------+--------+------------------+--------+ CCA Distal63      16                                         +----------+--------+--------+--------+------------------+--------+ ICA Prox  52      18                                         +----------+--------+--------+--------+------------------+--------+ ICA Distal138     48                                tortuous +----------+--------+--------+--------+------------------+--------+ ECA       84      8                                          +----------+--------+--------+--------+------------------+--------+ +----------+--------+--------+----------------+-------------------+           PSV cm/sEDV cm/sDescribe        Arm Pressure (mmHG) +----------+--------+--------+----------------+-------------------+ [id number]             Multiphasic, WNL                    +----------+--------+--------+----------------+-------------------+  +---------+--------+--------+--------------+  [place] cm/sEDV cm/sNot identified +---------+--------+--------+--------------+   Summary: Right Carotid: Velocities in the right ICA are consistent with a 1-39% stenosis.                The ECA appears >50% stenosed. Left Carotid: The extracranial vessels were near-normal with only minimal wall               thickening or plaque. Vertebrals:  Right vertebral artery demonstrates antegrade flow. Left vertebral              artery was not visualized. Subclavians: Normal flow hemodynamics were seen in bilateral subclavian              arteries. *See table(s) above for measurements and observations.  Electronically signed by Antony Contras MD on 03/23/2021 at 1:13:31 PM.    Final     PHYSICAL EXAM Pleasant middle-age obese Caucasian lady not in distress.   . Afebrile. Head is nontraumatic. Neck is supple without bruit.    Cardiac exam no murmur or gallop. Lungs are clear to auscultation. Distal pulses are well felt.  Neurological Exam ;  Awake  Alert oriented x 3. Normal speech and language.eye movements full without nystagmus.fundi were not visualized. Vision acuity and fields appear normal. Hearing is normal. Palatal movements are normal. Face symmetric. Tongue midline. Normal strength, tone, reflexes and coordination. Normal sensation. Gait deferred.    ASSESSMENT/PLAN Ms. Caitlyn [name] is a 65 y.o. female depression, DM2, HTN, GERD who presents with 10 day history of headaches and episodes of intermittent facial droop and arm weakness.  Stroke like symptoms: likely atypical migraine accompanied by bilateral facial/hand numbness and tingling.   MRI  1. No acute intracranial infarct or other abnormality. 2. Mild to moderate chronic microvascular ischemic disease, with a  few scattered remote lacunar infarcts involving the deep white matter of the left frontal centrum semi ovale and cerebellum.  MRA  : 1. Motion degraded exam. 2. Negative  intracranial MRA for large vessel occlusion. 3. Question severe distal left P2/P3 junction stenosis. No other hemodynamically significant or correctable stenosis. 4. Suspected diffuse small vessel atheromatous irregularity.  Carotid Doppler  Right Carotid: Velocities in the right ICA are consistent with a 1-39%  stenosis.   The ECA appears >50% stenosed.  Left Carotid: The extracranial vessels were near-normal with only minimal  wall thickening or plaque.   Vertebrals: Right vertebral artery demonstrates antegrade flow. Left  Vertebral artery was not visualized.  Subclavians: Normal flow hemodynamics were seen in bilateral subclavian arteries.   2D Echo 1. Left ventricular ejection fraction, by estimation, is 60 to 65%. The  left ventricle has normal function. The left ventricle has no regional  wall motion abnormalities. There is moderate concentric left ventricular  hypertrophy. Left ventricular  diastolic parameters are consistent with Grade I diastolic dysfunction  (impaired relaxation). Elevated left ventricular end-diastolic pressure.  2. Right ventricular systolic function is normal. The right ventricular  size is normal. There is normal pulmonary artery systolic pressure.  3. The mitral valve is normal in structure. Trivial mitral valve  regurgitation. No evidence of mitral stenosis.  4. The aortic valve is tricuspid. Aortic valve regurgitation is not  visualized. No aortic stenosis is present.  5. Aortic dilatation noted. There is mild dilatation of the ascending  aorta, measuring 37 mm. There is mild dilatation of the aortic arch,  measuring 41 mm.  6. The inferior vena cava is normal in size with greater than 50%  respiratory variability, suggesting  right atrial pressure of 3 mmHg.  7. Agitated saline contrast bubble study was negative, with no evidence  of any interatrial shunt.  LDL 101  HgbA1c 6.7  VTE prophylaxis - lovenox    Diet   Diet Heart Room  service appropriate? Yes; Fluid consistency: Thin     No antithrombotic prior to admission, now on aspirin 81 mg daily.   Therapy recommendations:  No OT/PT needs  Disposition:  home  Hypertension  Home meds:  Amlodipine 10  Stable . Permissive hypertension (OK if < 220/120) but gradually normalize in 5-7 days . Long-term BP goal normotensive  Hyperlipidemia  Home meds:  none,    LDL 101, goal < 70  Add atorvastatin 20 mg daily  Continue statin at discharge  Diabetes type II Controlled  Home meds:  metformin  HgbA1c 6.7, goal < 7.0  CBGs Recent Labs    03/23/21 0633 03/23/21 0700 03/23/21 1208  GLUCAP 142* 140* 139*    Other Stroke Risk Factors  Obesity, Body mass index is 40.72 kg/m., BMI >/= 30 associated with increased stroke risk, recommend weight loss, diet and exercise as appropriate   Family hx stroke (CVA) Hospital day # 0  Laurey Morale, MSN, NP-C [facility] [phone] I have personally obtained history,examined this patient, reviewed notes, independently viewed imaging studies, participated in medical decision making and plan of care.ROS completed by me personally and pertinent positives fully documented  I have made any additions or clarifications directly to the above note. Agree with note above.  She presented with headache with bilateral facial and hand numbness and paresthesias likely her typical migraine with negative brain MRI scan and MRA brain.  Recommend trial of Topamax for migraine.  Long discussion patient and answered questions.  Greater than 50% time during this 35-minute visit was spent in counseling and coordination of care about headache and strokelike episodes and atypical migraine.  Follow-up as an outpatient stroke clinic in 6 weeks.  Discussed with Dr. Dierdre Forth, [place] Pager: [phone] 03/23/2021 5:03 PM   To contact Stroke Continuity provider, please  refer to [email or web address] After hours, contact General Neurology

## 2021-03-23 NOTE — Discharge Summary (Signed)
Physician Discharge Summary  Caitlyn [name] [facility] YFV:[id number] DOB: [birth year] DOA: 03/22/2021  PCP: Susy Frizzle, MD  Admit date: 03/22/2021 Discharge date: 03/23/2021  Time spent: 45 minutes  Recommendations for Outpatient Follow-up:  Patient will be discharged to home.  Patient will need to follow up with primary care provider within one week of discharge.  Follow up with neurology. Patient should continue medications as prescribed.  Patient should follow a heart healthy/carb modified diet.    Discharge Diagnoses:  Transient facial droop with hand numbness and weakness, likely TIA, CVA ruled out Diabetes mellitus, type II Essential hypertension Asthma Depression  Discharge Condition: stable  Diet recommendation: heart healthy/carb modified  Filed Weights   03/22/21 1802  Weight: 117.9 kg    History of present illness:  On 03/22/2021 by Dr. Zada Finders Caitlyn [name] is a 65 y.o. female with medical history significant for type 2 diabetes, hypertension, asthma, depression who presents to the ED for evaluation of transient facial droop, hand weakness.  Patient states yesterday (4/27) she had new onset of left hand weakness and left facial droop with numbness at both sites.  Symptoms resolved after about 20 minutes.  She says that she was working on her computer and became confused at what she was looking at on the computer which was unusual for her.  Today around 11:30 AM she developed right hand weakness/numbness, right facial droop/numbness, and apparent slurred speech observed by her husband.  She also noted a headache at her left temple today which occurred around the same time.  She did have a headache yesterday but did not pay attention to the localization.  She has had some nausea without emesis.  She otherwise denies any chest pain, palpitations, dyspnea, cough, abdominal pain, dysuria, or diarrhea.  She denies any tobacco, alcohol, or illicit drug use.  She reports a  history of stroke in her father and diabetes in her brother.  Hospital Course:  Transient facial droop with hand numbness and weakness, likely TIA, CVA ruled out -Patient states she had an episode with hand weakness and numbness along with headache on 4/27 and 4/28.  Symptoms resolved within 20 minutes -CT head showed no acute intercranial process -MRI brain showed no acute cranial infarct or other abnormality.  Mild to moderate chronic microvascular ischemic disease with a few scattered remote lacunar infarcts involving deep white matter of the left frontal centrum semi ovale and cerebellum -MRA head negative for intracranial MRA for large vessel occlusion.  Question severe distal left P2/P3 junction stenosis.  Suspected diffuse small vessel atheromatous irregularity -Carotid Doppler: Right ICA 1 to 39% stenosis.  Extracranial vessels were near normal with only minimal wall thickening or plaque.  Right vertebral artery demonstrates antegrade flow.  Left vertebral artery not visualized.  Normal flow hemodynamics seen in bilateral subclavian arteries -Echocardiogram with bubble study showed EF of 60 to 65%, no regional wall motion abnormalities.  Moderate LVH.  Grade 1 diastolic dysfunction.  Trivial mitral valve regurgitation.  Mild dilatation of the ascending aorta measuring 37 mm, mild dilatation of the aortic arch measuring 41 mm.  Agitated saline contrast bubble study was negative, no evidence of intra-arterial shunt. (Given dilatation of aortic arch, would recommend patient have follow-up with her PCP for surveillance) -A1c 6.7, LDL 101 -PT evaluated patient, no follow-up recommended -Speech therapy evaluated patient, no follow-up recommended -Neurology consulted and appreciated-recommended continuing aspirin and Plavix for 3 weeks followed by aspirin alone.  Feels this may also be atypical migraine, patient  placed on Topamax. -Currently on aspirin and Plavix, statin  Diabetes mellitus, type  II -Metformin held, can resume on discharge -Lowman A1c 6.7  Essential hypertension -Continue metoprolol, amlodipine  Asthma -Stable, continue Singulair and albuterol  Depression -Continue Effexor  Procedures: Echocardiogram Carotid Doppler  Consultations: Neurology  Discharge Exam: Vitals:   03/23/21 0814 03/23/21 1208  BP:  (!) 167/85  Pulse:  86  Resp: 18 17  Temp:  98.5 F (36.9 C)  SpO2:  96%     General: Well developed, well nourished, NAD, appears stated age  HEENT: NCAT, PERRLA, EOMI, Anicteic Sclera, mucous membranes moist.  Neck: Supple  Cardiovascular: S1 S2 auscultated, no rubs, murmurs or gallops. Regular rate and rhythm.  Respiratory: Clear to auscultation bilaterally with equal chest rise  Abdomen: Soft, nontender, nondistended, + bowel sounds  Extremities: warm dry without cyanosis clubbing or edema  Neuro: AAOx3, cranial nerves grossly intact. Strength 5/5 in patient's upper and lower extremities bilaterally  Skin: Without rashes exudates or nodules  Psych: Normal affect and demeanor with intact judgement and insight  Discharge Instructions Discharge Instructions    Discharge instructions   Complete by: As directed    Patient will need to follow up with primary care provider within one week of discharge.  Follow up with neurology. Patient should continue medications as prescribed.  Patient should follow a heart healthy/carb modified diet.   Increase activity slowly   Complete by: As directed      Allergies as of 03/23/2021      Reactions   Erythromycin Other (See Comments)   Gi upset/ cramps   Hctz [hydrochlorothiazide]    CAUSED GOUT   Macrolides And Ketolides Other (See Comments)   unknown      Medication List    TAKE these medications   Accu-Chek FastClix Lancets Misc Tes bid   albuterol 108 (90 Base) MCG/ACT inhaler Commonly known as: VENTOLIN HFA Inhale 2 puffs into the lungs every 4 (four) hours as needed for  wheezing or shortness of breath.   amLODipine 10 MG tablet Commonly known as: NORVASC TAKE 1 TABLET(10 MG) BY MOUTH DAILY   aspirin 81 MG chewable tablet Chew 1 tablet (81 mg total) by mouth daily. Start taking on: March 24, 2021   atorvastatin 10 MG tablet Commonly known as: LIPITOR Take 2 tablets (20 mg total) by mouth daily.   blood glucose meter kit and supplies Dispense based on patient and insurance preference. Check BS BID. (FOR ICD-10 E11.9).   clopidogrel 75 MG tablet Commonly known as: PLAVIX Take 1 tablet (75 mg total) by mouth daily. Start taking on: March 24, 2021   Cosentyx Sensoready (300 MG) 150 MG/ML Soaj Generic drug: Secukinumab (300 MG Dose) Inject 150 mg into the muscle every 30 (thirty) days.   fexofenadine 180 MG tablet Commonly known as: ALLEGRA Take 180 mg by mouth at bedtime.   metFORMIN 1000 MG tablet Commonly known as: GLUCOPHAGE Take 1 tablet (1,000 mg total) by mouth 2 (two) times daily with a meal.   metoprolol succinate 25 MG 24 hr tablet Commonly known as: TOPROL-XL Take 1 tablet (25 mg total) by mouth daily.   montelukast 10 MG tablet Commonly known as: SINGULAIR TAKE 1 TABLET(10 MG) BY MOUTH AT BEDTIME   topiramate 50 MG tablet Commonly known as: TOPAMAX Take 1 tablet (50 mg total) by mouth 2 (two) times daily.   venlafaxine XR 150 MG 24 hr capsule Commonly known as: EFFEXOR-XR TAKE 1 CAPSULE BY MOUTH EVERY  DAY      Allergies  Allergen Reactions  . Erythromycin Other (See Comments)    Gi upset/ cramps  . Hctz [Hydrochlorothiazide]     CAUSED GOUT  . Macrolides And Ketolides Other (See Comments)    unknown    Follow-up Information    Susy Frizzle, MD. Schedule an appointment as soon as possible for a visit in 1 week(s).   Specialty: Family Medicine Why: Hospital follow-up Contact information: [address] [place] [address] [phone]        Garvin Fila, MD. Schedule an appointment as soon  as possible for a visit in 4 week(s).   Specialties: Neurology, Radiology Why: Hospital follow up Contact information: [address] [place] [address] [phone]                The results of significant diagnostics from this hospitalization (including imaging, microbiology, ancillary and laboratory) are listed below for reference.    Significant Diagnostic Studies: CT Head Wo Contrast  Result Date: 03/22/2021 CLINICAL DATA:  Numbness and facial droop on the left side yesterday and on the right side today. EXAM: CT HEAD WITHOUT CONTRAST TECHNIQUE: Contiguous axial images were obtained from the base of the skull through the vertex without intravenous contrast. COMPARISON:  CT paranasal sinuses dated 12/08/2003. FINDINGS: Brain: No evidence of acute infarction, hemorrhage, hydrocephalus, extra-axial collection or mass lesion/mass effect. Periventricular white matter hypoattenuation likely represents chronic small vessel ischemic disease. Vascular: There are vascular calcifications in the carotid siphons. Skull: Normal. Negative for fracture or focal lesion. Sinuses/Orbits: No acute finding. Other: None. IMPRESSION: No acute intracranial process. Electronically Signed   By: Zerita Boers M.D.   On: 03/22/2021 16:40   MR ANGIO HEAD WO CONTRAST  Result Date: 03/22/2021 CLINICAL DATA:  Initial evaluation for neuro deficit, stroke suspected, left-sided facial droop and numbness. EXAM: MRI HEAD WITHOUT CONTRAST MRA HEAD WITHOUT CONTRAST TECHNIQUE: Multiplanar, multiecho pulse sequences of the brain and surrounding structures were obtained without intravenous contrast. Angiographic images of the head were obtained using MRA technique without contrast. COMPARISON:  Prior head CT from earlier the same day. FINDINGS: MRI HEAD FINDINGS Brain: Cerebral volume within normal limits for age. Mild scattered patchy T2/FLAIR hyperintensity involving the periventricular and deep white matter  both cerebral hemispheres as well as the pons, most consistent with chronic small vessel ischemic disease, mild to moderate in nature at the pons. Superimposed remote lacunar infarct present at the deep white matter of the left frontal centrum semi ovale. Few tiny remote bilateral cerebellar infarcts noted. No abnormal foci of restricted diffusion to suggest acute or subacute ischemia. Gray-white matter differentiation maintained. No encephalomalacia to suggest chronic cortical infarction. No foci of susceptibility artifact to suggest acute or chronic intracranial hemorrhage. No mass lesion, midline shift or mass effect. No hydrocephalus or extra-axial fluid collection. Pituitary gland suprasellar region within normal limits. Midline structures intact. Vascular: Major intracranial vascular flow voids are maintained. Skull and upper cervical spine: Craniocervical junction within normal limits. Bone marrow signal intensity normal. No focal marrow replacing lesion. No scalp soft tissue abnormality. Sinuses/Orbits: Patient status post bilateral ocular lens replacement. Globes and orbital soft tissues demonstrate no acute finding. Paranasal sinuses are largely clear. No significant mastoid effusion. Inner ear structures grossly normal. Other: None. MRA HEAD FINDINGS Examination mildly degraded by motion artifact. ANTERIOR CIRCULATION: Visualized distal cervical segments of the internal carotid arteries are patent with antegrade flow. Distal cervical right ICA tortuous. Petrous,  cavernous, and supraclinoid segments patent without appreciable stenosis or other abnormality. A1 segments patent. Normal anterior communicating artery complex. Anterior cerebral arteries patent to their distal aspects without stenosis. No M1 stenosis or occlusion. Normal MCA bifurcations. Distal MCA branches perfused and symmetric. Suspected diffuse small vessel atheromatous irregularity. POSTERIOR CIRCULATION: Right vertebral artery strongly  dominant and widely patent to the vertebrobasilar junction. Left vertebral artery hypoplastic and patent as well. Both PICA origins patent and normal. Basilar mildly irregular but patent to its distal aspect without stenosis. Superior cerebellar arteries patent bilaterally. Both PCAs primarily supplied via the basilar. PCAs mildly irregular but widely patent proximally, not well evaluated distally due to motion. Question severe stenosis at the distal left P2/P3 junction (series 1027, image 15). No intracranial aneurysm. IMPRESSION: MRI HEAD IMPRESSION: 1. No acute intracranial infarct or other abnormality. 2. Mild to moderate chronic microvascular ischemic disease, with a few scattered remote lacunar infarcts involving the deep white matter of the left frontal centrum semi ovale and cerebellum. MRA HEAD IMPRESSION: 1. Motion degraded exam. 2. Negative intracranial MRA for large vessel occlusion. 3. Question severe distal left P2/P3 junction stenosis. No other hemodynamically significant or correctable stenosis. 4. Suspected diffuse small vessel atheromatous irregularity. Electronically Signed   By: Jeannine Boga M.D.   On: 03/22/2021 23:40   MR BRAIN WO CONTRAST  Result Date: 03/22/2021 CLINICAL DATA:  Initial evaluation for neuro deficit, stroke suspected, left-sided facial droop and numbness. EXAM: MRI HEAD WITHOUT CONTRAST MRA HEAD WITHOUT CONTRAST TECHNIQUE: Multiplanar, multiecho pulse sequences of the brain and surrounding structures were obtained without intravenous contrast. Angiographic images of the head were obtained using MRA technique without contrast. COMPARISON:  Prior head CT from earlier the same day. FINDINGS: MRI HEAD FINDINGS Brain: Cerebral volume within normal limits for age. Mild scattered patchy T2/FLAIR hyperintensity involving the periventricular and deep white matter both cerebral hemispheres as well as the pons, most consistent with chronic small vessel ischemic disease, mild  to moderate in nature at the pons. Superimposed remote lacunar infarct present at the deep white matter of the left frontal centrum semi ovale. Few tiny remote bilateral cerebellar infarcts noted. No abnormal foci of restricted diffusion to suggest acute or subacute ischemia. Gray-white matter differentiation maintained. No encephalomalacia to suggest chronic cortical infarction. No foci of susceptibility artifact to suggest acute or chronic intracranial hemorrhage. No mass lesion, midline shift or mass effect. No hydrocephalus or extra-axial fluid collection. Pituitary gland suprasellar region within normal limits. Midline structures intact. Vascular: Major intracranial vascular flow voids are maintained. Skull and upper cervical spine: Craniocervical junction within normal limits. Bone marrow signal intensity normal. No focal marrow replacing lesion. No scalp soft tissue abnormality. Sinuses/Orbits: Patient status post bilateral ocular lens replacement. Globes and orbital soft tissues demonstrate no acute finding. Paranasal sinuses are largely clear. No significant mastoid effusion. Inner ear structures grossly normal. Other: None. MRA HEAD FINDINGS Examination mildly degraded by motion artifact. ANTERIOR CIRCULATION: Visualized distal cervical segments of the internal carotid arteries are patent with antegrade flow. Distal cervical right ICA tortuous. Petrous, cavernous, and supraclinoid segments patent without appreciable stenosis or other abnormality. A1 segments patent. Normal anterior communicating artery complex. Anterior cerebral arteries patent to their distal aspects without stenosis. No M1 stenosis or occlusion. Normal MCA bifurcations. Distal MCA branches perfused and symmetric. Suspected diffuse small vessel atheromatous irregularity. POSTERIOR CIRCULATION: Right vertebral artery strongly dominant and widely patent to the vertebrobasilar junction. Left vertebral artery hypoplastic and patent as well.  Both PICA origins patent  and normal. Basilar mildly irregular but patent to its distal aspect without stenosis. Superior cerebellar arteries patent bilaterally. Both PCAs primarily supplied via the basilar. PCAs mildly irregular but widely patent proximally, not well evaluated distally due to motion. Question severe stenosis at the distal left P2/P3 junction (series 1027, image 15). No intracranial aneurysm. IMPRESSION: MRI HEAD IMPRESSION: 1. No acute intracranial infarct or other abnormality. 2. Mild to moderate chronic microvascular ischemic disease, with a few scattered remote lacunar infarcts involving the deep white matter of the left frontal centrum semi ovale and cerebellum. MRA HEAD IMPRESSION: 1. Motion degraded exam. 2. Negative intracranial MRA for large vessel occlusion. 3. Question severe distal left P2/P3 junction stenosis. No other hemodynamically significant or correctable stenosis. 4. Suspected diffuse small vessel atheromatous irregularity. Electronically Signed   By: Jeannine Boga M.D.   On: 03/22/2021 23:40   OCT, Retina - OU - Both Eyes  Result Date: 03/12/2021 Right Eye Quality was good. Scan locations included subfoveal. Central Foveal Thickness: 389. Progression has improved. Left Eye Quality was good. Scan locations included subfoveal. Central Foveal Thickness: 299. Progression has been stable. Findings include normal foveal contour. Notes OD, improving macular thickness only 5 days post vitrectomy membrane peel for ERM OS normal  ECHOCARDIOGRAM COMPLETE BUBBLE STUDY  Result Date: 03/23/2021    ECHOCARDIOGRAM REPORT   Patient Name:   Caitlyn [name] Date of Exam: 03/23/2021 Medical Rec #:  [id number]   Height:       67.0 in Accession #:    [id number]  Weight:       260.0 lb Date of Birth:  [birth year]   BSA:          2.261 m Patient Age:    65 years    BP:           167/85 mmHg Patient Gender: F           HR:           86 bpm. Exam Location:  Inpatient Procedure: 2D Echo, Cardiac  Doppler, Color Doppler and Saline Contrast Bubble            Study Indications:    Stroke 434.91 / I63.9  History:        Patient has prior history of Echocardiogram examinations, most                 recent 09/01/2015. Risk Factors:Diabetes and Hypertension.                 Asthma.  Sonographer:    [place] Referring Phys: [id number] [place]  1. Left ventricular ejection fraction, by estimation, is 60 to 65%. The left ventricle has normal function. The left ventricle has no regional wall motion abnormalities. There is moderate concentric left ventricular hypertrophy. Left ventricular diastolic parameters are consistent with Grade I diastolic dysfunction (impaired relaxation). Elevated left ventricular end-diastolic pressure.  2. Right ventricular systolic function is normal. The right ventricular size is normal. There is normal pulmonary artery systolic pressure.  3. The mitral valve is normal in structure. Trivial mitral valve regurgitation. No evidence of mitral stenosis.  4. The aortic valve is tricuspid. Aortic valve regurgitation is not visualized. No aortic stenosis is present.  5. Aortic dilatation noted. There is mild dilatation of the ascending aorta, measuring 37 mm. There is mild dilatation of the aortic arch, measuring 41 mm.  6. The inferior vena cava is normal in size with greater than 50% respiratory variability, suggesting  right atrial pressure of 3 mmHg.  7. Agitated saline contrast bubble study was negative, with no evidence of any interatrial shunt. FINDINGS  Left Ventricle: Left ventricular ejection fraction, by estimation, is 60 to 65%. The left ventricle has normal function. The left ventricle has no regional wall motion abnormalities. The left ventricular internal cavity size was normal in size. There is  moderate concentric left ventricular hypertrophy. Left ventricular diastolic parameters are consistent with Grade I diastolic dysfunction (impaired relaxation).  Elevated left ventricular end-diastolic pressure. Right Ventricle: The right ventricular size is normal. No increase in right ventricular wall thickness. Right ventricular systolic function is normal. There is normal pulmonary artery systolic pressure. The tricuspid regurgitant velocity is 2.08 m/s, and  with an assumed right atrial pressure of 3 mmHg, the estimated right ventricular systolic pressure is 89.3 mmHg. Left Atrium: Left atrial size was normal in size. Right Atrium: Right atrial size was normal in size. Pericardium: There is no evidence of pericardial effusion. Mitral Valve: The mitral valve is normal in structure. Trivial mitral valve regurgitation. No evidence of mitral valve stenosis. Tricuspid Valve: The tricuspid valve is normal in structure. Tricuspid valve regurgitation is trivial. No evidence of tricuspid stenosis. Aortic Valve: The aortic valve is tricuspid. Aortic valve regurgitation is not visualized. No aortic stenosis is present. Aortic valve mean gradient measures 4.0 mmHg. Aortic valve peak gradient measures 7.4 mmHg. Aortic valve area, by VTI measures 3.24 cm. Pulmonic Valve: The pulmonic valve was normal in structure. Pulmonic valve regurgitation is not visualized. No evidence of pulmonic stenosis. Aorta: Aortic dilatation noted. There is mild dilatation of the ascending aorta, measuring 37 mm. There is mild dilatation of the aortic arch, measuring 41 mm. Venous: The inferior vena cava is normal in size with greater than 50% respiratory variability, suggesting right atrial pressure of 3 mmHg. IAS/Shunts: No atrial level shunt detected by color flow Doppler. Agitated saline contrast was given intravenously to evaluate for intracardiac shunting. Agitated saline contrast bubble study was negative, with no evidence of any interatrial shunt.  LEFT VENTRICLE PLAX 2D LVIDd:         4.10 cm     Diastology LVIDs:         2.80 cm     LV e' medial:    4.28 cm/s LV PW:         1.40 cm     LV E/e'  medial:  18.4 LV IVS:        1.30 cm     LV e' lateral:   5.33 cm/s LVOT diam:     2.30 cm     LV E/e' lateral: 14.8 LV SV:         97 LV SV Index:   43 LVOT Area:     4.15 cm  LV Volumes (MOD) LV vol d, MOD A2C: 41.9 ml LV vol d, MOD A4C: 69.9 ml LV vol s, MOD A2C: 16.4 ml LV vol s, MOD A4C: 21.2 ml LV SV MOD A2C:     25.5 ml LV SV MOD A4C:     69.9 ml LV SV MOD BP:      37.9 ml RIGHT VENTRICLE RV S prime:     17.90 cm/s TAPSE (M-mode): 2.1 cm LEFT ATRIUM             Index       RIGHT ATRIUM           Index LA Vol (A2C):   34.8 ml 15.39 ml/m  RA Area:     14.80 cm LA Vol (A4C):   46.0 ml 20.35 ml/m RA Volume:   36.40 ml  16.10 ml/m LA Biplane Vol: 41.4 ml 18.31 ml/m  AORTIC VALVE                   PULMONIC VALVE AV Area (Vmax):    2.98 cm    PV Vmax:       1.12 m/s AV Area (Vmean):   2.82 cm    PV Vmean:      78.550 cm/s AV Area (VTI):     3.24 cm    PV VTI:        0.248 m AV Vmax:           136.00 cm/s PV Peak grad:  5.1 mmHg AV Vmean:          99.900 cm/s PV Mean grad:  2.5 mmHg AV VTI:            0.299 m AV Peak Grad:      7.4 mmHg AV Mean Grad:      4.0 mmHg LVOT Vmax:         97.50 cm/s LVOT Vmean:        67.700 cm/s LVOT VTI:          0.233 m LVOT/AV VTI ratio: 0.78  AORTA Ao Root diam: 2.80 cm Ao Asc diam:  3.70 cm MITRAL VALVE               TRICUSPID VALVE MV Area (PHT): 2.96 cm    TR Peak grad:   17.3 mmHg MV Decel Time: 256 msec    TR Vmax:        208.00 cm/s MV E velocity: 78.80 cm/s MV A velocity: 95.50 cm/s  SHUNTS MV E/A ratio:  0.83        Systemic VTI:  0.23 m                            Systemic Diam: 2.30 cm Skeet Latch MD Electronically signed by Skeet Latch MD Signature Date/Time: 03/23/2021/2:48:09 PM    Final    VAS US CAROTID  Result Date: 03/23/2021 Carotid Arterial Duplex Study Patient Name:  Caitlyn [name]  Date of Exam:   03/23/2021 Medical Rec #: [id number]    Accession #:    [id number] Date of Birth: July 20, 1956    Patient Gender: F Patient Age:   89S Exam Location:   [facility] Procedure:      VAS US CAROTID Referring Phys: [id number] [place] --------------------------------------------------------------------------------  Indications:       Stroke. Risk Factors:      Hypertension, Diabetes. Comparison Study:  03-22-2021 MRA head showed hypoplastic but patent LT                    vertebral artery. Performing Technologist: Darlin Coco RDMS,RVT  Examination Guidelines: A complete evaluation includes B-mode imaging, spectral Doppler, color Doppler, and power Doppler as needed of all accessible portions of each vessel. Bilateral testing is considered an integral part of a complete examination. Limited examinations for reoccurring indications may be performed as noted.  Right Carotid Findings: +----------+--------+--------+--------+------------------+--------+           PSV cm/sEDV cm/sStenosisPlaque DescriptionComments +----------+--------+--------+--------+------------------+--------+ CCA Prox  106     17                                         +----------+--------+--------+--------+------------------+--------+  CCA Distal75      17              heterogenous               +----------+--------+--------+--------+------------------+--------+ ICA Prox  56      21      1-39%                              +----------+--------+--------+--------+------------------+--------+ ICA Distal68      20                                tortuous +----------+--------+--------+--------+------------------+--------+ ECA       226     28      >50%    heterogenous               +----------+--------+--------+--------+------------------+--------+ +----------+--------+-------+----------------+-------------------+           PSV cm/sEDV cmsDescribe        Arm Pressure (mmHG) +----------+--------+-------+----------------+-------------------+ Subclavian127            Multiphasic, WNL                     +----------+--------+-------+----------------+-------------------+ +---------+--------+--+--------+--+---------+ VertebralPSV cm/s67EDV cm/s23Antegrade +---------+--------+--+--------+--+---------+  Left Carotid Findings: +----------+--------+--------+--------+------------------+--------+           PSV cm/sEDV cm/sStenosisPlaque DescriptionComments +----------+--------+--------+--------+------------------+--------+ CCA Prox  112     22                                         +----------+--------+--------+--------+------------------+--------+ CCA Distal63      16                                         +----------+--------+--------+--------+------------------+--------+ ICA Prox  52      18                                         +----------+--------+--------+--------+------------------+--------+ ICA Distal138     48                                tortuous +----------+--------+--------+--------+------------------+--------+ ECA       84      8                                          +----------+--------+--------+--------+------------------+--------+ +----------+--------+--------+----------------+-------------------+           PSV cm/sEDV cm/sDescribe        Arm Pressure (mmHG) +----------+--------+--------+----------------+-------------------+ [id number]             Multiphasic, WNL                    +----------+--------+--------+----------------+-------------------+ +---------+--------+--------+--------------+ VertebralPSV cm/sEDV cm/sNot identified +---------+--------+--------+--------------+   Summary: Right Carotid: Velocities in the right ICA are consistent with a 1-39% stenosis.                The ECA appears >50% stenosed. Left Carotid: The extracranial vessels  were near-normal with only minimal wall               thickening or plaque. Vertebrals:  Right vertebral artery demonstrates antegrade flow. Left vertebral              artery was not  visualized. Subclavians: Normal flow hemodynamics were seen in bilateral subclavian              arteries. *See table(s) above for measurements and observations.  Electronically signed by Antony Contras MD on 03/23/2021 at 1:13:31 PM.    Final     Microbiology: Recent Results (from the past 240 hour(s))  Resp Panel by RT-PCR (Flu A&B, Covid) Nasopharyngeal Swab     Status: None   Collection Time: 03/22/21  7:20 PM   Specimen: Nasopharyngeal Swab; Nasopharyngeal(NP) swabs in vial transport medium  Result Value Ref Range Status   SARS Coronavirus 2 by RT PCR NEGATIVE NEGATIVE Final    Comment: (NOTE) SARS-CoV-2 target nucleic acids are NOT DETECTED.  The SARS-CoV-2 RNA is generally detectable in upper respiratory specimens during the acute phase of infection. The lowest concentration of SARS-CoV-2 viral copies this assay can detect is 138 copies/mL. A negative result does not preclude SARS-Cov-2 infection and should not be used as the sole basis for treatment or other patient management decisions. A negative result may occur with  improper specimen collection/handling, submission of specimen other than nasopharyngeal swab, presence of viral mutation(s) within the areas targeted by this assay, and inadequate number of viral copies(<138 copies/mL). A negative result must be combined with clinical observations, patient history, and epidemiological information. The expected result is Negative.  Fact Sheet for Patients:  EntrepreneurPulse.com.au  Fact Sheet for Healthcare Providers:  IncredibleEmployment.be  This test is no t yet approved or cleared by the [place] FDA and  has been authorized for detection and/or diagnosis of SARS-CoV-2 by FDA under an Emergency Use Authorization (EUA). This EUA will remain  in effect (meaning this test can be used) for the duration of the COVID-19 declaration under Section 564(b)(1) of the Act, 21 U.S.C.section  360bbb-3(b)(1), unless the authorization is terminated  or revoked sooner.       Influenza A by PCR NEGATIVE NEGATIVE Final   Influenza B by PCR NEGATIVE NEGATIVE Final    Comment: (NOTE) The Xpert Xpress SARS-CoV-2/FLU/RSV plus assay is intended as an aid in the diagnosis of influenza from Nasopharyngeal swab specimens and should not be used as a sole basis for treatment. Nasal washings and aspirates are unacceptable for Xpert Xpress SARS-CoV-2/FLU/RSV testing.  Fact Sheet for Patients: EntrepreneurPulse.com.au  Fact Sheet for Healthcare Providers: IncredibleEmployment.be  This test is not yet approved or cleared by the [place] FDA and has been authorized for detection and/or diagnosis of SARS-CoV-2 by FDA under an Emergency Use Authorization (EUA). This EUA will remain in effect (meaning this test can be used) for the duration of the COVID-19 declaration under Section 564(b)(1) of the Act, 21 U.S.C. section 360bbb-3(b)(1), unless the authorization is terminated or revoked.  Performed at [facility], [place] [address], [place], [place] [address]      Labs: Basic Metabolic Panel: Recent Labs  Lab 03/22/21 1600  NA 136  K 4.7  CL 102  CO2 26  GLUCOSE 152*  BUN 17  CREATININE 1.06*  CALCIUM 9.1   Liver Function Tests: No results for input(s): AST, ALT, ALKPHOS, BILITOT, PROT, ALBUMIN in the last 168 hours. No results for input(s): LIPASE, AMYLASE in  the last 168 hours. No results for input(s): AMMONIA in the last 168 hours. CBC: Recent Labs  Lab 03/22/21 1600  WBC 8.2  NEUTROABS 5.5  HGB 13.1  HCT 41.0  MCV 91.7  PLT 369   Cardiac Enzymes: No results for input(s): CKTOTAL, CKMB, CKMBINDEX, TROPONINI in the last 168 hours. BNP: BNP (last 3 results) No results for input(s): BNP in the last 8760 hours.  ProBNP (last 3 results) No results for input(s): PROBNP in the last 8760 hours.  CBG: Recent Labs  Lab  03/22/21 2352 03/23/21 0514 03/23/21 0633 03/23/21 0700 03/23/21 1208  GLUCAP 124* 133* 142* 140* 139*       Signed:  Ranjit Ashurst  [facility] 03/23/2021, 3:21 PM

## 2021-03-23 NOTE — Plan of Care (Signed)
  Problem: Education: Goal: Knowledge of disease or condition will improve Outcome: Progressing Goal: Knowledge of secondary prevention will improve Outcome: Progressing Goal: Knowledge of patient specific risk factors addressed and post discharge goals established will improve Outcome: Progressing Goal: Individualized Educational Video(s) Outcome: Progressing   Problem: Coping: Goal: Will verbalize positive feelings about self Outcome: Progressing   Problem: Nutrition: Goal: Risk of aspiration will decrease Outcome: Progressing   Problem: Intracerebral Hemorrhage Tissue Perfusion: Goal: Complications of Intracerebral Hemorrhage will be minimized Outcome: Progressing   Problem: Ischemic Stroke/TIA Tissue Perfusion: Goal: Complications of ischemic stroke/TIA will be minimized Outcome: Progressing

## 2021-03-23 NOTE — Evaluation (Signed)
Physical Therapy Evaluation & Discharge Patient Details Name: Caitlyn [name] MRN: [id number] DOB: [birth year] Today's Date: 03/23/2021   History of Present Illness  65 y/o female presented to ED on 4/28 for R sided facial droop and numbness and R hand numbness this date and L sided facial droop and numbness on 4/27. MRI negative. PMH: type 2 DM, HTN, asthma, depression  Clinical Impression  PTA, patient lives with husband and independent with mobility, driving, and working full time. Patient currently functioning at independent level. Patient reports feeling back to baseline. No further skilled PT needs required during acute stay. No PT follow up recommended at this time.      Follow Up Recommendations No PT follow up    Equipment Recommendations  None recommended by PT    Recommendations for Other Services       Precautions / Restrictions Precautions Precautions: None Restrictions Weight Bearing Restrictions: No      Mobility  Bed Mobility Overal bed mobility: Independent                  Transfers Overall transfer level: Independent                  Ambulation/Gait Ambulation/Gait assistance: Independent Gait Distance (Feet): 200 Feet   Gait Pattern/deviations: WFL(Within Functional Limits)   Gait velocity interpretation: >4.37 ft/sec, indicative of normal walking speed    Stairs Stairs: Yes Stairs assistance: Independent Stair Management: One rail Right;Step to pattern;Forwards Number of Stairs: 6    Wheelchair Mobility    Modified Rankin (Stroke Patients Only)       Balance Overall balance assessment: No apparent balance deficits (not formally assessed)                                           Pertinent Vitals/Pain Pain Assessment: No/denies pain    Home Living Family/patient expects to be discharged to:: Private residence Living Arrangements: Spouse/significant other Available Help at Discharge: Family;Available  24 hours/day Type of Home: House Home Access: Stairs to enter Entrance Stairs-Rails: Right;Left;Can reach both Entrance Stairs-Number of Steps: 6 Home Layout: One level Home Equipment: Earleen Aoun - 2 wheels;Cane - single point;Bedside commode;Shower seat - built in      Prior Function Level of Independence: Independent         Comments: drives, working, independent with ADLs     Hand Dominance        Extremity/Trunk Assessment   Upper Extremity Assessment Upper Extremity Assessment: Overall WFL for tasks assessed    Lower Extremity Assessment Lower Extremity Assessment: Overall WFL for tasks assessed    Cervical / Trunk Assessment Cervical / Trunk Assessment: Normal  Communication   Communication: No difficulties  Cognition Arousal/Alertness: Awake/alert Behavior During Therapy: WFL for tasks assessed/performed Overall Cognitive Status: Within Functional Limits for tasks assessed                                        General Comments      Exercises     Assessment/Plan    PT Assessment Patent does not need any further PT services  PT Problem List         PT Treatment Interventions      PT Goals (Current goals can be found in the Care Plan section)  Acute Rehab PT Goals Patient Stated Goal: to go home PT Goal Formulation: With patient    Frequency     Barriers to discharge        Co-evaluation               AM-PAC PT "6 Clicks" Mobility  Outcome Measure Help needed turning from your back to your side while in a flat bed without using bedrails?: None Help needed moving from lying on your back to sitting on the side of a flat bed without using bedrails?: None Help needed moving to and from a bed to a chair (including a wheelchair)?: None Help needed standing up from a chair using your arms (e.g., wheelchair or bedside chair)?: None Help needed to walk in hospital room?: None Help needed climbing 3-5 steps with a railing? :  None 6 Click Score: 24    End of Session   Activity Tolerance: Patient tolerated treatment well Patient left: in bed;with call bell/phone within reach;with family/visitor present Nurse Communication: Mobility status PT Visit Diagnosis: Muscle weakness (generalized) (M62.81)    Time: 5277-8242 PT Time Calculation (min) (ACUTE ONLY): 16 min   Charges:   PT Evaluation $PT Eval Low Complexity: 1 Low          Zamir Staples A. Gilford Rile PT, DPT Acute Rehabilitation Services Pager [phone] Office [phone]   Linna Hoff 03/23/2021, 9:35 AM

## 2021-03-23 NOTE — Progress Notes (Signed)
OT Cancellation Note and Discharge  Patient Details Name: CAMIAH HUMM MRN: [id number] DOB: 1956-03-19   Cancelled Treatment:    Reason Eval/Treat Not Completed: OT screened, no needs identified, will sign off. Pt reports she is back to her baseline without any issues with RUE at this time and was independent with PT per their note and speaking with them,  Golden Circle, OTR/L Acute Rehab Services Pager [phone] Office [phone]     Almon Register 03/23/2021, 11:33 AM

## 2021-03-26 ENCOUNTER — Telehealth: Payer: Self-pay

## 2021-03-26 NOTE — Telephone Encounter (Signed)
Transition Care Management Unsuccessful Follow-up Telephone Call  Date of discharge and from where:  03/23/2021 from [facility]  Attempts:  1st Attempt  Reason for unsuccessful TCM follow-up call:  Left voice message

## 2021-03-27 NOTE — Telephone Encounter (Signed)
Transition Care Management Follow-up Telephone Call  Date of discharge and from where: 03/23/2021 from [facility]  How have you been since you were released from the hospital? Pt stated that she is feeling good and didn't have any questions or concerns.   Any questions or concerns? No  Items Reviewed:  Did the pt receive and understand the discharge instructions provided? Yes   Medications obtained and verified? Yes   Other? No   Any new allergies since your discharge? No   Dietary orders reviewed? heart healthy/carb modified  Do you have support at home? Yes   Functional Questionnaire: (I = Independent and D = Dependent) ADLs: I  Bathing/Dressing- I  Meal Prep- I  Eating- I  Maintaining continence- I  Transferring/Ambulation- I  Managing Meds- I   Follow up appointments reviewed:   PCP Hospital f/u appt confirmed? Yes  Scheduled to see Jenna Luo, MD on 03/30/2021 @ 2:15pm.  [facility] f/u appt confirmed? No  Pt stated that she will call Neurology.   Are transportation arrangements needed? No   If their condition worsens, is the pt aware to call PCP or go to the Emergency Dept.? Yes  Was the patient provided with contact information for the PCP's office or ED? Yes  Was to pt encouraged to call back with questions or concerns? Yes

## 2021-03-30 ENCOUNTER — Other Ambulatory Visit: Payer: Self-pay

## 2021-03-30 ENCOUNTER — Encounter: Payer: Self-pay | Admitting: Family Medicine

## 2021-03-30 ENCOUNTER — Ambulatory Visit: Payer: BC Managed Care – PPO | Admitting: Family Medicine

## 2021-03-30 VITALS — BP 132/64 | HR 100 | Temp 98.0°F | Resp 16 | Ht 67.0 in | Wt 253.0 lb

## 2021-03-30 DIAGNOSIS — E7800 Pure hypercholesterolemia, unspecified: Secondary | ICD-10-CM

## 2021-03-30 DIAGNOSIS — E1169 Type 2 diabetes mellitus with other specified complication: Secondary | ICD-10-CM

## 2021-03-30 DIAGNOSIS — E1159 Type 2 diabetes mellitus with other circulatory complications: Secondary | ICD-10-CM | POA: Diagnosis not present

## 2021-03-30 DIAGNOSIS — G43009 Migraine without aura, not intractable, without status migrainosus: Secondary | ICD-10-CM

## 2021-03-30 DIAGNOSIS — I152 Hypertension secondary to endocrine disorders: Secondary | ICD-10-CM

## 2021-03-30 NOTE — Progress Notes (Signed)
Subjective:    Patient ID: Caitlyn [name], female    DOB: Dec 04, 1955, 65 y.o.   MRN: [id number]  HPI Admit date: 03/22/2021 Discharge date: 03/23/2021  Time spent: 45 minutes  Recommendations for Outpatient Follow-up:  Patient will be discharged to home.  Patient will need to follow up with primary care provider within one week of discharge.  Follow up with neurology. Patient should continue medications as prescribed.  Patient should follow a heart healthy/carb modified diet.    Discharge Diagnoses:  Transient facial droop with hand numbness and weakness, likely TIA, CVA ruled out Diabetes mellitus, type II Essential hypertension Asthma Depression  Discharge Condition: stable  Diet recommendation: heart healthy/carb modified     Filed Weights   03/22/21 1802  Weight: 117.9 kg    History of present illness:  On 03/22/2021 by Dr. Sheppard Penton Fulkis a 65 y.o.femalewith medical history significant fortype 2 diabetes, hypertension, asthma, depression who presents to the ED for evaluation of transient facial droop, hand weakness.  Patient states yesterday (4/27) she had new onset of left hand weakness and left facial droop with numbness at both sites. Symptoms resolved after about 20 minutes. She says that she was working on her computer and became confused at what she was looking at on the computer which was unusual for her.  Today around 11:30 AM she developed right hand weakness/numbness, right facial droop/numbness, and apparent slurred speech observed by her husband. She also noted a headache at her left temple today which occurred around the same time. She did have a headache yesterday but did not pay attention to the localization. She has had some nausea without emesis. She otherwise denies any chest pain, palpitations, dyspnea, cough, abdominal pain, dysuria, or diarrhea.  She denies any tobacco, alcohol, or illicit drug use. She reports a history  of stroke in her father and diabetes in her brother.  Hospital Course:  Transient facial droop with hand numbness and weakness, likely TIA, CVA ruled out -Patient states she had an episode with hand weakness and numbness along with headache on 4/27 and 4/28.  Symptoms resolved within 20 minutes -CT head showed no acute intercranial process -MRI brain showed no acute cranial infarct or other abnormality.  Mild to moderate chronic microvascular ischemic disease with a few scattered remote lacunar infarcts involving deep white matter of the left frontal centrum semi ovale and cerebellum -MRA head negative for intracranial MRA for large vessel occlusion.  Question severe distal left P2/P3 junction stenosis.  Suspected diffuse small vessel atheromatous irregularity -Carotid Doppler: Right ICA 1 to 39% stenosis.  Extracranial vessels were near normal with only minimal wall thickening or plaque.  Right vertebral artery demonstrates antegrade flow.  Left vertebral artery not visualized.  Normal flow hemodynamics seen in bilateral subclavian arteries -Echocardiogram with bubble study showed EF of 60 to 65%, no regional wall motion abnormalities.  Moderate LVH.  Grade 1 diastolic dysfunction.  Trivial mitral valve regurgitation.  Mild dilatation of the ascending aorta measuring 37 mm, mild dilatation of the aortic arch measuring 41 mm.  Agitated saline contrast bubble study was negative, no evidence of intra-arterial shunt. (Given dilatation of aortic arch, would recommend patient have follow-up with her PCP for surveillance) -A1c 6.7, LDL 101 -PT evaluated patient, no follow-up recommended -Speech therapy evaluated patient, no follow-up recommended -Neurology consulted and appreciated-recommended continuing aspirin and Plavix for 3 weeks followed by aspirin alone.  Feels this may also be atypical migraine, patient placed on Topamax. -  Currently on aspirin and Plavix, statin  Diabetes mellitus, type  II -Metformin held, can resume on discharge -[place] A1c 6.7  Essential hypertension -Continue metoprolol, amlodipine  Asthma -Stable, continue Singulair and albuterol  Depression -Continue Effexor  Procedures: Echocardiogram Carotid Doppler  Consultations: Neurology  03/30/21 Patient is here today for hospital follow-up.  She feels extremely tired and sluggish on the Topamax.  She is having a difficult time concentrating.  She feels dizzy every time she stands up and walks.  She states that the original attack included numbness and tingling in her left hand and on the left side of her face.  She had a severe pulsatile headache at the exact same time on the right side of her head.  She went home from work.  The next day the headache was persistent.  This time she developed numbness and tingling and weakness in her right hand and on the right side of her face.  Her husband stated that she was slurring her speech and had facial droop.  They took her immediately to the hospital.  MRI showed chronic microvascular changes and remote lacunar infarcts.  She had a 1 to 39% right carotid artery stenosis.  However there was no significant vascular occlusions.  Thought was that the patient may be having atypical migraines.  She states that the headaches persisted until he started on Topamax.  The headaches have improved dramatically since starting Topamax however she feels extremely "drunk" on Topamax.  She is taking 50 mg twice a day Past Medical History:  Diagnosis Date  . Asthma   . Cold and cough --- no fever  . Complication of anesthesia    24 yrs ago post tubal-- pulm. edema  (no problems post bowel surg. 2009)  . Depression   . Diabetes mellitus    oral med  . Diabetes mellitus without complication ([place])    [employer]reesia 11/28/2020  . Diverticulitis hx  -- w/ perf. bowel   s/p resection 2009  . Hypertension   . Psoriasis   . Reflux occasional   watches diet   Past Surgical History:   Procedure Laterality Date  . BOWEL RESECTION  2009   diverticulitis w/ perf. bowel  . COLON SURGERY N/A    [employer] 11/28/2020  . HYSTEROSCOPY  10/09/2011   Procedure: HYSTEROSCOPY;  Surgeon: Margarette Asal;  Location: [place];  Service: Gynecology;;  HYSTEROSCOPY D & C  . TUBAL LIGATION  24 yrs ago   Current Outpatient Medications on File Prior to Visit  Medication Sig Dispense Refill  . ACCU-CHEK FASTCLIX LANCETS MISC Tes bid 100 each 2  . albuterol (VENTOLIN HFA) 108 (90 Base) MCG/ACT inhaler Inhale 2 puffs into the lungs every 4 (four) hours as needed for wheezing or shortness of breath. 8 g 2  . amLODipine (NORVASC) 10 MG tablet TAKE 1 TABLET(10 MG) BY MOUTH DAILY 90 tablet 3  . aspirin 81 MG chewable tablet Chew 1 tablet (81 mg total) by mouth daily.    Marland Kitchen atorvastatin (LIPITOR) 10 MG tablet Take 2 tablets (20 mg total) by mouth daily. 30 tablet 1  . blood glucose meter kit and supplies Dispense based on patient and insurance preference. Check BS BID. (FOR ICD-10 E11.9). 1 each 0  . clopidogrel (PLAVIX) 75 MG tablet Take 1 tablet (75 mg total) by mouth daily. 21 tablet 0  . COSENTYX SENSOREADY 300 DOSE 150 MG/ML SOAJ Inject 150 mg into the muscle every 30 (thirty) days.    Marland Kitchen  fexofenadine (ALLEGRA) 180 MG tablet Take 180 mg by mouth at bedtime.    . metFORMIN (GLUCOPHAGE) 1000 MG tablet Take 1 tablet (1,000 mg total) by mouth 2 (two) times daily with a meal. 180 tablet 3  . metoprolol succinate (TOPROL-XL) 25 MG 24 hr tablet Take 1 tablet (25 mg total) by mouth daily. 90 tablet 3  . montelukast (SINGULAIR) 10 MG tablet TAKE 1 TABLET(10 MG) BY MOUTH AT BEDTIME 90 tablet 3  . topiramate (TOPAMAX) 50 MG tablet Take 1 tablet (50 mg total) by mouth 2 (two) times daily. 60 tablet 0  . venlafaxine XR (EFFEXOR-XR) 150 MG 24 hr capsule TAKE 1 CAPSULE BY MOUTH EVERY DAY 90 capsule 3   No current facility-administered medications on file prior to visit.   Allergies   Allergen Reactions  . Erythromycin Other (See Comments)    Gi upset/ cramps  . Hctz [Hydrochlorothiazide]     CAUSED GOUT  . Macrolides And Ketolides Other (See Comments)    unknown   Social History   Socioeconomic History  . Marital status: Married    Spouse name: Not on file  . Number of children: Not on file  . Years of education: Not on file  . Highest education level: Not on file  Occupational History  . Occupation: Optometrist  Tobacco Use  . Smoking status: Never Smoker  . Smokeless tobacco: Never Used  Substance and Sexual Activity  . Alcohol use: No  . Drug use: No  . Sexual activity: Not on file  Other Topics Concern  . Not on file  Social History Narrative   Lives with her husband   Social Determinants of Health   Financial Resource Strain: Not on file  Food Insecurity: Not on file  Transportation Needs: Not on file  Physical Activity: Not on file  Stress: Not on file  Social Connections: Not on file  Intimate Partner Violence: Not on file      Review of Systems  All other systems reviewed and are negative.      Objective:   Physical Exam Vitals reviewed.  Constitutional:      General: She is not in acute distress.    Appearance: She is well-developed. She is not diaphoretic.  HENT:     Right Ear: External ear normal.     Left Ear: External ear normal.     Nose: Nose normal.     Mouth/Throat:     Pharynx: No oropharyngeal exudate.  Eyes:     Conjunctiva/sclera: Conjunctivae normal.  Neck:     Thyroid: No thyromegaly.     Vascular: No JVD.  Cardiovascular:     Rate and Rhythm: Normal rate and regular rhythm.     Heart sounds: Normal heart sounds. No murmur heard.   Pulmonary:     Effort: Pulmonary effort is normal. No respiratory distress.     Breath sounds: Normal breath sounds. No wheezing or rales.  Abdominal:     General: Bowel sounds are normal. There is no distension.     Palpations: Abdomen is soft.     Tenderness: There is  no abdominal tenderness. There is no guarding or rebound.  Musculoskeletal:     Cervical back: Neck supple.  Lymphadenopathy:     Cervical: No cervical adenopathy.           Assessment & Plan:  Type 2 diabetes mellitus with other specified complication, unspecified whether long term insulin use ([place])  Hypertension associated with diabetes ([place])  Pure  hypercholesterolemia  Atypical migraine  I believe the patient was having atypical migraines brought on by stress.  I recommended that she continue the Topamax as this is certainly seem to help the headaches.  I anticipate that the dizziness and disequilibrium will improve as her body adjusts to the 50 mg twice a day.  I have recommended that she stay out of work for the next 2 weeks to give her body a chance to recover and adjust to the new medication.  She will continue dual antiplatelet therapy in case these were TIAs for the next 3 weeks to complete 1 month total and then switch to aspirin 81 mg a day.  We discussed her remote lacunar infarcts and I explained to the patient that the best way to prevent this from reoccurring is to manage her risk factors.  Therefore I would like to keep her blood pressure in the 780Q or less systolic.  Diastolic blood pressure is well controlled.  Hemoglobin A1c was acceptable at 6.7.  I want to keep her LDL cholesterol less than 70.  She was just started on Lipitor so we will maintain that.  Reassess in 2 weeks.

## 2021-04-02 ENCOUNTER — Telehealth: Payer: Self-pay | Admitting: Family Medicine

## 2021-04-02 NOTE — Telephone Encounter (Signed)
FMLA paperwork submitted by patient's spouse Joneen Boers. The $20 fee was paid and the paperwork was placed in the hanging folder at [place] desk for completion.

## 2021-04-04 NOTE — Telephone Encounter (Signed)
Patient returned call.   Reason FMLA requested: possible TIA, medication changes  Requested Beginning Date: 04/02/2021- 04/15/2021  Return to Work Date: 04/16/2021.  Verbalized that fee may be charged and is per provider prerogative. Patient spouse reports that he has already paid fee.   Forms routed to provider.

## 2021-04-04 NOTE — Telephone Encounter (Signed)
ReceivedFMLA forms.   Call placed to patient for more information. [place].

## 2021-04-12 DIAGNOSIS — G4730 Sleep apnea, unspecified: Secondary | ICD-10-CM | POA: Diagnosis not present

## 2021-04-13 ENCOUNTER — Ambulatory Visit (INDEPENDENT_AMBULATORY_CARE_PROVIDER_SITE_OTHER): Payer: BC Managed Care – PPO | Admitting: Family Medicine

## 2021-04-13 ENCOUNTER — Other Ambulatory Visit: Payer: Self-pay

## 2021-04-13 ENCOUNTER — Encounter: Payer: Self-pay | Admitting: Family Medicine

## 2021-04-13 VITALS — BP 124/68 | HR 94 | Temp 97.9°F | Resp 16 | Ht 67.0 in | Wt 252.0 lb

## 2021-04-13 DIAGNOSIS — F439 Reaction to severe stress, unspecified: Secondary | ICD-10-CM

## 2021-04-13 MED ORDER — TOPIRAMATE 50 MG PO TABS: 50.0000 mg | ORAL_TABLET | Freq: Two times a day (BID) | ORAL | 3 refills | Status: DC

## 2021-04-13 MED ORDER — CLONAZEPAM 0.5 MG PO TABS: 0.5000 mg | ORAL_TABLET | Freq: Two times a day (BID) | ORAL | 1 refills | Status: DC | PRN
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
## 2021-05-03 DIAGNOSIS — L400 Psoriasis vulgaris: Secondary | ICD-10-CM | POA: Diagnosis not present

## 2021-05-03 DIAGNOSIS — Z79899 Other long term (current) drug therapy: Secondary | ICD-10-CM | POA: Diagnosis not present

## 2021-05-07 ENCOUNTER — Inpatient Hospital Stay: Payer: BC Managed Care – PPO | Admitting: Adult Health

## 2021-05-07 ENCOUNTER — Telehealth: Payer: Self-pay | Admitting: Adult Health

## 2021-05-07 NOTE — Telephone Encounter (Signed)
Patient had an appointment this afternoon with Frann Rider as a hospital follow-up. I called to reschedule her and offered her the next available which is July 19 and she said that this is a 4-week follow-up from when she was in the hospital and the 4 week mark was technically last week and she needs to be seen sooner. Dr. Leonie Man also didn't have anything available this week. I told her I was going to cancel the appointment for today and a nurse would give her a call back.

## 2021-05-07 NOTE — Telephone Encounter (Signed)
Caitlyn [name] was not able to get this patient in before the next available open slot, I sent her a mychart message explaining this.

## 2021-05-08 ENCOUNTER — Other Ambulatory Visit: Payer: Self-pay

## 2021-05-08 ENCOUNTER — Encounter (INDEPENDENT_AMBULATORY_CARE_PROVIDER_SITE_OTHER): Payer: Self-pay | Admitting: Ophthalmology

## 2021-05-08 ENCOUNTER — Ambulatory Visit (INDEPENDENT_AMBULATORY_CARE_PROVIDER_SITE_OTHER): Payer: BC Managed Care – PPO | Admitting: Ophthalmology

## 2021-05-08 ENCOUNTER — Encounter (INDEPENDENT_AMBULATORY_CARE_PROVIDER_SITE_OTHER): Payer: BC Managed Care – PPO | Admitting: Ophthalmology

## 2021-05-08 DIAGNOSIS — H35371 Puckering of macula, right eye: Secondary | ICD-10-CM | POA: Diagnosis not present

## 2021-05-08 NOTE — Progress Notes (Signed)
05/08/2021     CHIEF COMPLAINT Patient presents for Post-op Follow-up (8 week PO OD Sx on 03/07/2021/Pt states, "I am having a very hard time with my vision still. I am not having any FOL or Floaters. Nothing but very blurred vision in my right eye."/LBS: has not checked it in two weeks/A1C: under 7.0)   HISTORY OF PRESENT ILLNESS: Caitlyn [name] is a 65 y.o. female who presents to the clinic today for:   HPI     Post-op Follow-up           Laterality: right eye   Discomfort: none.  Negative for pain   Vision: is blurred at distance and is blurred at near   Comments: 8 week PO OD Sx on 03/07/2021 Pt states, "I am having a very hard time with my vision still. I am not having any FOL or Floaters. Nothing but very blurred vision in my right eye." LBS: has not checked it in two weeks A1C: under 7.0       Last edited by Kendra Opitz, COA on 05/08/2021  2:26 PM.      Referring physician: Susy Frizzle, MD [address] [place],  [place] [address]  HISTORICAL INFORMATION:   Selected notes from the MEDICAL RECORD NUMBER    Lab Results  Component Value Date   HGBA1C 6.7 (H) 03/22/2021     CURRENT MEDICATIONS: No current outpatient medications on file. (Ophthalmic Drugs)   No current facility-administered medications for this visit. (Ophthalmic Drugs)   Current Outpatient Medications (Other)  Medication Sig   albuterol (VENTOLIN HFA) 108 (90 Base) MCG/ACT inhaler Inhale 2 puffs into the lungs every 4 (four) hours as needed for wheezing or shortness of breath.   amLODipine (NORVASC) 10 MG tablet TAKE 1 TABLET(10 MG) BY MOUTH DAILY   aspirin 81 MG chewable tablet Chew 1 tablet (81 mg total) by mouth daily.   atorvastatin (LIPITOR) 10 MG tablet Take 2 tablets (20 mg total) by mouth daily.   clonazePAM (KLONOPIN) 0.5 MG tablet Take 1 tablet (0.5 mg total) by mouth 2 (two) times daily as needed for anxiety.   clopidogrel (PLAVIX) 75 MG tablet Take 1 tablet (75 mg  total) by mouth daily.   COSENTYX SENSOREADY 300 DOSE 150 MG/ML SOAJ Inject 150 mg into the muscle every 30 (thirty) days.   fexofenadine (ALLEGRA) 180 MG tablet Take 180 mg by mouth at bedtime.   metFORMIN (GLUCOPHAGE) 1000 MG tablet Take 1 tablet (1,000 mg total) by mouth 2 (two) times daily with a meal.   metoprolol succinate (TOPROL-XL) 25 MG 24 hr tablet Take 1 tablet (25 mg total) by mouth daily.   montelukast (SINGULAIR) 10 MG tablet TAKE 1 TABLET(10 MG) BY MOUTH AT BEDTIME   topiramate (TOPAMAX) 50 MG tablet Take 1 tablet (50 mg total) by mouth 2 (two) times daily.   venlafaxine XR (EFFEXOR-XR) 150 MG 24 hr capsule TAKE 1 CAPSULE BY MOUTH EVERY DAY   No current facility-administered medications for this visit. (Other)      REVIEW OF SYSTEMS:    ALLERGIES Allergies  Allergen Reactions   Erythromycin Other (See Comments)    Gi upset/ cramps   Hctz [Hydrochlorothiazide]     CAUSED GOUT   Macrolides And Ketolides Other (See Comments)    unknown    PAST MEDICAL HISTORY Past Medical History:  Diagnosis Date   Asthma    Cold and cough --- no fever   Complication of  anesthesia    24 yrs ago post tubal-- pulm. edema  (no problems post bowel surg. 2009)   Depression    Diabetes mellitus    oral med   Diabetes mellitus without complication ([place])    Phreesia 11/28/2020   Diverticulitis hx  -- w/ perf. bowel   s/p resection 2009   Hypertension    Psoriasis    Reflux occasional   watches diet   Past Surgical History:  Procedure Laterality Date   BOWEL RESECTION  2009   diverticulitis w/ perf. bowel   COLON SURGERY N/A    [facility]reesia 11/28/2020   HYSTEROSCOPY  10/09/2011   Procedure: HYSTEROSCOPY;  Surgeon: Margarette Asal;  Location: [facility];  Service: Gynecology;;  HYSTEROSCOPY D & C   TUBAL LIGATION  24 yrs ago    FAMILY HISTORY Family History  Problem Relation Age of Onset   Stroke Father    Diabetes Brother     SOCIAL HISTORY Social  History   Tobacco Use   Smoking status: Never   Smokeless tobacco: Never  Substance Use Topics   Alcohol use: No   Drug use: No         OPHTHALMIC EXAM:  Base Eye Exam     Visual Acuity (ETDRS)       Right Left   Dist [place] 20/80 20/40 -2   Dist ph [place] 20/25 20/25 +2         Tonometry (Tonopen, 2:31 PM)       Right Left   Pressure 11 11         Pupils       Pupils Dark Light Shape React APD   Right PERRL 4 3 Round Brisk None   Left PERRL 4 3 Round Brisk None         Visual Fields (Counting fingers)       Left Right    Full Full         Extraocular Movement       Right Left    Full Full         Neuro/Psych     Oriented x3: Yes   Mood/Affect: Normal         Dilation     Right eye: 1.0% Mydriacyl, 2.5% Phenylephrine @ 2:32 PM           Slit Lamp and Fundus Exam     External Exam       Right Left   External Normal Normal         Slit Lamp Exam       Right Left   Lids/Lashes Normal Normal   Conjunctiva/Sclera White and quiet White and quiet   Cornea Clear Clear   Anterior Chamber Deep and quiet Deep and quiet   Iris Round and reactive Round and reactive   Lens Centered posterior chamber intraocular lens Centered posterior chamber intraocular lens   Anterior Vitreous Normal Normal         Fundus Exam       Right Left   Posterior Vitreous Normal, clear and avitric    Disc Normal    C/D Ratio 0.45    Macula No topographic distortion, post ILM removal changes    Vessels Normal, no DR    Periphery Normal             IMAGING AND PROCEDURES  Imaging and Procedures for 05/08/21  OCT, Retina - OU - Both Eyes  Right Eye Quality was good. Scan locations included subfoveal. Central Foveal Thickness: 348. Progression has improved.   Left Eye Quality was good. Scan locations included subfoveal. Central Foveal Thickness: 291. Progression has been stable. Findings include normal foveal contour.   Notes OD,  improving macular thickness only 8 weeks post vitrectomy membrane peel for ERM  OS normal             ASSESSMENT/PLAN:  Macular pucker, right eye Best corrected visual acuity right eye now 2020 via pinhole, with 20/80 uncorrected right eye pseudophakic.  Post vitrectomy membrane peel right eye.  Follow-up with Dr. Rutherford Guys as scheduled     ICD-10-CM   1. Macular pucker, right eye  H35.371 OCT, Retina - OU - Both Eyes      1.  Excellent macular anatomy  anatomic outcome post vitrectomy membrane peel for severe epiretinal membrane.  Best corrected visual acuity now is 20/20 20/25 in the right eye, uncorrected is 20/80 right eye  2.  Follow with Dr. Rutherford Guys overall general eye care needs and here as needed basis  3.  Ophthalmic Meds Ordered this visit:  No orders of the defined types were placed in this encounter.      Return if symptoms worsen or fail to improve, for Follow-up Dr. Rutherford Guys for refraction in all general needs.  There are no Patient Instructions on file for this visit.   Explained the diagnoses, plan, and follow up with the patient and they expressed understanding.  Patient expressed understanding of the importance of proper follow up care.   Clent Demark Brinae Woods M.D. Diseases & Surgery of the Retina and Vitreous Retina [facility] Diabetic [place] 05/08/21     Abbreviations: M myopia (nearsighted); A astigmatism; H hyperopia (farsighted); P presbyopia; Mrx spectacle prescription;  CTL contact lenses; OD right eye; OS left eye; OU both eyes  XT exotropia; ET esotropia; PEK punctate epithelial keratitis; PEE punctate epithelial erosions; DES dry eye syndrome; MGD meibomian gland dysfunction; ATs artificial tears; PFAT's preservative free artificial tears; [place] nuclear sclerotic cataract; PSC posterior subcapsular cataract; ERM epi-retinal membrane; PVD posterior vitreous detachment; RD retinal detachment; DM diabetes mellitus; DR diabetic retinopathy;  NPDR non-proliferative diabetic retinopathy; PDR proliferative diabetic retinopathy; CSME clinically significant macular edema; DME diabetic macular edema; dbh dot blot hemorrhages; CWS cotton wool spot; POAG primary open angle glaucoma; C/D cup-to-disc ratio; HVF humphrey visual field; GVF goldmann visual field; OCT optical coherence tomography; IOP intraocular pressure; BRVO Branch retinal vein occlusion; CRVO central retinal vein occlusion; CRAO central retinal artery occlusion; BRAO branch retinal artery occlusion; RT retinal tear; SB scleral buckle; PPV pars plana vitrectomy; VH Vitreous hemorrhage; PRP panretinal laser photocoagulation; IVK intravitreal kenalog; VMT vitreomacular traction; MH Macular hole;  NVD neovascularization of the disc; NVE neovascularization elsewhere; AREDS age related eye disease study; ARMD age related macular degeneration; POAG primary open angle glaucoma; EBMD epithelial/anterior basement membrane dystrophy; ACIOL anterior chamber intraocular lens; IOL intraocular lens; PCIOL posterior chamber intraocular lens; Phaco/IOL phacoemulsification with intraocular lens placement; Cavalero photorefractive keratectomy; LASIK laser assisted in situ keratomileusis; HTN hypertension; DM diabetes mellitus; COPD chronic obstructive pulmonary disease

## 2021-05-08 NOTE — Assessment & Plan Note (Signed)
Best corrected visual acuity right eye now 2020 via pinhole, with 20/80 uncorrected right eye pseudophakic.  Post vitrectomy membrane peel right eye.  Follow-up with Dr. Rutherford Guys as scheduled

## 2021-06-07 ENCOUNTER — Ambulatory Visit: Payer: BC Managed Care – PPO | Admitting: Family Medicine

## 2021-06-07 ENCOUNTER — Encounter: Payer: Self-pay | Admitting: *Deleted

## 2021-06-07 ENCOUNTER — Other Ambulatory Visit: Payer: Self-pay

## 2021-06-07 ENCOUNTER — Encounter: Payer: Self-pay | Admitting: Family Medicine

## 2021-06-07 ENCOUNTER — Other Ambulatory Visit: Payer: Self-pay | Admitting: Family Medicine

## 2021-06-07 VITALS — BP 132/74 | HR 86 | Temp 98.5°F | Resp 14 | Ht 67.0 in | Wt 255.0 lb

## 2021-06-07 DIAGNOSIS — M67432 Ganglion, left wrist: Secondary | ICD-10-CM | POA: Diagnosis not present

## 2021-06-07 MED ORDER — MELOXICAM 15 MG PO TABS: 15.0000 mg | ORAL_TABLET | Freq: Every day | ORAL | 0 refills | Status: DC

## 2021-06-07 NOTE — Progress Notes (Signed)
Subjective:    Patient ID: Caitlyn [name], female    DOB: May 20, 1956, 65 y.o.   MRN: [id number]  HPI  In the picture shown above, at the base of the left first MCP joint, there is a small cystlike mass forming.  Is roughly 2 cm in diameter.  It is soft, spongy, freely mobile.  Patient noticed it since March.  She states that her wrist has been hurting around the same time. Past Medical History:  Diagnosis Date   Asthma    Cold and cough --- no fever   Complication of anesthesia    24 yrs ago post tubal-- pulm. edema  (no problems post bowel surg. 2009)   Depression    Diabetes mellitus    oral med   Diabetes mellitus without complication ([place])    Phreesia 11/28/2020   Diverticulitis hx  -- w/ perf. bowel   s/p resection 2009   Hypertension    Psoriasis    Reflux occasional   watches diet   Past Surgical History:  Procedure Laterality Date   BOWEL RESECTION  2009   diverticulitis w/ perf. bowel   COLON SURGERY N/A    Phreesia 11/28/2020   HYSTEROSCOPY  10/09/2011   Procedure: HYSTEROSCOPY;  Surgeon: Margarette Asal;  Location: [facility];  Service: Gynecology;;  HYSTEROSCOPY D & C   TUBAL LIGATION  24 yrs ago   Current Outpatient Medications on File Prior to Visit  Medication Sig Dispense Refill   albuterol (VENTOLIN HFA) 108 (90 Base) MCG/ACT inhaler Inhale 2 puffs into the lungs every 4 (four) hours as needed for wheezing or shortness of breath. 8 g 2   amLODipine (NORVASC) 10 MG tablet TAKE 1 TABLET(10 MG) BY MOUTH DAILY 90 tablet 3   aspirin 81 MG chewable tablet Chew 1 tablet (81 mg total) by mouth daily.     atorvastatin (LIPITOR) 10 MG tablet Take 2 tablets (20 mg total) by mouth daily. 30 tablet 1   clonazePAM (KLONOPIN) 0.5 MG tablet Take 1 tablet (0.5 mg total) by mouth 2 (two) times daily as needed for anxiety. 30 tablet 1   clopidogrel (PLAVIX) 75 MG tablet Take 1 tablet (75 mg total) by mouth daily. 21 tablet 0   COSENTYX SENSOREADY 300 DOSE 150  MG/ML SOAJ Inject 150 mg into the muscle every 30 (thirty) days.     fexofenadine (ALLEGRA) 180 MG tablet Take 180 mg by mouth at bedtime.     metFORMIN (GLUCOPHAGE) 1000 MG tablet Take 1 tablet (1,000 mg total) by mouth 2 (two) times daily with a meal. 180 tablet 3   metoprolol succinate (TOPROL-XL) 25 MG 24 hr tablet Take 1 tablet (25 mg total) by mouth daily. 90 tablet 3   montelukast (SINGULAIR) 10 MG tablet TAKE 1 TABLET(10 MG) BY MOUTH AT BEDTIME 90 tablet 3   topiramate (TOPAMAX) 50 MG tablet Take 1 tablet (50 mg total) by mouth 2 (two) times daily. 60 tablet 3   venlafaxine XR (EFFEXOR-XR) 150 MG 24 hr capsule TAKE 1 CAPSULE BY MOUTH EVERY DAY 90 capsule 3   No current facility-administered medications on file prior to visit.   Allergies  Allergen Reactions   Erythromycin Other (See Comments)    Gi upset/ cramps   Hctz [Hydrochlorothiazide]     CAUSED GOUT   Macrolides And Ketolides Other (See Comments)    unknown   Social History   Socioeconomic History   Marital status: Married    Spouse name:  Not on file   Number of children: Not on file   Years of education: Not on file   Highest education level: Not on file  Occupational History   Occupation: accountant  Tobacco Use   Smoking status: Never   Smokeless tobacco: Never  Substance and Sexual Activity   Alcohol use: No   Drug use: No   Sexual activity: Not on file  Other Topics Concern   Not on file  Social History Narrative   Lives with her husband   Social Determinants of Health   Financial Resource Strain: Not on file  Food Insecurity: Not on file  Transportation Needs: Not on file  Physical Activity: Not on file  Stress: Not on file  Social Connections: Not on file  Intimate Partner Violence: Not on file      Review of Systems     Objective:   Physical Exam Vitals reviewed.  Constitutional:      General: She is not in acute distress.    Appearance: Normal appearance. She is obese. She is not  ill-appearing or toxic-appearing.  Cardiovascular:     Rate and Rhythm: Normal rate and regular rhythm.     Heart sounds: Normal heart sounds. No murmur heard.   No friction rub. No gallop.  Pulmonary:     Effort: Pulmonary effort is normal. No respiratory distress.     Breath sounds: Normal breath sounds. No stridor. No wheezing or rales.  Musculoskeletal:     Left wrist: Deformity and tenderness present. No swelling, effusion, lacerations or crepitus. Normal range of motion.  Neurological:     Mental Status: She is alert.         Assessment & Plan:  Ganglion cyst of dorsum of left wrist Patient has a ganglion cyst in her left wrist.  I recommended taking meloxicam 15 mg daily to help reduce the inflammation and pain in the wrist that is likely contributing to the cyst.  If she would like to see a hand specialist for excision, I will be glad to arrange that for her.  Patient would like to try the NSAIDs first.

## 2021-06-08 ENCOUNTER — Other Ambulatory Visit: Payer: Self-pay | Admitting: Family Medicine

## 2021-06-12 ENCOUNTER — Ambulatory Visit: Payer: BC Managed Care – PPO | Admitting: Adult Health

## 2021-06-12 ENCOUNTER — Encounter: Payer: Self-pay | Admitting: Adult Health

## 2021-06-12 VITALS — BP 124/84 | HR 94 | Ht 67.0 in | Wt 252.5 lb

## 2021-06-12 DIAGNOSIS — G43009 Migraine without aura, not intractable, without status migrainosus: Secondary | ICD-10-CM

## 2021-06-12 DIAGNOSIS — E785 Hyperlipidemia, unspecified: Secondary | ICD-10-CM | POA: Diagnosis not present

## 2021-06-12 DIAGNOSIS — Z8673 Personal history of transient ischemic attack (TIA), and cerebral infarction without residual deficits: Secondary | ICD-10-CM | POA: Diagnosis not present

## 2021-06-12 DIAGNOSIS — I10 Essential (primary) hypertension: Secondary | ICD-10-CM

## 2021-06-12 DIAGNOSIS — E119 Type 2 diabetes mellitus without complications: Secondary | ICD-10-CM

## 2021-06-12 MED ORDER — ASPIRIN EC 81 MG PO TBEC: 81.0000 mg | DELAYED_RELEASE_TABLET | Freq: Every day | ORAL | 11 refills | Status: AC

## 2021-06-12 NOTE — Progress Notes (Signed)
I agree with the above plan 

## 2021-06-12 NOTE — Progress Notes (Signed)
[facility] [address] [place]. [place] [address] ([phone]) [place] Julietta Batterman [facility] Date of Birth:  [birth year] Medical Record Number:  [id number]   Reason for Referral:  hospital stroke follow up    SUBJECTIVE:   CHIEF COMPLAINT:  Chief Complaint  Patient presents with   Follow-up    Rm 3 with husband- Here for f/u since Hsp visit in April. Pt reports overall feeling ok she has felt much better since d/c the atorvastatin, Plavix, Topamax, and baby aspirin. Pt felt achy, groggy while on these meds. Pt thinks the event on 03/22/21 was related to stress.      HPI:   Caitlyn [name] is a 65 y.o. female depression, DM2, HTN, GERD who presented on 03/22/2021 with 10 day history of headaches and episodes of intermittent facial droop and arm weakness.  Personally reviewed hospitalization pertinent notes, lab work and imaging summary provided.  Evaluated by Dr. Leonie Man for strokelike symptoms likely atypical migraine accompanied by bilateral facial/hand numbness and tingling.  MR brain negative for acute infarct with remote lacunar infarcts noted involving the deep white matter of the left frontal centrum semi ovale and cerebellum.  MRI negative for LVO with questionable severe distal left P2/P3 junction stenosis.  Carotid Doppler right ICA 1 to 39% stenosis with ECA >50% stenosis and left ICA normal.  2D echo EF 60 to 65%.  LDL 101.  A1c 6.7.  Recommended aspirin 81 mg daily as well as atorvastatin 20 mg daily.  HTN stable on amlodipine.  Other stroke risk factors include obesity and prior strokes on imaging.  Topamax initiated for migraine prevention.  Today, 06/12/2021, [name] is being seen for hospital accompanied by her husband.  She initially had difficulty after discharge feeling "achy and groggy" which resolved after stopping atorvastatin, Plavix, Topamax and aspirin (all stopped at same time).  She has had 1-2 additional headaches since  discharge and resolved after use of Advil.  She believes recent events was likely due to increased work related stress - she plans on retiring in 2 days.  She does have remote history of migraines which she believes was triggered by stress as they subsided after leaving her prior job.  Blood pressures today 124/84 -does not routinely monitor at home as typically stable.  No further concerns at this time.     ROS:   14 system review of systems performed and negative with exception of those listed in HPI  PMH:  Past Medical History:  Diagnosis Date   Asthma    Cold and cough --- no fever   Complication of anesthesia    24 yrs ago post tubal-- pulm. edema  (no problems post bowel surg. 2009)   Depression    Diabetes mellitus    oral med   Diabetes mellitus without complication ([place])    Phreesia 11/28/2020   Diverticulitis hx  -- w/ perf. bowel   s/p resection 2009   Hypertension    Psoriasis    Reflux occasional   watches diet    PSH:  Past Surgical History:  Procedure Laterality Date   BOWEL RESECTION  2009   diverticulitis w/ perf. bowel   COLON SURGERY N/A    Phreesia 11/28/2020   HYSTEROSCOPY  10/09/2011   Procedure: HYSTEROSCOPY;  Surgeon: Margarette Asal;  Location: [facility];  Service: Gynecology;;  HYSTEROSCOPY D & C   TUBAL LIGATION  24 yrs  ago    Social History:  Social History   Socioeconomic History   Marital status: Married    Spouse name: Not on file   Number of children: Not on file   Years of education: Not on file   Highest education level: Not on file  Occupational History   Occupation: accountant  Tobacco Use   Smoking status: Never   Smokeless tobacco: Never  Substance and Sexual Activity   Alcohol use: No   Drug use: No   Sexual activity: Not on file  Other Topics Concern   Not on file  Social History Narrative   Lives with her husband   1-2 cups daily of Caffeine    Social Determinants of Health   Financial  Resource Strain: Not on file  Food Insecurity: Not on file  Transportation Needs: Not on file  Physical Activity: Not on file  Stress: Not on file  Social Connections: Not on file  Intimate Partner Violence: Not on file    Family History:  Family History  Problem Relation Age of Onset   Stroke Father    Diabetes Brother     Medications:   Current Outpatient Medications on File Prior to Visit  Medication Sig Dispense Refill   albuterol (VENTOLIN HFA) 108 (90 Base) MCG/ACT inhaler Inhale 2 puffs into the lungs every 4 (four) hours as needed for wheezing or shortness of breath. 8 g 2   amLODipine (NORVASC) 10 MG tablet TAKE 1 TABLET(10 MG) BY MOUTH DAILY 90 tablet 3   COSENTYX SENSOREADY 300 DOSE 150 MG/ML SOAJ Inject 150 mg into the muscle every 30 (thirty) days.     metFORMIN (GLUCOPHAGE) 1000 MG tablet TAKE 1 TABLET(1000 MG) BY MOUTH TWICE DAILY WITH A MEAL 180 tablet 3   metoprolol succinate (TOPROL-XL) 25 MG 24 hr tablet Take 1 tablet (25 mg total) by mouth daily. 90 tablet 3   montelukast (SINGULAIR) 10 MG tablet TAKE 1 TABLET(10 MG) BY MOUTH AT BEDTIME 90 tablet 3   venlafaxine XR (EFFEXOR-XR) 150 MG 24 hr capsule TAKE 1 CAPSULE BY MOUTH EVERY DAY 90 capsule 3   fexofenadine (ALLEGRA) 180 MG tablet Take 180 mg by mouth at bedtime. (Patient not taking: Reported on 06/12/2021)     No current facility-administered medications on file prior to visit.    Allergies:   Allergies  Allergen Reactions   Erythromycin Other (See Comments)    Gi upset/ cramps   Hctz [Hydrochlorothiazide]     CAUSED GOUT   Macrolides And Ketolides Other (See Comments)    unknown      OBJECTIVE:  Physical Exam  Vitals:   06/12/21 1121  BP: 124/84  Pulse: 94  SpO2: 94%  Weight: 252 lb 8 oz (114.5 kg)  Height: 5' 7"  (1.702 m)   Body mass index is 39.55 kg/m. No results found.  Post stroke PHQ 2/9 Depression screen PHQ 2/9 06/07/2021  Decreased Interest 0  Down, Depressed, Hopeless 0   PHQ - 2 Score 0     General: well developed, well nourished, very pleasant middle-age Caucasian female, seated, in no evident distress Head: head normocephalic and atraumatic.   Neck: supple with no carotid or supraclavicular bruits Cardiovascular: regular rate and rhythm, no murmurs Musculoskeletal: no deformity Skin:  no rash/petichiae Vascular:  Normal pulses all extremities   Neurologic Exam Mental Status: Awake and fully alert.  Fluent speech and language.  Oriented to place and time. Recent and remote memory intact. Attention span, concentration and fund  of knowledge appropriate. Mood and affect appropriate.  Cranial Nerves: Fundoscopic exam reveals sharp disc margins. Pupils equal, briskly reactive to light. Extraocular movements full without nystagmus. Visual fields full to confrontation. Hearing intact. Facial sensation intact. Face, tongue, palate moves normally and symmetrically.  Motor: Normal bulk and tone. Normal strength in all tested extremity muscles Sensory.: intact to touch , pinprick , position and vibratory sensation.  Coordination: Rapid alternating movements normal in all extremities. Finger-to-nose and heel-to-shin performed accurately bilaterally. Gait and Station: Arises from chair without difficulty. Stance is normal. Gait demonstrates normal stride length and balance without use of assistive device.  Reflexes: 1+ and symmetric. Toes downgoing.     NIHSS  0 Modified Rankin  0      ASSESSMENT: Caitlyn [name] is a 65 y.o. year old female with recent episode of intermittent facial droop and arm weakness associated with 10-day history of headaches on 03/22/2021 likely in setting of atypical migraine. Vascular risk factors include prior strokes on imaging, HTN, HLD, DM and obesity.      PLAN:  Atypical migraine: Likely in setting of increased stressors. 1-2 additional mild headaches over the past 3 months.  No indication for migraine prophylaxis at this time.   Intolerant of Topamax.  Further monitoring by PCP Hx of stroke (on imaging): Recommend restarting aspirin 81 mg daily for secondary stroke prevention.  She plans on further discussing statin therapy with PCP Dr. Dennard Schaumann at next visit. Discussed secondary stroke prevention measures and importance of close PCP follow up for aggressive stroke risk factor management  HTN: BP goal <130/90.  Stable on amlodipine per PCP HLD: LDL goal <70. Recent LDL 101 - will follow up with PCP to discuss restarting statin therapy which is highly encouraged in setting of prior strokes on imaging.  DMII: A1c goal<7.0. Recent A1c 6.7 on metformin.    Advised to follow-up on an as-needed basis   CC:  [facility] provider: Dr. Leonie Man PCP: Susy Frizzle, MD    I spent 44 minutes of face-to-face and non-face-to-face time with patient and husband.  This included previsit chart review including review of recent hospitalization, lab review, study review, electronic health record documentation, patient education and discussion regarding recent event likely atypical migraine, history of prior strokes on imaging, secondary stroke prevention measures and aggressive stroke risk factor management and answered all questions to patient and husband's satisfaction  Frann Rider, AGNP-BC  [facility] [address] [place], [place] [address]  Phone [phone] Fax [phone] Note: This document was prepared with digital dictation and possible smart phrase technology. Any transcriptional errors that result from this process are unintentional.

## 2021-06-12 NOTE — Patient Instructions (Addendum)
Continue aspirin 81 mg daily for secondary stroke prevention - please ensure you discuss starting a cholesterol medication with Dr. Dennard Schaumann  Continue to follow up with PCP regarding cholesterol, diabetes and blood pressure management  Maintain strict control of hypertension with blood pressure goal below 130/90. Diabetes with A1c <7 and cholesterol with LDL cholesterol (bad cholesterol) goal below 70 mg/dL.        Thank you for coming to see [place] at [facility]. I hope we have been able to provide you high quality care today.  You may receive a patient satisfaction survey over the next few weeks. We would appreciate your feedback and comments so that we may continue to improve ourselves and the health of our patients.

## 2021-06-19 ENCOUNTER — Other Ambulatory Visit: Payer: Self-pay | Admitting: Family Medicine

## 2021-08-21 ENCOUNTER — Other Ambulatory Visit: Payer: Self-pay

## 2021-08-21 ENCOUNTER — Ambulatory Visit: Payer: BC Managed Care – PPO | Admitting: Family Medicine

## 2021-08-21 VITALS — BP 134/86 | HR 80 | Temp 98.0°F | Resp 16

## 2021-08-21 DIAGNOSIS — R300 Dysuria: Secondary | ICD-10-CM

## 2021-08-21 DIAGNOSIS — E1169 Type 2 diabetes mellitus with other specified complication: Secondary | ICD-10-CM | POA: Diagnosis not present

## 2021-08-21 LAB — URINALYSIS, ROUTINE W REFLEX MICROSCOPIC
Ketones, ur: NEGATIVE
Nitrite: NEGATIVE
Specific Gravity, Urine: 1.026 (ref 1.001–1.035)
pH: 5.5 (ref 5.0–8.0)

## 2021-08-21 LAB — MICROSCOPIC MESSAGE

## 2021-08-21 MED ORDER — SULFAMETHOXAZOLE-TRIMETHOPRIM 800-160 MG PO TABS: 1.0000 | ORAL_TABLET | Freq: Two times a day (BID) | ORAL | 0 refills | Status: DC

## 2021-08-21 NOTE — Progress Notes (Signed)
Subjective:    Patient ID: Caitlyn [name], female    DOB: 1956/09/10, 65 y.o.   MRN: [id number]  HPI Patient reports a 2 to 3-day history of dysuria.  She reports urgency and frequency and hesitancy.  She reports pressure-like discomfort in her lower pelvis.  Urinalysis today shows trace blood, +1 bilirubin, +3 blood, +2 protein, negative nitrites, positive leukocyte esterase.  Quantity was insufficient for micro or culture. Past Medical History:  Diagnosis Date   Asthma    Cold and cough --- no fever   Complication of anesthesia    24 yrs ago post tubal-- pulm. edema  (no problems post bowel surg. 2009)   Depression    Diabetes mellitus    oral med   Diabetes mellitus without complication ([place])    Phreesia 11/28/2020   Diverticulitis hx  -- w/ perf. bowel   s/p resection 2009   Hypertension    Psoriasis    Reflux occasional   watches diet   Past Surgical History:  Procedure Laterality Date   BOWEL RESECTION  2009   diverticulitis w/ perf. bowel   COLON SURGERY N/A    Phreesia 11/28/2020   HYSTEROSCOPY  10/09/2011   Procedure: HYSTEROSCOPY;  Surgeon: Margarette Asal;  Location: [facility];  Service: Gynecology;;  HYSTEROSCOPY D & C   TUBAL LIGATION  24 yrs ago   Current Outpatient Medications on File Prior to Visit  Medication Sig Dispense Refill   albuterol (VENTOLIN HFA) 108 (90 Base) MCG/ACT inhaler Inhale 2 puffs into the lungs every 4 (four) hours as needed for wheezing or shortness of breath. 8 g 2   amLODipine (NORVASC) 10 MG tablet TAKE 1 TABLET(10 MG) BY MOUTH DAILY 90 tablet 3   aspirin EC 81 MG tablet Take 1 tablet (81 mg total) by mouth daily. Swallow whole. 30 tablet 11   COSENTYX SENSOREADY 300 DOSE 150 MG/ML SOAJ Inject 150 mg into the muscle every 30 (thirty) days.     fexofenadine (ALLEGRA) 180 MG tablet Take 180 mg by mouth at bedtime. (Patient not taking: Reported on 06/12/2021)     metFORMIN (GLUCOPHAGE) 1000 MG tablet TAKE 1 TABLET(1000  MG) BY MOUTH TWICE DAILY WITH A MEAL 180 tablet 3   metoprolol succinate (TOPROL-XL) 25 MG 24 hr tablet Take 1 tablet (25 mg total) by mouth daily. 90 tablet 3   montelukast (SINGULAIR) 10 MG tablet TAKE 1 TABLET(10 MG) BY MOUTH AT BEDTIME 90 tablet 3   venlafaxine XR (EFFEXOR-XR) 150 MG 24 hr capsule TAKE 1 CAPSULE BY MOUTH EVERY DAY 90 capsule 3   No current facility-administered medications on file prior to visit.   Allergies  Allergen Reactions   Erythromycin Other (See Comments)    Gi upset/ cramps   Hctz [Hydrochlorothiazide]     CAUSED GOUT   Macrolides And Ketolides Other (See Comments)    unknown   Social History   Socioeconomic History   Marital status: Married    Spouse name: Not on file   Number of children: Not on file   Years of education: Not on file   Highest education level: Not on file  Occupational History   Occupation: accountant  Tobacco Use   Smoking status: Never   Smokeless tobacco: Never  Substance and Sexual Activity   Alcohol use: No   Drug use: No   Sexual activity: Not on file  Other Topics Concern   Not on file  Social History Narrative   Lives  with her husband   1-2 cups daily of Caffeine    Social Determinants of Health   Financial Resource Strain: Not on file  Food Insecurity: Not on file  Transportation Needs: Not on file  Physical Activity: Not on file  Stress: Not on file  Social Connections: Not on file  Intimate Partner Violence: Not on file      Review of Systems     Objective:   Physical Exam Vitals reviewed.  Constitutional:      General: She is not in acute distress.    Appearance: Normal appearance. She is obese. She is not ill-appearing or toxic-appearing.  Cardiovascular:     Rate and Rhythm: Normal rate and regular rhythm.     Heart sounds: Normal heart sounds. No murmur heard.   No friction rub. No gallop.  Pulmonary:     Effort: Pulmonary effort is normal. No respiratory distress.     Breath sounds:  Normal breath sounds. No stridor. No wheezing or rales.  Musculoskeletal:     Left wrist: No crepitus.  Neurological:     Mental Status: She is alert.         Assessment & Plan:  Type 2 diabetes mellitus with other specified complication, unspecified whether long term insulin use (HCC) - Plan: Hemoglobin A1c, COMPLETE METABOLIC PANEL WITH GFR, Lipid panel  Dysuria - Plan: Urine Culture, Urinalysis, Routine w reflex microscopic Urinalysis suggest urinary tract infection.  Begin Bactrim double strength tablets twice daily for 7 days.  I am concerned about a blood in her urine.  Therefore I recommended that we check fasting lab work today including CMP, lipid panel, A1c.  Goal A1c is less than 6.5.  If significantly elevated, would recommend adding additional medication to her metformin.  I encouraged the patient to check her blood sugar fasting in the morning as well as 2 hours after meals.  Goal fasting blood sugars less than 130.  Goal 2-hour postprandial sugars less than 160.

## 2021-08-22 LAB — COMPLETE METABOLIC PANEL WITH GFR
AG Ratio: 1.3 (calc) (ref 1.0–2.5)
ALT: 22 U/L (ref 6–29)
AST: 21 U/L (ref 10–35)
Albumin: 4.3 g/dL (ref 3.6–5.1)
Alkaline phosphatase (APISO): 85 U/L (ref 37–153)
BUN/Creatinine Ratio: 16 (calc) (ref 6–22)
BUN: 19 mg/dL (ref 7–25)
CO2: 24 mmol/L (ref 20–32)
Calcium: 9.9 mg/dL (ref 8.6–10.4)
Chloride: 103 mmol/L (ref 98–110)
Creat: 1.18 mg/dL — ABNORMAL HIGH (ref 0.50–1.05)
Globulin: 3.3 g/dL (calc) (ref 1.9–3.7)
Glucose, Bld: 202 mg/dL — ABNORMAL HIGH (ref 65–99)
Potassium: 5.7 mmol/L — ABNORMAL HIGH (ref 3.5–5.3)
Sodium: 136 mmol/L (ref 135–146)
Total Bilirubin: 0.6 mg/dL (ref 0.2–1.2)
Total Protein: 7.6 g/dL (ref 6.1–8.1)
eGFR: 52 mL/min/{1.73_m2} — ABNORMAL LOW (ref >=60)

## 2021-08-22 LAB — HEMOGLOBIN A1C
Hgb A1c MFr Bld: 8.4 % of total Hgb — ABNORMAL HIGH (ref <5.7)
Mean Plasma Glucose: 194 mg/dL
eAG (mmol/L): 10.8 mmol/L

## 2021-08-22 LAB — LIPID PANEL
Cholesterol: 169 mg/dL (ref <200)
HDL: 51 mg/dL (ref >=50)
LDL Cholesterol (Calc): 96 mg/dL (calc)
Non-HDL Cholesterol (Calc): 118 mg/dL (calc) (ref <130)
Total CHOL/HDL Ratio: 3.3 (calc) (ref <5.0)
Triglycerides: 123 mg/dL (ref <150)

## 2021-08-23 ENCOUNTER — Encounter: Payer: Self-pay | Admitting: Family Medicine

## 2021-08-23 LAB — URINE CULTURE
MICRO NUMBER:: 12428137
SPECIMEN QUALITY:: ADEQUATE

## 2021-08-26 ENCOUNTER — Encounter: Payer: Self-pay | Admitting: Family Medicine

## 2021-08-27 MED ORDER — BLOOD GLUCOSE SYSTEM PAK KIT: PACK | 1 refills | Status: DC

## 2021-08-27 MED ORDER — LANCETS MISC
1 refills | Status: DC
Start: 2021-08-27 — End: 2021-11-30

## 2021-08-27 MED ORDER — BLOOD GLUCOSE TEST VI STRP
ORAL_STRIP | 1 refills | Status: DC
Start: 2021-08-27 — End: 2024-04-26

## 2021-08-29 ENCOUNTER — Other Ambulatory Visit: Payer: Self-pay | Admitting: *Deleted

## 2021-08-29 DIAGNOSIS — E875 Hyperkalemia: Secondary | ICD-10-CM

## 2021-08-31 ENCOUNTER — Other Ambulatory Visit: Payer: Self-pay

## 2021-08-31 ENCOUNTER — Ambulatory Visit (INDEPENDENT_AMBULATORY_CARE_PROVIDER_SITE_OTHER): Payer: BC Managed Care – PPO | Admitting: *Deleted

## 2021-08-31 ENCOUNTER — Other Ambulatory Visit: Payer: BC Managed Care – PPO

## 2021-08-31 DIAGNOSIS — Z23 Encounter for immunization: Secondary | ICD-10-CM

## 2021-08-31 DIAGNOSIS — E875 Hyperkalemia: Secondary | ICD-10-CM | POA: Diagnosis not present

## 2021-09-01 ENCOUNTER — Encounter (HOSPITAL_COMMUNITY): Payer: Self-pay | Admitting: Emergency Medicine

## 2021-09-01 ENCOUNTER — Other Ambulatory Visit: Payer: Self-pay

## 2021-09-01 ENCOUNTER — Telehealth: Payer: Self-pay | Admitting: Nurse Practitioner

## 2021-09-01 ENCOUNTER — Emergency Department (HOSPITAL_COMMUNITY)
Admission: EM | Admit: 2021-09-01 | Discharge: 2021-09-01 | Disposition: A | Discharge status: Home / Self Care | Payer: BC Managed Care – PPO | Attending: Emergency Medicine | Admitting: Emergency Medicine

## 2021-09-01 DIAGNOSIS — E119 Type 2 diabetes mellitus without complications: Secondary | ICD-10-CM | POA: Insufficient documentation

## 2021-09-01 DIAGNOSIS — Z7984 Long term (current) use of oral hypoglycemic drugs: Secondary | ICD-10-CM | POA: Insufficient documentation

## 2021-09-01 DIAGNOSIS — Z7982 Long term (current) use of aspirin: Secondary | ICD-10-CM | POA: Insufficient documentation

## 2021-09-01 DIAGNOSIS — J45909 Unspecified asthma, uncomplicated: Secondary | ICD-10-CM | POA: Diagnosis not present

## 2021-09-01 DIAGNOSIS — I10 Essential (primary) hypertension: Secondary | ICD-10-CM | POA: Insufficient documentation

## 2021-09-01 DIAGNOSIS — E875 Hyperkalemia: Secondary | ICD-10-CM | POA: Insufficient documentation

## 2021-09-01 DIAGNOSIS — R0602 Shortness of breath: Secondary | ICD-10-CM | POA: Diagnosis not present

## 2021-09-01 DIAGNOSIS — Z79899 Other long term (current) drug therapy: Secondary | ICD-10-CM | POA: Insufficient documentation

## 2021-09-01 LAB — CBC WITH DIFFERENTIAL/PLATELET
Abs Immature Granulocytes: 0.02 10*3/uL (ref 0.00–0.07)
Basophils Absolute: 0.1 10*3/uL (ref 0.0–0.1)
Basophils Relative: 1 %
Eosinophils Absolute: 0.2 10*3/uL (ref 0.0–0.5)
Eosinophils Relative: 3 %
HCT: 40.5 % (ref 36.0–46.0)
Hemoglobin: 13.6 g/dL (ref 12.0–15.0)
Immature Granulocytes: 0 %
Lymphocytes Relative: 25 %
Lymphs Abs: 1.5 10*3/uL (ref 0.7–4.0)
MCH: 31.4 pg (ref 26.0–34.0)
MCHC: 33.6 g/dL (ref 30.0–36.0)
MCV: 93.5 fL (ref 80.0–100.0)
Monocytes Absolute: 0.4 10*3/uL (ref 0.1–1.0)
Monocytes Relative: 7 %
Neutro Abs: 3.8 10*3/uL (ref 1.7–7.7)
Neutrophils Relative %: 64 %
Platelets: 307 10*3/uL (ref 150–400)
RBC: 4.33 MIL/uL (ref 3.87–5.11)
RDW: 11.9 % (ref 11.5–15.5)
WBC: 5.9 10*3/uL (ref 4.0–10.5)
nRBC: 0 % (ref 0.0–0.2)

## 2021-09-01 LAB — BASIC METABOLIC PANEL
Anion gap: 7 (ref 5–15)
BUN/Creatinine Ratio: 22 (calc) (ref 6–22)
BUN: 28 mg/dL — ABNORMAL HIGH (ref 7–25)
BUN: 28 mg/dL — ABNORMAL HIGH (ref 8–23)
CO2: 23 mmol/L (ref 20–32)
CO2: 23 mmol/L (ref 22–32)
Calcium: 9.9 mg/dL (ref 8.6–10.4)
Calcium: 9.9 mg/dL (ref 8.9–10.3)
Chloride: 103 mmol/L (ref 98–110)
Chloride: 105 mmol/L (ref 98–111)
Creat: 1.27 mg/dL — ABNORMAL HIGH (ref 0.50–1.05)
Creatinine, Ser: 1.25 mg/dL — ABNORMAL HIGH (ref 0.44–1.00)
GFR, Estimated: 48 mL/min — ABNORMAL LOW (ref >60)
Glucose, Bld: 180 mg/dL — ABNORMAL HIGH (ref 65–99)
Glucose, Bld: 201 mg/dL — ABNORMAL HIGH (ref 70–99)
Potassium: 6.5 mmol/L (ref 3.5–5.3)
Potassium: 6 mmol/L — ABNORMAL HIGH (ref 3.5–5.1)
Sodium: 134 mmol/L — ABNORMAL LOW (ref 135–146)
Sodium: 135 mmol/L (ref 135–145)

## 2021-09-01 MED ORDER — LOKELMA 10 G PO PACK: 10.0000 g | PACK | Freq: Once | ORAL | 0 refills | Status: DC

## 2021-09-01 MED ORDER — SODIUM ZIRCONIUM CYCLOSILICATE 5 G PO PACK
10.0000 g | PACK | Freq: Once | ORAL | Status: AC
  Administered 2021-09-01: 10 g via ORAL
  Filled 2021-09-01: qty 2

## 2021-09-01 NOTE — ED Triage Notes (Signed)
Patient to the ED with complaints of potassium level of 6.5.

## 2021-09-01 NOTE — ED Provider Notes (Signed)
[facility] Provider Note  CSN: [id number] Arrival date & time: 09/01/21 1231    History Chief Complaint  Patient presents with   ABNORMAL LABS    Caitlyn [name] is a 65 y.o. female reports she was in PCP office 9/27 for dysuria, had labs done that day showing UTI (treated with Bactrim) as well as elevated K to 5.7. She was back at PCP office yesterday for flu vaccine and had repeat BMP drawn. Called this morning because K was 6.5. she has not been taking any supplements or OTC meds. She denies any weakness or palpitations. Has been eating and drinking well. No V/D or dysuria but reports occasional nausea. She has had some mild SOB which she attributes to obesity.    Past Medical History:  Diagnosis Date   Asthma    Cold and cough --- no fever   Complication of anesthesia    24 yrs ago post tubal-- pulm. edema  (no problems post bowel surg. 2009)   Depression    Diabetes mellitus    oral med   Diabetes mellitus without complication ([place])    Phreesia 11/28/2020   Diverticulitis hx  -- w/ perf. bowel   s/p resection 2009   Hypertension    Psoriasis    Reflux occasional   watches diet    Past Surgical History:  Procedure Laterality Date   BOWEL RESECTION  2009   diverticulitis w/ perf. bowel   COLON SURGERY N/A    Phreesia 11/28/2020   HYSTEROSCOPY  10/09/2011   Procedure: HYSTEROSCOPY;  Surgeon: Margarette Asal;  Location: [facility];  Service: Gynecology;;  HYSTEROSCOPY D & C   TUBAL LIGATION  24 yrs ago    Family History  Problem Relation Age of Onset   Stroke Father    Diabetes Brother     Social History   Tobacco Use   Smoking status: Never   Smokeless tobacco: Never  Vaping Use   Vaping Use: Never used  Substance Use Topics   Alcohol use: No   Drug use: No     Home Medications Prior to Admission medications   Medication Sig Start Date End Date Taking? Authorizing Provider  sodium zirconium cyclosilicate  (LOKELMA) 10 g PACK packet Take 10 g by mouth once for 1 dose. 09/03/21 09/03/21 Yes Truddie Hidden, MD  albuterol (VENTOLIN HFA) 108 (90 Base) MCG/ACT inhaler Inhale 2 puffs into the lungs every 4 (four) hours as needed for wheezing or shortness of breath. 10/31/20   Alycia Rossetti, MD  amLODipine (NORVASC) 10 MG tablet TAKE 1 TABLET(10 MG) BY MOUTH DAILY 06/19/21   Susy Frizzle, MD  amLODipine (NORVASC) 10 MG tablet 1 tablet    [provider]  aspirin EC 81 MG tablet Take 1 tablet (81 mg total) by mouth daily. Swallow whole. 06/12/21   Frann Rider, NP  Blood Glucose Monitoring Suppl (BLOOD GLUCOSE SYSTEM PAK) KIT Use as directed to monitor FSBS 4x daily. Dx: E11.65. 08/27/21   Susy Frizzle, MD  COSENTYX SENSOREADY 300 DOSE 150 MG/ML SOAJ Inject 150 mg into the muscle every 30 (thirty) days. 08/21/17   [provider]  fexofenadine (ALLEGRA) 180 MG tablet Take 180 mg by mouth at bedtime. Patient not taking: Reported on 06/12/2021    [provider]  Glucose Blood (BLOOD GLUCOSE TEST STRIPS) STRP Use as directed to monitor FSBS 4x daily. Dx: E11.65. 08/27/21   Susy Frizzle, MD  Lancets  MISC Use as directed to monitor FSBS 4x daily. Dx: E11.65. 08/27/21   Susy Frizzle, MD  losartan (COZAAR) 50 MG tablet 1 tablet    [provider]  metFORMIN (GLUCOPHAGE) 1000 MG tablet TAKE 1 TABLET(1000 MG) BY MOUTH TWICE DAILY WITH A MEAL 06/08/21   Susy Frizzle, MD  metoprolol succinate (TOPROL-XL) 25 MG 24 hr tablet Take 1 tablet (25 mg total) by mouth daily. 06/20/20   Susy Frizzle, MD  montelukast (SINGULAIR) 10 MG tablet TAKE 1 TABLET(10 MG) BY MOUTH AT BEDTIME 06/07/21   Susy Frizzle, MD  montelukast (SINGULAIR) 10 MG tablet 1 tablet in the evening    [provider]  sulfamethoxazole-trimethoprim (BACTRIM DS) 800-160 MG tablet Take 1 tablet by mouth 2 (two) times daily. 08/21/21   Susy Frizzle, MD  venlafaxine XR (EFFEXOR-XR)  150 MG 24 hr capsule TAKE 1 CAPSULE BY MOUTH EVERY DAY 06/07/21   Susy Frizzle, MD     Allergies    Erythromycin, Hctz [hydrochlorothiazide], and Macrolides and ketolides   Review of Systems   Review of Systems A comprehensive review of systems was completed and negative except as noted in HPI.    Physical Exam BP 120/77   Pulse 80   Temp 97.7 F (36.5 C) (Oral)   Resp (!) 28   Ht _0  (1.702 m)   Wt 113.4 kg   SpO2 95%   BMI 39.16 kg/m   Physical Exam Vitals and nursing note reviewed.  Constitutional:      Appearance: Normal appearance.  HENT:     Head: Normocephalic and atraumatic.     Nose: Nose normal.     Mouth/Throat:     Mouth: Mucous membranes are moist.  Eyes:     Extraocular Movements: Extraocular movements intact.     Conjunctiva/sclera: Conjunctivae normal.  Cardiovascular:     Rate and Rhythm: Normal rate.  Pulmonary:     Effort: Pulmonary effort is normal.     Breath sounds: Normal breath sounds.  Abdominal:     General: Abdomen is flat.     Palpations: Abdomen is soft.     Tenderness: There is no abdominal tenderness.  Musculoskeletal:        General: No swelling. Normal range of motion.     Cervical back: Neck supple.  Skin:    General: Skin is warm and dry.  Neurological:     General: No focal deficit present.     Mental Status: She is alert.  Psychiatric:        Mood and Affect: Mood normal.     ED Results / Procedures / Treatments   Labs (all labs ordered are listed, but only abnormal results are displayed) Labs Reviewed  BASIC METABOLIC PANEL - Abnormal; Notable for the following components:      Result Value   Potassium 6.0 (*)    Glucose, Bld 201 (*)    BUN 28 (*)    Creatinine, Ser 1.25 (*)    GFR, Estimated 48 (*)    All other components within normal limits  CBC WITH DIFFERENTIAL/PLATELET    EKG EKG Interpretation  Date/Time:  Saturday September 01 2021 12:53:24 EDT Ventricular Rate:  82 PR Interval:  156 QRS  Duration: 93 QT Interval:  344 QTC Calculation: 402 R Axis:   13 Text Interpretation: Sinus rhythm Normal ECG No significant change since last tracing Confirmed by Calvert Cantor [phone]) on 09/01/2021 1:14:34 PM  Radiology No results found.  Procedures Procedures  Medications Ordered in the ED Medications  sodium zirconium cyclosilicate (LOKELMA) packet 10 g (has no administration in time range)     MDM Rules/Calculators/A&P MDM   ED Course  I have reviewed the triage vital signs and the nursing notes.  Pertinent labs & imaging results that were available during my care of the patient were reviewed by me and considered in my medical decision making (see chart for details).  Clinical Course as of 09/01/21 1447  Sat Sep 01, 2021  1342 CBC is normal.  [CS]  1403 BMP with mildly elevated K, no signs of significant EKG changes. Unclear etiology.  [CS]  63 Review of old records shows she often has elevated K, baseline is 5.5-ish. She states PCP has not told her what the underlying cause is but does not appear to be an acute worsening today. Will give a dose of Lokelma here and another to take at home in 1-2 days. Recommend close PCP follow up to begin evaluation of underlying cause of her chronic hyperkalemia. Patient and husband in agreement.  [CS]    Clinical Course User Index [CS] Truddie Hidden, MD    Final Clinical Impression(s) / ED Diagnoses Final diagnoses:  Hyperkalemia    Rx / DC Orders ED Discharge Orders          Ordered    sodium zirconium cyclosilicate (LOKELMA) 10 g PACK packet   Once        09/01/21 1447             Truddie Hidden, MD 09/01/21 1447

## 2021-09-01 NOTE — Telephone Encounter (Signed)
I received critical lab value K+ 6.5 from lab.  Called patient and left message.  She returned my call and denied any chest pain or muscle pain.  I explained to her that this can be a serious lab value and advised her to go to the hospital to have her potassium level rechecked and to check her heart.  She verbalized understanding and stated she would go to [facility].  I called the [facility] ED and spoke with a nurse to alert them.

## 2021-09-03 ENCOUNTER — Other Ambulatory Visit: Payer: BC Managed Care – PPO

## 2021-09-03 ENCOUNTER — Encounter: Payer: Self-pay | Admitting: Family Medicine

## 2021-09-03 ENCOUNTER — Other Ambulatory Visit: Payer: Self-pay | Admitting: *Deleted

## 2021-09-03 ENCOUNTER — Other Ambulatory Visit: Payer: Self-pay

## 2021-09-03 DIAGNOSIS — E875 Hyperkalemia: Secondary | ICD-10-CM

## 2021-09-03 LAB — BASIC METABOLIC PANEL
BUN/Creatinine Ratio: 17 (calc) (ref 6–22)
BUN: 26 mg/dL — ABNORMAL HIGH (ref 7–25)
CO2: 24 mmol/L (ref 20–32)
Calcium: 9.4 mg/dL (ref 8.6–10.4)
Chloride: 105 mmol/L (ref 98–110)
Creat: 1.49 mg/dL — ABNORMAL HIGH (ref 0.50–1.05)
Glucose, Bld: 189 mg/dL — ABNORMAL HIGH (ref 65–99)
Potassium: 6 mmol/L — ABNORMAL HIGH (ref 3.5–5.3)
Sodium: 137 mmol/L (ref 135–146)

## 2021-09-03 MED ORDER — TRULICITY 0.75 MG/0.5ML ~~LOC~~ SOAJ: 0.7500 mg | SUBCUTANEOUS | 0 refills | Status: DC

## 2021-09-03 MED ORDER — TRULICITY 1.5 MG/0.5ML ~~LOC~~ SOAJ: 1.5000 mg | SUBCUTANEOUS | 6 refills | Status: DC

## 2021-09-03 MED ORDER — HYDROCHLOROTHIAZIDE 25 MG PO TABS: 25.0000 mg | ORAL_TABLET | Freq: Every day | ORAL | 3 refills | Status: DC

## 2021-09-03 MED ORDER — SODIUM POLYSTYRENE SULFONATE 15 GM/60ML PO SUSP: 15.0000 g | Freq: Once | ORAL | 0 refills | Status: AC

## 2021-09-07 ENCOUNTER — Other Ambulatory Visit: Payer: BC Managed Care – PPO

## 2021-09-07 ENCOUNTER — Other Ambulatory Visit: Payer: Self-pay

## 2021-09-07 DIAGNOSIS — E875 Hyperkalemia: Secondary | ICD-10-CM | POA: Diagnosis not present

## 2021-09-08 LAB — BASIC METABOLIC PANEL
BUN/Creatinine Ratio: 18 (calc) (ref 6–22)
BUN: 19 mg/dL (ref 7–25)
CO2: 27 mmol/L (ref 20–32)
Calcium: 9.7 mg/dL (ref 8.6–10.4)
Chloride: 103 mmol/L (ref 98–110)
Creat: 1.07 mg/dL — ABNORMAL HIGH (ref 0.50–1.05)
Glucose, Bld: 159 mg/dL — ABNORMAL HIGH (ref 65–99)
Potassium: 5.2 mmol/L (ref 3.5–5.3)
Sodium: 139 mmol/L (ref 135–146)

## 2021-09-18 ENCOUNTER — Encounter: Payer: Self-pay | Admitting: Family Medicine

## 2021-09-19 DIAGNOSIS — Z1231 Encounter for screening mammogram for malignant neoplasm of breast: Secondary | ICD-10-CM | POA: Diagnosis not present

## 2021-09-19 DIAGNOSIS — Z6839 Body mass index (BMI) 39.0-39.9, adult: Secondary | ICD-10-CM | POA: Diagnosis not present

## 2021-09-19 DIAGNOSIS — Z01419 Encounter for gynecological examination (general) (routine) without abnormal findings: Secondary | ICD-10-CM | POA: Diagnosis not present

## 2021-09-19 MED ORDER — FREESTYLE LIBRE 14 DAY SENSOR MISC: 3 refills | Status: DC

## 2021-09-19 MED ORDER — FREESTYLE LIBRE 14 DAY READER DEVI: 1 refills | Status: DC

## 2021-10-09 ENCOUNTER — Ambulatory Visit: Payer: BC Managed Care – PPO | Admitting: Family Medicine

## 2021-10-09 ENCOUNTER — Other Ambulatory Visit: Payer: Self-pay

## 2021-10-09 ENCOUNTER — Encounter: Payer: Self-pay | Admitting: Family Medicine

## 2021-10-09 VITALS — BP 128/82 | HR 82 | Temp 97.6°F | Resp 18 | Ht 67.0 in | Wt 241.0 lb

## 2021-10-09 DIAGNOSIS — R0609 Other forms of dyspnea: Secondary | ICD-10-CM | POA: Diagnosis not present

## 2021-10-09 DIAGNOSIS — R112 Nausea with vomiting, unspecified: Secondary | ICD-10-CM | POA: Diagnosis not present

## 2021-10-09 DIAGNOSIS — E1169 Type 2 diabetes mellitus with other specified complication: Secondary | ICD-10-CM | POA: Diagnosis not present

## 2021-10-09 DIAGNOSIS — R0600 Dyspnea, unspecified: Secondary | ICD-10-CM | POA: Diagnosis not present

## 2021-10-09 DIAGNOSIS — Z8673 Personal history of transient ischemic attack (TIA), and cerebral infarction without residual deficits: Secondary | ICD-10-CM | POA: Diagnosis not present

## 2021-10-09 NOTE — Progress Notes (Signed)
Subjective:    Patient ID: Caitlyn [name], female    DOB: [birth year], 66 y.o.   MRN: [id number]  HPI Patient presents today reporting several months of nausea and vomiting.  She states she becomes nauseated since she eats and occasionally she has to vomit.  She denies any melena or hematochezia.  She denies any fevers or chills.  She denies any constipation.  She denies any right upper quadrant pain.  She denies any heartburn or indigestion.  She is not sure if the nausea and vomiting began around the same time she started Trulicity in September when her A1c was 8.4.  Her husband also reports dyspnea on exertion.  He states that the patient will have to take a break simply walking from the car into the house and catch her breath.  He states that she will turn it "as white as a sheet" and that she will have to rest to catch her breath.  She denies any angina.  She denies any chest pain.  She denies any orthopnea.  She does report dyspnea on exertion Past Medical History:  Diagnosis Date   Asthma    Cold and cough --- no fever   Complication of anesthesia    24 yrs ago post tubal-- pulm. edema  (no problems post bowel surg. 2009)   Depression    Diabetes mellitus    oral med   Diabetes mellitus without complication ([place])    Phreesia 11/28/2020   Diverticulitis hx  -- w/ perf. bowel   s/p resection 2009   Hypertension    Psoriasis    Reflux occasional   watches diet   Past Surgical History:  Procedure Laterality Date   BOWEL RESECTION  2009   diverticulitis w/ perf. bowel   COLON SURGERY N/A    Phreesia 11/28/2020   HYSTEROSCOPY  10/09/2011   Procedure: HYSTEROSCOPY;  Surgeon: Margarette Asal;  Location: [facility];  Service: Gynecology;;  HYSTEROSCOPY D & C   TUBAL LIGATION  24 yrs ago   Current Outpatient Medications on File Prior to Visit  Medication Sig Dispense Refill   albuterol (VENTOLIN HFA) 108 (90 Base) MCG/ACT inhaler Inhale 2 puffs into the lungs every  4 (four) hours as needed for wheezing or shortness of breath. 8 g 2   amLODipine (NORVASC) 10 MG tablet TAKE 1 TABLET(10 MG) BY MOUTH DAILY (Patient taking differently: Take 10 mg by mouth daily.) 90 tablet 3   aspirin EC 81 MG tablet Take 1 tablet (81 mg total) by mouth daily. Swallow whole. (Patient not taking: Reported on 09/01/2021) 30 tablet 11   Blood Glucose Monitoring Suppl (BLOOD GLUCOSE SYSTEM PAK) KIT Use as directed to monitor FSBS 4x daily. Dx: E11.65. 1 kit 1   Continuous Blood Gluc Receiver (FREESTYLE LIBRE 14 DAY READER) DEVI Use as directed to monitor glucose continuously. Change sensor Q 14 days. Dx: E11.9. 1 each 1   Continuous Blood Gluc Sensor (FREESTYLE LIBRE 14 DAY SENSOR) MISC Use as directed to monitor glucose continuously. Change sensor Q 14 days. Dx: E11.9. 6 each 3   COSENTYX SENSOREADY 300 DOSE 150 MG/ML SOAJ Inject 150 mg into the muscle every 30 (thirty) days.     Dulaglutide (TRULICITY) 2.77 OE/4.2PN SOPN Inject 0.75 mg into the skin once a week. X4 weeks, then D/C. 2 mL 0   Dulaglutide (TRULICITY) 1.5 TI/1.4ER SOPN Inject 1.5 mg into the skin once a week. Begin after Trulicity 1.54MG taper. 2 mL 6  fexofenadine (ALLEGRA) 180 MG tablet Take 180 mg by mouth at bedtime.     Glucose Blood (BLOOD GLUCOSE TEST STRIPS) STRP Use as directed to monitor FSBS 4x daily. Dx: E11.65. 100 strip 1   hydrochlorothiazide (HYDRODIURIL) 25 MG tablet Take 1 tablet (25 mg total) by mouth daily. 90 tablet 3   Lancets MISC Use as directed to monitor FSBS 4x daily. Dx: E11.65. 100 each 1   metFORMIN (GLUCOPHAGE) 1000 MG tablet TAKE 1 TABLET(1000 MG) BY MOUTH TWICE DAILY WITH A MEAL (Patient taking differently: Take 1,000 mg by mouth 2 (two) times daily with a meal.) 180 tablet 3   metoprolol succinate (TOPROL-XL) 25 MG 24 hr tablet Take 1 tablet (25 mg total) by mouth daily. 90 tablet 3   montelukast (SINGULAIR) 10 MG tablet TAKE 1 TABLET(10 MG) BY MOUTH AT BEDTIME (Patient taking differently:  Take 10 mg by mouth at bedtime.) 90 tablet 3   montelukast (SINGULAIR) 10 MG tablet 1 tablet in the evening     venlafaxine XR (EFFEXOR-XR) 150 MG 24 hr capsule TAKE 1 CAPSULE BY MOUTH EVERY DAY 90 capsule 3   No current facility-administered medications on file prior to visit.   Allergies  Allergen Reactions   Erythromycin Other (See Comments)    Gi upset/ cramps Other reaction(s): Unknown   Hydrochlorothiazide     CAUSED GOUT  Advised to drink plenty of fluids to avoid crystallization in joints.    Macrolides And Ketolides Other (See Comments)    unknown   Social History   Socioeconomic History   Marital status: Married    Spouse name: Not on file   Number of children: Not on file   Years of education: Not on file   Highest education level: Not on file  Occupational History   Occupation: accountant  Tobacco Use   Smoking status: Never   Smokeless tobacco: Never  Vaping Use   Vaping Use: Never used  Substance and Sexual Activity   Alcohol use: No   Drug use: No   Sexual activity: Not on file  Other Topics Concern   Not on file  Social History Narrative   Lives with her husband   1-2 cups daily of Caffeine    Social Determinants of Health   Financial Resource Strain: Not on file  Food Insecurity: Not on file  Transportation Needs: Not on file  Physical Activity: Not on file  Stress: Not on file  Social Connections: Not on file  Intimate Partner Violence: Not on file      Review of Systems     Objective:   Physical Exam Vitals reviewed.  Constitutional:      General: She is not in acute distress.    Appearance: Normal appearance. She is obese. She is not ill-appearing or toxic-appearing.  Cardiovascular:     Rate and Rhythm: Normal rate and regular rhythm.     Heart sounds: Normal heart sounds. No murmur heard.   No friction rub. No gallop.  Pulmonary:     Effort: Pulmonary effort is normal. No respiratory distress.     Breath sounds: Normal  breath sounds. No stridor. No wheezing or rales.  Musculoskeletal:     Left wrist: No crepitus.  Neurological:     Mental Status: She is alert.   Hemoglobin A1c was 8.4 in 9/22      Assessment & Plan:  Type 2 diabetes mellitus with other specified complication, unspecified whether long term insulin use (HCC)  Dyspnea on exertion -  Plan: Ambulatory referral to Cardiology, CBC with Differential/Platelet, COMPLETE METABOLIC PANEL WITH GFR, Brain natriuretic peptide  Nausea and vomiting, unspecified vomiting type Regarding the nausea and vomiting, I suspect that the patient is likely having side effects from the Trulicity.  I recommended that she hold this for the next 2 weeks and then see if the symptoms improve.  Also on the differential diagnosis would be gastroparesis due to her diabetes, peptic ulcer disease, adhesions from her previous colon resection causingpartial bowel obstruction, and obstructive mass, and less likely biliary dyskinesia.  If symptoms do not improve off Trulicity we will pursue further diagnostic work-up.  I am concerned that the dyspnea on exertion is likely an anginal equivalent given the patient's age gender and medical comorbidities.  Therefore I recommended a cardiology consultation for stress test.  Meanwhile I will check a CBC CMP and a BNP

## 2021-10-10 LAB — COMPLETE METABOLIC PANEL WITH GFR
AG Ratio: 1.6 (calc) (ref 1.0–2.5)
ALT: 19 U/L (ref 6–29)
AST: 19 U/L (ref 10–35)
Albumin: 4.5 g/dL (ref 3.6–5.1)
Alkaline phosphatase (APISO): 59 U/L (ref 37–153)
BUN/Creatinine Ratio: 24 (calc) — ABNORMAL HIGH (ref 6–22)
BUN: 30 mg/dL — ABNORMAL HIGH (ref 7–25)
CO2: 25 mmol/L (ref 20–32)
Calcium: 9.8 mg/dL (ref 8.6–10.4)
Chloride: 102 mmol/L (ref 98–110)
Creat: 1.23 mg/dL — ABNORMAL HIGH (ref 0.50–1.05)
Globulin: 2.8 g/dL (calc) (ref 1.9–3.7)
Glucose, Bld: 150 mg/dL — ABNORMAL HIGH (ref 65–99)
Potassium: 5.5 mmol/L — ABNORMAL HIGH (ref 3.5–5.3)
Sodium: 137 mmol/L (ref 135–146)
Total Bilirubin: 0.5 mg/dL (ref 0.2–1.2)
Total Protein: 7.3 g/dL (ref 6.1–8.1)
eGFR: 49 mL/min/{1.73_m2} — ABNORMAL LOW (ref >=60)

## 2021-10-10 LAB — CBC WITH DIFFERENTIAL/PLATELET
Absolute Monocytes: 511 cells/uL (ref 200–950)
Basophils Absolute: 50 cells/uL (ref 0–200)
Basophils Relative: 0.7 %
Eosinophils Absolute: 281 cells/uL (ref 15–500)
Eosinophils Relative: 3.9 %
HCT: 39.4 % (ref 35.0–45.0)
Hemoglobin: 13.5 g/dL (ref 11.7–15.5)
Lymphs Abs: 1764 cells/uL (ref 850–3900)
MCH: 30.2 pg (ref 27.0–33.0)
MCHC: 34.3 g/dL (ref 32.0–36.0)
MCV: 88.1 fL (ref 80.0–100.0)
MPV: 10.2 fL (ref 7.5–12.5)
Monocytes Relative: 7.1 %
Neutro Abs: 4594 cells/uL (ref 1500–7800)
Neutrophils Relative %: 63.8 %
Platelets: 374 10*3/uL (ref 140–400)
RBC: 4.47 10*6/uL (ref 3.80–5.10)
RDW: 12.1 % (ref 11.0–15.0)
Total Lymphocyte: 24.5 %
WBC: 7.2 10*3/uL (ref 3.8–10.8)

## 2021-10-10 LAB — BRAIN NATRIURETIC PEPTIDE: Brain Natriuretic Peptide: 25 pg/mL (ref <100)

## 2021-10-29 ENCOUNTER — Encounter: Payer: Self-pay | Admitting: Family Medicine

## 2021-10-31 ENCOUNTER — Telehealth: Payer: BC Managed Care – PPO | Admitting: Nurse Practitioner

## 2021-10-31 ENCOUNTER — Other Ambulatory Visit: Payer: Self-pay

## 2021-10-31 ENCOUNTER — Encounter: Payer: Self-pay | Admitting: Nurse Practitioner

## 2021-10-31 DIAGNOSIS — J019 Acute sinusitis, unspecified: Secondary | ICD-10-CM | POA: Diagnosis not present

## 2021-10-31 DIAGNOSIS — B9689 Other specified bacterial agents as the cause of diseases classified elsewhere: Secondary | ICD-10-CM

## 2021-10-31 DIAGNOSIS — J069 Acute upper respiratory infection, unspecified: Secondary | ICD-10-CM | POA: Diagnosis not present

## 2021-10-31 MED ORDER — AMOXICILLIN-POT CLAVULANATE 875-125 MG PO TABS: 1.0000 | ORAL_TABLET | Freq: Two times a day (BID) | ORAL | 0 refills | Status: AC

## 2021-10-31 MED ORDER — FLUTICASONE PROPIONATE 50 MCG/ACT NA SUSP
2.0000 | Freq: Every day | NASAL | 6 refills | Status: DC
Start: 2021-10-31 — End: 2021-11-30

## 2021-10-31 MED ORDER — SITAGLIPTIN PHOSPHATE 100 MG PO TABS: 100.0000 mg | ORAL_TABLET | Freq: Every day | ORAL | 3 refills | Status: DC

## 2021-10-31 MED ORDER — SALINE SPRAY 0.65 % NA SOLN
1.0000 | NASAL | 0 refills | Status: DC | PRN
Start: 2021-10-31 — End: 2021-11-30

## 2021-10-31 NOTE — Progress Notes (Signed)
Subjective:    Patient ID: Caitlyn [name], female    DOB: 05/07/1956, 66 y.o.   MRN: [id number]  HPI: Caitlyn [name] is a 65 y.o. female presenting virtually for cough and congestion.  Chief Complaint  Patient presents with   Sinusitis   URI   UPPER RESPIRATORY TRACT INFECTION Reports symptoms have improved last couple days, however woke up today feeling much worse. Onset: 6 days Fever: no Body aches: no Chills: no Cough: yes; productive; thick/green mucus thinks from draining down back of throat Shortness of breath: no Wheezing: no; has not had to use albuterol inhaler at all Chest pain: no Chest tightness: no Chest congestion: no Nasal congestion: yes Runny nose: yes Post nasal drip: yes Sneezing: yes Sore throat: yes Swollen glands: yes Sinus pressure: yes Headache: yes Face pain: yes; right maxillary Toothache: no Ear pain:  right ear   Ear pressure: no  Eyes red/itching:no Eye drainage/crusting: no  Nausea: no  Vomiting: no Diarrhea: no  Change in appetite: no  Loss of taste/smell: no  Rash: no Fatigue: yes Sick contacts: no; grandson had cold recently, does not remember when Strep contacts: no  Context: fluctuating Recurrent sinusitis: no Treatments attempted: Allegra, Alka seltzer cold plus, Mucinex, vicks cough syrup Relief with OTC medications: some   Allergies  Allergen Reactions   Erythromycin Other (See Comments)    Gi upset/ cramps Other reaction(s): Unknown   Hydrochlorothiazide     CAUSED GOUT  Advised to drink plenty of fluids to avoid crystallization in joints.    Pollen Extract    Macrolides And Ketolides Other (See Comments)    unknown    Outpatient Encounter Medications as of 10/31/2021  Medication Sig   albuterol (VENTOLIN HFA) 108 (90 Base) MCG/ACT inhaler Inhale 2 puffs into the lungs every 4 (four) hours as needed for wheezing or shortness of breath.   amLODipine (NORVASC) 10 MG tablet TAKE 1 TABLET(10 MG) BY MOUTH DAILY  (Patient taking differently: Take 10 mg by mouth daily.)   aspirin EC 81 MG tablet Take 1 tablet (81 mg total) by mouth daily. Swallow whole.   Blood Glucose Monitoring Suppl (BLOOD GLUCOSE SYSTEM PAK) KIT Use as directed to monitor FSBS 4x daily. Dx: E11.65.   Continuous Blood Gluc Receiver (FREESTYLE LIBRE 14 DAY READER) DEVI Use as directed to monitor glucose continuously. Change sensor Q 14 days. Dx: E11.9.   Continuous Blood Gluc Sensor (FREESTYLE LIBRE 14 DAY SENSOR) MISC Use as directed to monitor glucose continuously. Change sensor Q 14 days. Dx: E11.9.   COSENTYX SENSOREADY 300 DOSE 150 MG/ML SOAJ Inject 150 mg into the muscle every 30 (thirty) days.   fexofenadine (ALLEGRA) 180 MG tablet Take 180 mg by mouth at bedtime.   Glucose Blood (BLOOD GLUCOSE TEST STRIPS) STRP Use as directed to monitor FSBS 4x daily. Dx: E11.65.   hydrochlorothiazide (HYDRODIURIL) 25 MG tablet Take 1 tablet (25 mg total) by mouth daily.   Lancets MISC Use as directed to monitor FSBS 4x daily. Dx: E11.65.   metFORMIN (GLUCOPHAGE) 1000 MG tablet TAKE 1 TABLET(1000 MG) BY MOUTH TWICE DAILY WITH A MEAL (Patient taking differently: Take 1,000 mg by mouth 2 (two) times daily with a meal.)   montelukast (SINGULAIR) 10 MG tablet TAKE 1 TABLET(10 MG) BY MOUTH AT BEDTIME (Patient taking differently: Take 10 mg by mouth at bedtime.)   sitaGLIPtin (JANUVIA) 100 MG tablet Take 1 tablet (100 mg total) by mouth daily.   venlafaxine XR (EFFEXOR-XR) 150  MG 24 hr capsule TAKE 1 CAPSULE BY MOUTH EVERY DAY   [DISCONTINUED] metoprolol succinate (TOPROL-XL) 25 MG 24 hr tablet Take 1 tablet (25 mg total) by mouth daily.   No facility-administered encounter medications on file as of 10/31/2021.    Patient Active Problem List   Diagnosis Date Noted   Type 2 diabetes mellitus ([place]) 03/23/2021   Left facial numbness 03/22/2021   Cerebral thrombosis with cerebral infarction 03/22/2021   Cerebral embolism with cerebral infarction  03/22/2021   Macular pucker, right eye 02/13/2021   Crohn disease ([place])    Psoriasis    Type II diabetes mellitus, uncontrolled 09/27/2016   Hx of Crohn's disease 09/27/2016   Asthma 09/27/2016   Hypertension associated with diabetes ([place]) 09/27/2016    Past Medical History:  Diagnosis Date   Asthma    Cold and cough --- no fever   Complication of anesthesia    24 yrs ago post tubal-- pulm. edema  (no problems post bowel surg. 2009)   Depression    Diabetes mellitus    oral med   Diabetes mellitus without complication ([place])    Phreesia 11/28/2020   Diverticulitis hx  -- w/ perf. bowel   s/p resection 2009   Hypertension    Psoriasis    Reflux occasional   watches diet    Relevant past medical, surgical, family and social history reviewed and updated as indicated. Interim medical history since our last visit reviewed.  Review of Systems Per HPI unless specifically indicated above     Objective:    There were no vitals taken for this visit.  Wt Readings from Last 3 Encounters:  10/09/21 241 lb (109.3 kg)  09/01/21 250 lb (113.4 kg)  06/12/21 252 lb 8 oz (114.5 kg)    Physical Exam Nursing note reviewed.  Constitutional:      General: She is not in acute distress.    Appearance: Normal appearance. She is not ill-appearing, toxic-appearing or diaphoretic.  HENT:     Head: Normocephalic and atraumatic.     Right Ear: External ear normal.     Left Ear: External ear normal.     Nose: Congestion present. No rhinorrhea.     Mouth/Throat:     Mouth: Mucous membranes are moist.     Pharynx: Oropharynx is clear.  Eyes:     General: No scleral icterus.       Right eye: No discharge.        Left eye: No discharge.     Extraocular Movements: Extraocular movements intact.  Cardiovascular:     Comments: Unable to assess heart sounds via virtual visit. Pulmonary:     Effort: Pulmonary effort is normal. No respiratory distress.     Comments: Unable to assess lung sounds  via virtual visit.  Patient talking in complete sentences during telemedicine visit.  No accessory muscle use. Skin:    Coloration: Skin is not jaundiced or pale.     Findings: No erythema.  Neurological:     Mental Status: She is alert and oriented to person, place, and time.  Psychiatric:        Mood and Affect: Mood normal.        Behavior: Behavior normal.        Thought Content: Thought content normal.        Judgment: Judgment normal.      Assessment & Plan:  1. Upper respiratory tract infection, unspecified type Acute.  Encouraged patient to continue supportive care  for ~2 more days.  Reassured patient that symptoms and exam findings are most consistent with a viral upper respiratory infection and explained lack of efficacy of antibiotics against viruses.  Discussed expected course and features suggestive of secondary bacterial infection.  Continue supportive care. Increase fluid intake with water or electrolyte solution like pedialyte. Encouraged acetaminophen as needed for fever/pain. Encouraged salt water gargling, chloraseptic spray and throat lozenges. Encouraged OTC guaifenesin. Encouraged saline sinus flushes and/or neti with humidified air.  If symptoms persist or do not improve in that timeframe, okay to start Augmentin for bacterial sinus infection.  - fluticasone (FLONASE) 50 MCG/ACT nasal spray; Place 2 sprays into both nostrils daily.  Dispense: 16 g; Refill: 6 - sodium chloride (OCEAN) 0.65 % SOLN nasal spray; Place 1 spray into both nostrils as needed for congestion.  Dispense: 88 mL; Refill: 0  2. Acute bacterial sinusitis Encouraged patient to start this medication with no improvement in the next couple of days or if her symptoms worsen.  - amoxicillin-clavulanate (AUGMENTIN) 875-125 MG tablet; Take 1 tablet by mouth 2 (two) times daily for 7 days.  Dispense: 14 tablet; Refill: 0    Follow up plan: Return if symptoms worsen or fail to improve.  Due to the  catastrophic nature of the COVID-19 pandemic, this video visit was completed soley via audio and visual contact via [facility] due to the restrictions of the COVID-19 pandemic.  All issues as above were discussed and addressed. Physical exam was done as above through visual confirmation on [facility]. If it was felt that the patient should be evaluated in the office, they were directed there. The patient verbally consented to this visit. Location of the patient: home Location of the provider: work Those involved with this call:  Provider: Noemi Chapel, DNP, FNP-C CMA: n/a Front Desk/Registration: Santina Evans  Time spent on call:  10 minutes with patient face to face via video conference. More than 50% of this time was spent in counseling and coordination of care. 15 minutes total spent in review of patient's record and preparation of their chart. I verified patient identity using two factors (patient name and date of birth). Patient consents verbally to being seen via telemedicine visit today.

## 2021-11-06 ENCOUNTER — Ambulatory Visit: Payer: BC Managed Care – PPO | Admitting: Internal Medicine

## 2021-11-07 NOTE — Addendum Note (Signed)
Addended by: Amado Coe on: 11/07/2021 12:27 PM   Modules accepted: Orders

## 2021-11-12 DIAGNOSIS — Z961 Presence of intraocular lens: Secondary | ICD-10-CM | POA: Diagnosis not present

## 2021-11-12 DIAGNOSIS — H5213 Myopia, bilateral: Secondary | ICD-10-CM | POA: Diagnosis not present

## 2021-11-12 DIAGNOSIS — E119 Type 2 diabetes mellitus without complications: Secondary | ICD-10-CM | POA: Diagnosis not present

## 2021-11-12 DIAGNOSIS — Z7984 Long term (current) use of oral hypoglycemic drugs: Secondary | ICD-10-CM | POA: Diagnosis not present

## 2021-11-12 LAB — HM DIABETES EYE EXAM

## 2021-11-21 ENCOUNTER — Other Ambulatory Visit: Payer: Self-pay

## 2021-11-21 DIAGNOSIS — E1169 Type 2 diabetes mellitus with other specified complication: Secondary | ICD-10-CM

## 2021-11-27 NOTE — Progress Notes (Signed)
Cardiology Office Note:    Date:  11/30/2021   ID:  Caitlyn [name], DOB 1956/01/04, MRN [id number]  PCP:  Susy Frizzle, MD   [facility] Providers Cardiologist:  Werner Lean, MD     Referring MD: Susy Frizzle, MD   CC: Trouble sleeping Consulted for the evaluation of DOE at the behest of Susy Frizzle, MD  History of Present Illness:    Caitlyn [name] is a 66 y.o. female with a hx of HTN with DM, Crohn's Disease, Aortic atherosclerosis, Asthma who presents with DOE.  Patient notes that she is feeling issues with her sleep.  She recounts a day that she has facial droop.  Had TIA symptoms at that time.  Now, patient has new shortness of breath.  When stands up, sometimes she feels sick on is stomach .    Has had no chest pain, chest pressure, chest tightness, chest stinging .  Discomfort occurs after prolonged standing, and improves with sitting down.  Fatigue has resolved.  Patient exertion notable for  with walking from the car to the house and feels out of breath.  No shortness of breath,but DOE.  No PND or orthopnea.  Notes intentional weight loss, Notes persistent leg swelling , no abdominal swelling.   Notes  no palpitations or funny heart beats.   No new medication recently.  Ambulatory BP 120/80.    Past Medical History:  Diagnosis Date   Asthma    Cold and cough --- no fever   Complication of anesthesia    24 yrs ago post tubal-- pulm. edema  (no problems post bowel surg. 2009)   Depression    Diabetes mellitus    oral med   Diabetes mellitus without complication ([place])    Phreesia 11/28/2020   Diverticulitis hx  -- w/ perf. bowel   s/p resection 2009   Hypertension    Psoriasis    Reflux occasional   watches diet    Past Surgical History:  Procedure Laterality Date   BOWEL RESECTION  2009   diverticulitis w/ perf. bowel   COLON SURGERY N/A    Phreesia 11/28/2020   HYSTEROSCOPY  10/09/2011   Procedure: HYSTEROSCOPY;  Surgeon: Margarette Asal;  Location: [facility];  Service: Gynecology;;  HYSTEROSCOPY D & C   TUBAL LIGATION  24 yrs ago    Current Medications: Current Meds  Medication Sig   albuterol (VENTOLIN HFA) 108 (90 Base) MCG/ACT inhaler Inhale 2 puffs into the lungs every 4 (four) hours as needed for wheezing or shortness of breath.   amLODipine (NORVASC) 10 MG tablet TAKE 1 TABLET(10 MG) BY MOUTH DAILY   aspirin EC 81 MG tablet Take 1 tablet (81 mg total) by mouth daily. Swallow whole.   Blood Glucose Monitoring Suppl (BLOOD GLUCOSE SYSTEM PAK) KIT Use as directed to monitor FSBS 4x daily. Dx: E11.65.   COSENTYX SENSOREADY 300 DOSE 150 MG/ML SOAJ Inject 150 mg into the muscle every 30 (thirty) days.   fexofenadine (ALLEGRA) 180 MG tablet Take 180 mg by mouth at bedtime.   Glucose Blood (BLOOD GLUCOSE TEST STRIPS) STRP Use as directed to monitor FSBS 4x daily. Dx: E11.65.   hydrochlorothiazide (HYDRODIURIL) 25 MG tablet Take 1 tablet (25 mg total) by mouth daily.   metFORMIN (GLUCOPHAGE) 1000 MG tablet TAKE 1 TABLET(1000 MG) BY MOUTH TWICE DAILY WITH A MEAL   metoprolol tartrate (LOPRESSOR) 100 MG tablet Take 2 hours prior to Cardiac CT  montelukast (SINGULAIR) 10 MG tablet TAKE 1 TABLET(10 MG) BY MOUTH AT BEDTIME   venlafaxine XR (EFFEXOR-XR) 150 MG 24 hr capsule TAKE 1 CAPSULE BY MOUTH EVERY DAY   [DISCONTINUED] Continuous Blood Gluc Receiver (FREESTYLE LIBRE 14 DAY READER) DEVI Use as directed to monitor glucose continuously. Change sensor Q 14 days. Dx: E11.9.   [DISCONTINUED] Continuous Blood Gluc Sensor (FREESTYLE LIBRE 14 DAY SENSOR) MISC Use as directed to monitor glucose continuously. Change sensor Q 14 days. Dx: E11.9.   [DISCONTINUED] fluticasone (FLONASE) 50 MCG/ACT nasal spray Place 2 sprays into both nostrils daily.   [DISCONTINUED] Lancets MISC Use as directed to monitor FSBS 4x daily. Dx: E11.65.   [DISCONTINUED] sitaGLIPtin (JANUVIA) 100 MG tablet Take 1 tablet (100 mg total) by  mouth daily.   [DISCONTINUED] sodium chloride (OCEAN) 0.65 % SOLN nasal spray Place 1 spray into both nostrils as needed for congestion.     Allergies:   Erythromycin, Hydrochlorothiazide, Pollen extract, and Macrolides and ketolides   Social History   Socioeconomic History   Marital status: Married    Spouse name: Not on file   Number of children: Not on file   Years of education: Not on file   Highest education level: Not on file  Occupational History   Occupation: accountant  Tobacco Use   Smoking status: Never   Smokeless tobacco: Never  Vaping Use   Vaping Use: Never used  Substance and Sexual Activity   Alcohol use: No   Drug use: No   Sexual activity: Not on file  Other Topics Concern   Not on file  Social History Narrative   Lives with her husband   1-2 cups daily of Caffeine    Social Determinants of Health   Financial Resource Strain: Not on file  Food Insecurity: Not on file  Transportation Needs: Not on file  Physical Activity: Not on file  Stress: Not on file  Social Connections: Not on file    Social:  retired from [employer] in 2022  Family History: The patient's family history includes Diabetes in her brother; Stroke in her father. History of coronary artery disease notable for no members. History of heart failure notable for no members. History of arrhythmia notable for no members.  ROS:   Please see the history of present illness.     All other systems reviewed and are negative.  EKGs/Labs/Other Studies Reviewed:    The following studies were reviewed today:  EKG:  EKG is  ordered today.  The ekg ordered today demonstrates  11/30/21: SR rate 90 LVH  Transthoracic Echocardiogram: Date: 09/01/2015 Results: Report only Study Conclusions   - Left ventricle: The cavity size was normal. Wall thickness was    normal. Systolic function was normal. The estimated ejection    fraction was in the range of 50% to 55%. Wall motion was normal;     there were no regional wall motion abnormalities. Doppler    parameters are consistent with abnormal left ventricular    relaxation (grade 1 diastolic dysfunction).  CTPE: Date: 08/23/2015 Results: Aortic Atherosclerosis No PE   Recent Labs: 10/09/2021: ALT 19; Brain Natriuretic Peptide 25; BUN 30; Creat 1.23; Hemoglobin 13.5; Platelets 374; Potassium 5.5; Sodium 137  Recent Lipid Panel    Component Value Date/Time   CHOL 169 08/21/2021 1230   TRIG 123 08/21/2021 1230   HDL 51 08/21/2021 1230   CHOLHDL 3.3 08/21/2021 1230   VLDL 18 03/22/2021 1943   LDLCALC 96 08/21/2021 1230  Physical Exam:    VS:  Pulse 90    Ht _0  (1.676 m)    Wt 108.9 kg    SpO2 97%    BMI 38.74 kg/m     BP 120/80  Wt Readings from Last 3 Encounters:  11/30/21 108.9 kg  10/09/21 109.3 kg  09/01/21 113.4 kg    Gen: No distress, Morbid obesity  Neck: No JVD,  Cardiac: No Rubs or Gallops, no Murmur, regular +2 radial pulses Respiratory: Clear to auscultation bilaterally, normal effort, normal  respiratory rate GI: Soft, nontender, non-distended  MS:  edema;  moves all extremities Integument: Skin feels warm Neuro:  At time of evaluation, alert and oriented to person/place/time/situation  Psych: Normal affect, patient feels OK    ASSESSMENT:    1. Hypertension associated with diabetes ([place])   2. DOE (dyspnea on exertion)   3. Aortic atherosclerosis (H[facility])    PLAN:    DOE HTN with DM Asthma Aortic Atherosclerosis Prior TIA (possible X2) 02/2021 - Given risk factors, rule out CAD, she is not a treadmill candidate, and has asthma, would favor [facility] over [place] (we discussed both) - will repeat echo - no change to her ASA or BP meds - Based on results, may do CPET in f/u (Deconditioning and lung disease is on DDX).  Three to four months with me       Medication Adjustments/Labs and Tests Ordered: Current medicines are reviewed at length with the patient today.  Concerns regarding  medicines are outlined above.  Orders Placed This Encounter  Procedures   CT CORONARY MORPH W/CTA COR W/SCORE W/CA W/CM &/OR WO/CM   Basic metabolic panel   EKG [id number]   ECHOCARDIOGRAM COMPLETE   Meds ordered this encounter  Medications   metoprolol tartrate (LOPRESSOR) 100 MG tablet    Sig: Take 2 hours prior to Cardiac CT    Dispense:  1 tablet    Refill:  0    Patient Instructions  Medication Instructions:  Your physician recommends that you continue on your current medications as directed. Please refer to the Current Medication list given to you today.  *If you need a refill on your cardiac medications before your next appointment, please call your pharmacy*   Lab Work: TODAY: BMP If you have labs (blood work) drawn today and your tests are completely normal, you will receive your results only by: [place] (if you have MyChart) OR A paper copy in the mail If you have any lab test that is abnormal or we need to change your treatment, we will call you to review the results.   Testing/Procedures: Your physician has requested that you have an echocardiogram. Echocardiography is a painless test that uses sound waves to create images of your heart. It provides your doctor with information about the size and shape of your heart and how well your hearts chambers and valves are working. This procedure takes approximately one hour. There are no restrictions for this procedure.  Your physician has requested that you have cardiac CT. Cardiac computed tomography (CT) is a painless test that uses an x-ray machine to take clear, detailed pictures of your heart. For further information please visit HugeFiesta.tn. Please follow instruction sheet as given.     Follow-Up: At [facility], you and your health needs are our priority.  As part of our continuing mission to provide you with exceptional heart care, we have created designated Provider Care Teams.  These Care Teams  include your primary  Cardiologist (physician) and Advanced Practice Providers (APPs -  Physician Assistants and Nurse Practitioners) who all work together to provide you with the care you need, when you need it.    Your next appointment:   3 month(s)  The format for your next appointment:   In Person  Provider:   Werner Lean, MD      Other Instructions  Your cardiac CT will be scheduled at one the below location:   [facility] [place], [place] [address] [phone]  At [facility], please arrive at the [facility] main entrance (entrance A) of [facility] 30 minutes prior to test start time. You can use the FREE valet parking offered at the main entrance (encouraged to control the heart rate for the test) Proceed to the [facility] Radiology Department (first floor) to check-in and test prep.   Please follow these instructions carefully (unless otherwise directed):   On the Night Before the Test: Be sure to Drink plenty of water. Do not consume any caffeinated/decaffeinated beverages or chocolate 12 hours prior to your test. Do not take any antihistamines 12 hours prior to your test.(Allegra and Singular)   On the Day of the Test: Drink plenty of water until 1 hour prior to the test. Do not eat any food 4 hours prior to the test. You may take your regular medications prior to the test.  Take metoprolol (Lopressor) 100 mg two hours prior to test. HOLD Hydrochlorothiazide morning of the test. FEMALES- please wear underwire-free bra if available, avoid dresses & tight clothing        After the Test: Drink plenty of water. After receiving IV contrast, you may experience a mild flushed feeling. This is normal. On occasion, you may experience a mild rash up to 24 hours after the test. This is not dangerous. If this occurs, you can take Benadryl 25 mg and increase your fluid intake. If you experience trouble breathing,  this can be serious. If it is severe call 911 IMMEDIATELY. If it is mild, please call our office. If you take any of these medications: Metformin please do not take 48 hours after completing test unless otherwise instructed.  Please allow 2-4 weeks for scheduling of routine cardiac CTs. Some insurance companies require a pre-authorization which may delay scheduling of this test.   For non-scheduling related questions, please contact the cardiac imaging nurse navigator should you have any questions/concerns: Marchia Bond, Cardiac Imaging Nurse Navigator Gordy Clement, Cardiac Imaging Nurse Navigator  Heart and Vascular Services Direct Office Dial: [phone]   For scheduling needs, including cancellations and rescheduling, please call [place], [phone].     Signed, Werner Lean, MD  11/30/2021 9:24 AM    [place]

## 2021-11-30 ENCOUNTER — Telehealth (INDEPENDENT_AMBULATORY_CARE_PROVIDER_SITE_OTHER): Payer: Medicare Other | Admitting: Nurse Practitioner

## 2021-11-30 ENCOUNTER — Encounter: Payer: Self-pay | Admitting: Nurse Practitioner

## 2021-11-30 ENCOUNTER — Encounter: Payer: Self-pay | Admitting: Internal Medicine

## 2021-11-30 ENCOUNTER — Ambulatory Visit (INDEPENDENT_AMBULATORY_CARE_PROVIDER_SITE_OTHER): Payer: Medicare Other | Admitting: Internal Medicine

## 2021-11-30 ENCOUNTER — Other Ambulatory Visit: Payer: Self-pay

## 2021-11-30 VITALS — HR 90 | Ht 66.0 in | Wt 240.0 lb

## 2021-11-30 DIAGNOSIS — R0609 Other forms of dyspnea: Secondary | ICD-10-CM

## 2021-11-30 DIAGNOSIS — R300 Dysuria: Secondary | ICD-10-CM | POA: Diagnosis not present

## 2021-11-30 DIAGNOSIS — E1159 Type 2 diabetes mellitus with other circulatory complications: Secondary | ICD-10-CM

## 2021-11-30 DIAGNOSIS — I152 Hypertension secondary to endocrine disorders: Secondary | ICD-10-CM | POA: Diagnosis not present

## 2021-11-30 DIAGNOSIS — I700 Atherosclerosis of aorta: Secondary | ICD-10-CM

## 2021-11-30 LAB — URINALYSIS, ROUTINE W REFLEX MICROSCOPIC
Bilirubin Urine: NEGATIVE
Glucose, UA: NEGATIVE
Hyaline Cast: NONE SEEN /LPF
Nitrite: NEGATIVE
Specific Gravity, Urine: 1.022 (ref 1.001–1.035)
pH: 5.5 (ref 5.0–8.0)

## 2021-11-30 LAB — BASIC METABOLIC PANEL
BUN/Creatinine Ratio: 22 (ref 12–28)
BUN: 26 mg/dL (ref 8–27)
CO2: 23 mmol/L (ref 20–29)
Calcium: 10.3 mg/dL (ref 8.7–10.3)
Chloride: 101 mmol/L (ref 96–106)
Creatinine, Ser: 1.17 mg/dL — ABNORMAL HIGH (ref 0.57–1.00)
Glucose: 116 mg/dL — ABNORMAL HIGH (ref 70–99)
Potassium: 5.7 mmol/L — ABNORMAL HIGH (ref 3.5–5.2)
Sodium: 138 mmol/L (ref 134–144)
eGFR: 52 mL/min/{1.73_m2} — ABNORMAL LOW (ref >59)

## 2021-11-30 LAB — HM DIABETES EYE EXAM

## 2021-11-30 LAB — MICROSCOPIC MESSAGE

## 2021-11-30 MED ORDER — METOPROLOL TARTRATE 100 MG PO TABS: ORAL_TABLET | ORAL | 0 refills | Status: DC

## 2021-11-30 MED ORDER — CEPHALEXIN 500 MG PO CAPS: 500.0000 mg | ORAL_CAPSULE | Freq: Four times a day (QID) | ORAL | 0 refills | Status: AC

## 2021-11-30 NOTE — Patient Instructions (Addendum)
Medication Instructions:  Your physician recommends that you continue on your current medications as directed. Please refer to the Current Medication list given to you today.  *If you need a refill on your cardiac medications before your next appointment, please call your pharmacy*   Lab Work: TODAY: BMP If you have labs (blood work) drawn today and your tests are completely normal, you will receive your results only by: [place] (if you have MyChart) OR A paper copy in the mail If you have any lab test that is abnormal or we need to change your treatment, we will call you to review the results.   Testing/Procedures: Your physician has requested that you have an echocardiogram. Echocardiography is a painless test that uses sound waves to create images of your heart. It provides your doctor with information about the size and shape of your heart and how well your hearts chambers and valves are working. This procedure takes approximately one hour. There are no restrictions for this procedure.  Your physician has requested that you have cardiac CT. Cardiac computed tomography (CT) is a painless test that uses an x-ray machine to take clear, detailed pictures of your heart. For further information please visit HugeFiesta.tn. Please follow instruction sheet as given.     Follow-Up: At [facility], you and your health needs are our priority.  As part of our continuing mission to provide you with exceptional heart care, we have created designated Provider Care Teams.  These Care Teams include your primary Cardiologist (physician) and Advanced Practice Providers (APPs -  Physician Assistants and Nurse Practitioners) who all work together to provide you with the care you need, when you need it.    Your next appointment:   3 month(s)  The format for your next appointment:   In Person  Provider:   Werner Lean, MD      Other Instructions  Your cardiac CT will be  scheduled at one the below location:   [facility] [place], [place] [address] [phone]  At [facility], please arrive at the [facility] main entrance (entrance A) of [facility] 30 minutes prior to test start time. You can use the FREE valet parking offered at the main entrance (encouraged to control the heart rate for the test) Proceed to the [facility] Radiology Department (first floor) to check-in and test prep.   Please follow these instructions carefully (unless otherwise directed):   On the Night Before the Test: Be sure to Drink plenty of water. Do not consume any caffeinated/decaffeinated beverages or chocolate 12 hours prior to your test. Do not take any antihistamines 12 hours prior to your test.(Allegra and Singular)   On the Day of the Test: Drink plenty of water until 1 hour prior to the test. Do not eat any food 4 hours prior to the test. You may take your regular medications prior to the test.  Take metoprolol (Lopressor) 100 mg two hours prior to test. HOLD Hydrochlorothiazide morning of the test. FEMALES- please wear underwire-free bra if available, avoid dresses & tight clothing        After the Test: Drink plenty of water. After receiving IV contrast, you may experience a mild flushed feeling. This is normal. On occasion, you may experience a mild rash up to 24 hours after the test. This is not dangerous. If this occurs, you can take Benadryl 25 mg and increase your fluid intake. If you experience trouble breathing,  this can be serious. If it is severe call 911 IMMEDIATELY. If it is mild, please call our office. If you take any of these medications: Metformin please do not take 48 hours after completing test unless otherwise instructed.  Please allow 2-4 weeks for scheduling of routine cardiac CTs. Some insurance companies require a pre-authorization which may delay scheduling of this test.   For  non-scheduling related questions, please contact the cardiac imaging nurse navigator should you have any questions/concerns: Marchia Bond, Cardiac Imaging Nurse Navigator Gordy Clement, Cardiac Imaging Nurse Navigator [place] [facility] Direct Office Dial: [phone]   For scheduling needs, including cancellations and rescheduling, please call [place], [phone].

## 2021-11-30 NOTE — Progress Notes (Signed)
Subjective:    Patient ID: Caitlyn [name], female    DOB: Apr 06, 1956, 66 y.o.   MRN: [id number]  HPI: Caitlyn [name] is a 66 y.o. female presenting virtually for UTI symptoms.    Chief Complaint  Patient presents with   Urinary Tract Infection   URINARY SYMPTOMS Duration: Monday/Tuesday Dysuria: yes Urinary frequency: yes Urgency: yes Small volume voids: yes Symptom severity: no Urinary incontinence: no Foul odor: yes Hematuria: no Abdominal pain: no Back pain: no Suprapubic pain/pressure: yes Flank pain: no Fever:  no Nausea: no Vomiting: no Relief with cranberry juice: no Relief with pyridium: no Status: better Previous urinary tract infection: yes - last in September Recurrent urinary tract infection: no Treatments attempted: increased water, cranberry juice  Allergies  Allergen Reactions   Erythromycin Other (See Comments)    Gi upset/ cramps Other reaction(s): Unknown   Hydrochlorothiazide     CAUSED GOUT  Advised to drink plenty of fluids to avoid crystallization in joints.    Pollen Extract    Macrolides And Ketolides Other (See Comments)    unknown    Outpatient Encounter Medications as of 11/30/2021  Medication Sig   cephALEXin (KEFLEX) 500 MG capsule Take 1 capsule (500 mg total) by mouth 4 (four) times daily for 5 days.   albuterol (VENTOLIN HFA) 108 (90 Base) MCG/ACT inhaler Inhale 2 puffs into the lungs every 4 (four) hours as needed for wheezing or shortness of breath.   amLODipine (NORVASC) 10 MG tablet TAKE 1 TABLET(10 MG) BY MOUTH DAILY   aspirin EC 81 MG tablet Take 1 tablet (81 mg total) by mouth daily. Swallow whole.   Blood Glucose Monitoring Suppl (BLOOD GLUCOSE SYSTEM PAK) KIT Use as directed to monitor FSBS 4x daily. Dx: E11.65.   COSENTYX SENSOREADY 300 DOSE 150 MG/ML SOAJ Inject 150 mg into the muscle every 30 (thirty) days.   fexofenadine (ALLEGRA) 180 MG tablet Take 180 mg by mouth at bedtime.   Glucose Blood (BLOOD GLUCOSE TEST  STRIPS) STRP Use as directed to monitor FSBS 4x daily. Dx: E11.65.   hydrochlorothiazide (HYDRODIURIL) 25 MG tablet Take 1 tablet (25 mg total) by mouth daily.   metFORMIN (GLUCOPHAGE) 1000 MG tablet TAKE 1 TABLET(1000 MG) BY MOUTH TWICE DAILY WITH A MEAL   metoprolol tartrate (LOPRESSOR) 100 MG tablet Take 2 hours prior to Cardiac CT   montelukast (SINGULAIR) 10 MG tablet TAKE 1 TABLET(10 MG) BY MOUTH AT BEDTIME   venlafaxine XR (EFFEXOR-XR) 150 MG 24 hr capsule TAKE 1 CAPSULE BY MOUTH EVERY DAY   No facility-administered encounter medications on file as of 11/30/2021.    Patient Active Problem List   Diagnosis Date Noted   Type 2 diabetes mellitus ([place]) 03/23/2021   Left facial numbness 03/22/2021   Cerebral thrombosis with cerebral infarction 03/22/2021   Cerebral embolism with cerebral infarction 03/22/2021   Macular pucker, right eye 02/13/2021   Crohn disease ([place])    Psoriasis    Type II diabetes mellitus, uncontrolled 09/27/2016   Hx of Crohn's disease 09/27/2016   Asthma 09/27/2016   Hypertension associated with diabetes ([place]) 09/27/2016    Past Medical History:  Diagnosis Date   Asthma    Cold and cough --- no fever   Complication of anesthesia    24 yrs ago post tubal-- pulm. edema  (no problems post bowel surg. 2009)   Depression    Diabetes mellitus    oral med   Diabetes mellitus without complication ([place])  Phreesia 11/28/2020   Diverticulitis hx  -- w/ perf. bowel   s/p resection 2009   Hypertension    Psoriasis    Reflux occasional   watches diet    Relevant past medical, surgical, family and social history reviewed and updated as indicated. Interim medical history since our last visit reviewed.  Review of Systems Per HPI unless specifically indicated above     Objective:    There were no vitals taken for this visit.  Wt Readings from Last 3 Encounters:  11/30/21 240 lb (108.9 kg)  10/09/21 241 lb (109.3 kg)  09/01/21 250 lb (113.4 kg)     Physical Exam Vitals and nursing note reviewed.  Constitutional:      General: She is not in acute distress.    Appearance: Normal appearance. She is not toxic-appearing.  HENT:     Head: Normocephalic and atraumatic.  Pulmonary:     Effort: Pulmonary effort is normal. No respiratory distress.  Skin:    Coloration: Skin is not jaundiced or pale.     Findings: No erythema.  Neurological:     Mental Status: She is alert and oriented to person, place, and time.  Psychiatric:        Mood and Affect: Mood normal.        Behavior: Behavior normal.        Thought Content: Thought content normal.        Judgment: Judgment normal.      Assessment & Plan:  1. Dysuria Acute.  UA today shows 3+ blood, 2+ protein, 3+ leukocyte esterase, 20-40 WBC, 10-20 RBC, and few bacteria.  Send urine for culture and in meantime treat with cephalexin 500 mg every 4 hours for 5 days.  With any back pain or nausea/vomiting and unable to keep fluids down, go to ER.  - Urinalysis, Routine w reflex microscopic - Urine Culture - cephALEXin (KEFLEX) 500 MG capsule; Take 1 capsule (500 mg total) by mouth 4 (four) times daily for 5 days.  Dispense: 20 capsule; Refill: 0     Follow up plan: Return if symptoms worsen or fail to improve.   Due to the catastrophic nature of the COVID-19 pandemic, this video visit was completed soley via audio and visual contact via Caregility due to the restrictions of the COVID-19 pandemic.  All issues as above were discussed and addressed. Physical exam was done as above through visual confirmation on Caregility. If it was felt that the patient should be evaluated in the office, they were directed there. The patient verbally consented to this visit. Location of the patient: home Location of the provider: work Those involved with this call:  Provider: Noemi Chapel, DNP, FNP-C CMA: n/a Front Desk/Registration: Vevelyn Pat  Time spent on call:  4 minutes with  patient face to face via video conference. More than 50% of this time was spent in counseling and coordination of care. 12 minutes total spent in review of patient's record and preparation of their chart. I verified patient identity using two factors (patient name and date of birth). Patient consents verbally to being seen via telemedicine visit today.

## 2021-12-01 LAB — URINE CULTURE
MICRO NUMBER:: 12837632
SPECIMEN QUALITY:: ADEQUATE

## 2021-12-03 ENCOUNTER — Other Ambulatory Visit: Payer: Self-pay

## 2021-12-03 ENCOUNTER — Other Ambulatory Visit: Payer: Medicare Other

## 2021-12-03 DIAGNOSIS — E1169 Type 2 diabetes mellitus with other specified complication: Secondary | ICD-10-CM

## 2021-12-03 DIAGNOSIS — E875 Hyperkalemia: Secondary | ICD-10-CM | POA: Insufficient documentation

## 2021-12-04 LAB — HEMOGLOBIN A1C
Hgb A1c MFr Bld: 6 % of total Hgb — ABNORMAL HIGH (ref <5.7)
Mean Plasma Glucose: 126 mg/dL
eAG (mmol/L): 7 mmol/L

## 2021-12-05 ENCOUNTER — Encounter: Payer: Self-pay | Admitting: Nurse Practitioner

## 2021-12-06 ENCOUNTER — Ambulatory Visit (HOSPITAL_COMMUNITY): Payer: Medicare Other | Attending: Internal Medicine

## 2021-12-06 ENCOUNTER — Other Ambulatory Visit: Payer: Self-pay

## 2021-12-06 DIAGNOSIS — R0609 Other forms of dyspnea: Secondary | ICD-10-CM | POA: Diagnosis not present

## 2021-12-06 DIAGNOSIS — E1159 Type 2 diabetes mellitus with other circulatory complications: Secondary | ICD-10-CM

## 2021-12-06 DIAGNOSIS — I700 Atherosclerosis of aorta: Secondary | ICD-10-CM

## 2021-12-06 DIAGNOSIS — I152 Hypertension secondary to endocrine disorders: Secondary | ICD-10-CM

## 2021-12-06 LAB — ECHOCARDIOGRAM COMPLETE
Area-P 1/2: 3.6 cm2
S' Lateral: 2.2 cm

## 2021-12-10 ENCOUNTER — Telehealth (HOSPITAL_COMMUNITY): Payer: Self-pay | Admitting: *Deleted

## 2021-12-10 NOTE — Telephone Encounter (Signed)
Reaching out to patient to offer assistance regarding upcoming cardiac imaging study; pt verbalizes understanding of appt date/time, parking situation and where to check in, pre-test NPO status and medications ordered, and verified current allergies; name and call back number provided for further questions should they arise  Caitlyn Clement RN Navigator Cardiac [place] and Vascular [phone] office [phone] cell  Patient to take 166m metoprolol tartrate two hours prior to cardiac CT scan. She is aware to arrive at 11:30am for her noon scan.

## 2021-12-11 ENCOUNTER — Other Ambulatory Visit: Payer: Self-pay

## 2021-12-11 ENCOUNTER — Ambulatory Visit (HOSPITAL_COMMUNITY)
Admission: RE | Admit: 2021-12-11 | Discharge: 2021-12-11 | Disposition: A | Discharge status: Home / Self Care | Payer: Medicare Other | Source: Ambulatory Visit | Attending: Internal Medicine | Admitting: Internal Medicine

## 2021-12-11 DIAGNOSIS — I700 Atherosclerosis of aorta: Secondary | ICD-10-CM | POA: Insufficient documentation

## 2021-12-11 DIAGNOSIS — E1159 Type 2 diabetes mellitus with other circulatory complications: Secondary | ICD-10-CM | POA: Insufficient documentation

## 2021-12-11 DIAGNOSIS — R0609 Other forms of dyspnea: Secondary | ICD-10-CM | POA: Diagnosis not present

## 2021-12-11 DIAGNOSIS — I152 Hypertension secondary to endocrine disorders: Secondary | ICD-10-CM | POA: Insufficient documentation

## 2021-12-11 MED ORDER — NITROGLYCERIN 0.4 MG SL SUBL
SUBLINGUAL_TABLET | SUBLINGUAL | Status: AC
  Filled 2021-12-11: qty 2

## 2021-12-11 MED ORDER — IOHEXOL 350 MG/ML SOLN
100.0000 mL | Freq: Once | INTRAVENOUS | Status: AC | PRN
  Administered 2021-12-11: 100 mL via INTRAVENOUS

## 2021-12-11 MED ORDER — NITROGLYCERIN 0.4 MG SL SUBL
0.8000 mg | SUBLINGUAL_TABLET | Freq: Once | SUBLINGUAL | Status: AC
  Administered 2021-12-11: 0.8 mg via SUBLINGUAL

## 2021-12-13 ENCOUNTER — Other Ambulatory Visit: Payer: Self-pay

## 2021-12-13 MED ORDER — ROSUVASTATIN CALCIUM 20 MG PO TABS: 20.0000 mg | ORAL_TABLET | Freq: Every day | ORAL | 3 refills | Status: DC

## 2021-12-18 DIAGNOSIS — G4733 Obstructive sleep apnea (adult) (pediatric): Secondary | ICD-10-CM | POA: Diagnosis not present

## 2021-12-24 ENCOUNTER — Encounter: Payer: Self-pay | Admitting: Family Medicine

## 2021-12-24 DIAGNOSIS — L400 Psoriasis vulgaris: Secondary | ICD-10-CM | POA: Diagnosis not present

## 2021-12-24 DIAGNOSIS — L723 Sebaceous cyst: Secondary | ICD-10-CM | POA: Diagnosis not present

## 2022-01-02 ENCOUNTER — Telehealth: Payer: Self-pay

## 2022-01-02 NOTE — Telephone Encounter (Addendum)
Pt called to report she has had cough and congestion for several days. She took an at-home COVID test today and it is POSITIVE. Pt advised to take Coricidin HBP, Delsym, Tylenol/Advil, increase fluids. Pt denies shob, fevers, chills or other symptoms. Pt would like to know if you could send in antiviral rx.   Please advise, thanks!

## 2022-01-03 ENCOUNTER — Ambulatory Visit: Payer: Medicare Other | Admitting: Family Medicine

## 2022-01-03 MED ORDER — NIRMATRELVIR/RITONAVIR (PAXLOVID)TABLET: 3.0000 | ORAL_TABLET | Freq: Two times a day (BID) | ORAL | 0 refills | Status: AC

## 2022-01-03 NOTE — Telephone Encounter (Signed)
Pt advised. Nothing further needed at this time.

## 2022-01-14 ENCOUNTER — Encounter: Payer: Self-pay | Admitting: Family Medicine

## 2022-01-18 DIAGNOSIS — G4733 Obstructive sleep apnea (adult) (pediatric): Secondary | ICD-10-CM | POA: Diagnosis not present

## 2022-02-22 ENCOUNTER — Other Ambulatory Visit: Payer: Self-pay | Admitting: Family Medicine

## 2022-03-03 NOTE — Progress Notes (Signed)
?Cardiology Office Note:   ? ?Date:  03/04/2022  ? ?ID:  Caitlyn [name], DOB June 19, 1956, MRN [id number] ? ?PCP:  Caitlyn Frizzle, MD ?  ?[facility] Providers ?Cardiologist:  Caitlyn Lean, MD    ? ?Referring MD: Caitlyn Frizzle, MD  ? ?CC: DOE f/u ? ?History of Present Illness:   ? ?Caitlyn [name] is a 66 y.o. female with a hx of HTN with DM, Crohn's Disease, Aortic atherosclerosis, Asthma who presents with DOE 11/2021.  Since last visit normal echo, mild non obstructive CAD on CT.   ? ?Patient notes that she is doing well .   ?Just got back from [facility], son and grandchildren. ?There are no interval hospital/ED visit.   ? ?No chest pain or pressure .  No SOB/DOE (has greatly resolved) and no PND/Orthopnea.  No weight gain or leg swelling.  No palpitations or syncope.  DOE has improved being more active and cleaning house.   Feels less depressed and lost some weight.  Likes to read books, do crafts and lounge in the pool.  Diet is vegetable rich ? ?Brother died unexpectedly from a stroke from vertebral arteries since last visit. ? ? ?Past Medical History:  ?Diagnosis Date  ? Asthma   ? Cold and cough --- no fever  ? Complication of anesthesia   ? 24 yrs ago post tubal-- pulm. edema  (no problems post bowel surg. 2009)  ? Depression   ? Diabetes mellitus   ? oral med  ? Diabetes mellitus without complication ([place])   ? Phreesia 11/28/2020  ? Diverticulitis hx  -- w/ perf. bowel  ? s/p resection 2009  ? Hypertension   ? Psoriasis   ? Reflux occasional  ? watches diet  ? ? ?Past Surgical History:  ?Procedure Laterality Date  ? BOWEL RESECTION  2009  ? diverticulitis w/ perf. bowel  ? COLON SURGERY N/A   ? Phreesia 11/28/2020  ? HYSTEROSCOPY  10/09/2011  ? Procedure: HYSTEROSCOPY;  Surgeon: Margarette Asal;  Location: [facility];  Service: Gynecology;;  HYSTEROSCOPY D & C  ? TUBAL LIGATION  24 yrs ago  ? ? ?Current Medications: ?Current Meds  ?Medication Sig  ? albuterol (VENTOLIN HFA) 108  (90 Base) MCG/ACT inhaler Inhale 2 puffs into the lungs every 4 (four) hours as needed for wheezing or shortness of breath.  ? amLODipine (NORVASC) 10 MG tablet TAKE 1 TABLET(10 MG) BY MOUTH DAILY  ? aspirin EC 81 MG tablet Take 1 tablet (81 mg total) by mouth daily. Swallow whole.  ? COSENTYX SENSOREADY 300 DOSE 150 MG/ML SOAJ Inject 150 mg into the muscle every 30 (thirty) days.  ? fexofenadine (ALLEGRA) 180 MG tablet Take 180 mg by mouth at bedtime.  ? Glucose Blood (BLOOD GLUCOSE TEST STRIPS) STRP Use as directed to monitor FSBS 4x daily. Dx: E11.65.  ? metFORMIN (GLUCOPHAGE) 1000 MG tablet TAKE 1 TABLET(1000 MG) BY MOUTH TWICE DAILY WITH A MEAL  ? metoprolol tartrate (LOPRESSOR) 100 MG tablet Take 2 hours prior to Cardiac CT  ? mometasone (ELOCON) 0.1 % lotion Apply topically 2 (two) times daily.  ? montelukast (SINGULAIR) 10 MG tablet TAKE 1 TABLET(10 MG) BY MOUTH AT BEDTIME  ? rosuvastatin (CRESTOR) 20 MG tablet Take 1 tablet (20 mg total) by mouth daily.  ? venlafaxine XR (EFFEXOR-XR) 150 MG 24 hr capsule TAKE 1 CAPSULE BY MOUTH EVERY DAY  ? [DISCONTINUED] hydrochlorothiazide (HYDRODIURIL) 25 MG tablet Take 1 tablet (25 mg  total) by mouth daily.  ?  ? ?Allergies:   Erythromycin, Hydrochlorothiazide, Pollen extract, and Macrolides and ketolides  ? ?Social History  ? ?Socioeconomic History  ? Marital status: Married  ?  Spouse name: Not on file  ? Number of children: Not on file  ? Years of education: Not on file  ? Highest education level: Not on file  ?Occupational History  ? Occupation: accountant  ?Tobacco Use  ? Smoking status: Never  ? Smokeless tobacco: Never  ?Vaping Use  ? Vaping Use: Never used  ?Substance and Sexual Activity  ? Alcohol use: No  ? Drug use: No  ? Sexual activity: Not on file  ?Other Topics Concern  ? Not on file  ?Social History Narrative  ? Lives with her husband  ? 1-2 cups daily of Caffeine   ? ?Social Determinants of Health  ? ?Financial Resource Strain: Not on file  ?Food  Insecurity: Not on file  ?Transportation Needs: Not on file  ?Physical Activity: Not on file  ?Stress: Not on file  ?Social Connections: Not on file  ?  ?Social:  Retired from [employer] in 2022, has son and grandchildren ? ?Family History: ?The patient's family history includes Diabetes in her brother; Stroke in her father. ?History of coronary artery disease notable for no members. ?History of heart failure notable for no members. ?History of arrhythmia notable for no members. ?Brother died of vertebral artery stroke. ? ?ROS:   ?Please see the history of present illness.    ? All other systems reviewed and are negative. ? ?EKGs/Labs/Other Studies Reviewed:   ? ?The following studies were reviewed today: ? ?EKG:   ?11/30/21: SR rate 90 LVH ? ?Transthoracic Echocardiogram: ?Date: 09/01/2015 ?Results: Report only ?Study Conclusions  ? ?- Left ventricle: The cavity size was normal. Wall thickness was  ?  normal. Systolic function was normal. The estimated ejection  ?  fraction was in the range of 50% to 55%. Wall motion was normal;  ?  there were no regional wall motion abnormalities. Doppler  ?  parameters are consistent with abnormal left ventricular  ?  relaxation (grade 1 diastolic dysfunction). ? ?CTPE: ?Date: 08/23/2015 ?Results: ?Aortic Atherosclerosis ?No PE ? ? ?Recent Labs: ?10/09/2021: ALT 19; Brain Natriuretic Peptide 25; Hemoglobin 13.5; Platelets 374 ?11/30/2021: BUN 26; Creatinine, Ser 1.17; Potassium 5.7; Sodium 138  ?Recent Lipid Panel ?   ?Component Value Date/Time  ? CHOL 169 08/21/2021 1230  ? TRIG 123 08/21/2021 1230  ? HDL 51 08/21/2021 1230  ? CHOLHDL 3.3 08/21/2021 1230  ? VLDL 18 03/22/2021 1943  ? [place] 96 08/21/2021 1230  ? ? ?Physical Exam:   ? ?VS:  BP 120/70   Pulse 90   Ht 5' 7"  (1.702 m)   Wt 239 lb 6.4 oz (108.6 kg)   SpO2 97%   BMI 37.50 kg/m?    ? ? ?Wt Readings from Last 3 Encounters:  ?03/04/22 239 lb 6.4 oz (108.6 kg)  ?11/30/21 240 lb (108.9 kg)  ?10/09/21 241 lb (109.3 kg)  ?   ?Gen: No distress, Morbid obesity  ?Neck: No JVD,  ?Cardiac: No Rubs or Gallops, no murmur, regular +2 radial pulses ?Respiratory: Clear to auscultation bilaterally, normal effort, normal respiratory rate ?GI: Soft, nontender, non-distended  ?MS:  trace bilateral edema;  moves all extremities ?Integument: Skin feels warm ?Neuro:  At time of evaluation, alert and oriented to person/place/time/situation  ?Psych: Normal affect, patient feels OK  ? ? ?ASSESSMENT:   ? ?1.  Coronary artery disease involving native coronary artery of native heart without angina pectoris   ?2. Aortic atherosclerosis ([place])   ?3. Hypertension associated with diabetes ([place])   ? ? ?PLAN:   ? ?Mild non obstructive CAD ?HTN with DM ?DOE- has resolved ?Asthma ?Aortic Atherosclerosis ?Prior TIA (possible X2) 02/2021 ?- Aspirin 81 mg PO continued for CAC ?- reviewed Cardiac CT with patient and husband ?- continue amlodipine 10 mg and HCTZ 25 mg PO daily; if leg swelling worsens, we can do a trial off norvasc at next visit ?- lipids and ALT today, LDL < 55  ?- may increase rosuvastatin to 40 mg ?- we did dietary education on sugar excess ?- we did exercise intervention using walking, books on tape, and swimming ? ? ?One year me or APP ? ? ?   ? ?Medication Adjustments/Labs and Tests Ordered: ?Current medicines are reviewed at length with the patient today.  Concerns regarding medicines are outlined above.  ?Orders Placed This Encounter  ?Procedures  ? ALT  ? Lipid panel  ? ?No orders of the defined types were placed in this encounter. ? ? ?Patient Instructions  ?Medication Instructions:  ?Your physician recommends that you continue on your current medications as directed. Please refer to the Current Medication list given to you today. ? ?*If you need a refill on your cardiac medications before your next appointment, please call your pharmacy* ? ? ?Lab Work: ?TODAY: Lipid panel, ALT ?If you have labs (blood work) drawn today and your tests are  completely normal, you will receive your results only by: ?MyChart Message (if you have MyChart) OR ?A paper copy in the mail ?If you have any lab test that is abnormal or we need to change your treatment, we will call y

## 2022-03-04 ENCOUNTER — Ambulatory Visit (INDEPENDENT_AMBULATORY_CARE_PROVIDER_SITE_OTHER): Payer: Medicare Other | Admitting: Internal Medicine

## 2022-03-04 ENCOUNTER — Encounter: Payer: Self-pay | Admitting: Internal Medicine

## 2022-03-04 VITALS — BP 120/70 | HR 90 | Ht 67.0 in | Wt 239.4 lb

## 2022-03-04 DIAGNOSIS — I700 Atherosclerosis of aorta: Secondary | ICD-10-CM

## 2022-03-04 DIAGNOSIS — I152 Hypertension secondary to endocrine disorders: Secondary | ICD-10-CM

## 2022-03-04 DIAGNOSIS — E1159 Type 2 diabetes mellitus with other circulatory complications: Secondary | ICD-10-CM

## 2022-03-04 DIAGNOSIS — I2510 Atherosclerotic heart disease of native coronary artery without angina pectoris: Secondary | ICD-10-CM | POA: Diagnosis not present

## 2022-03-04 LAB — LIPID PANEL
Chol/HDL Ratio: 1.9 ratio (ref 0.0–4.4)
Cholesterol, Total: 108 mg/dL (ref 100–199)
HDL: 58 mg/dL (ref >39)
LDL Chol Calc (NIH): 36 mg/dL (ref 0–99)
Triglycerides: 66 mg/dL (ref 0–149)
VLDL Cholesterol Cal: 14 mg/dL (ref 5–40)

## 2022-03-04 LAB — ALT: ALT: 19 IU/L (ref 0–32)

## 2022-03-04 NOTE — Patient Instructions (Signed)
Medication Instructions:  ?Your physician recommends that you continue on your current medications as directed. Please refer to the Current Medication list given to you today. ? ?*If you need a refill on your cardiac medications before your next appointment, please call your pharmacy* ? ? ?Lab Work: ?TODAY: Lipid panel, ALT ?If you have labs (blood work) drawn today and your tests are completely normal, you will receive your results only by: ?MyChart Message (if you have MyChart) OR ?A paper copy in the mail ?If you have any lab test that is abnormal or we need to change your treatment, we will call you to review the results. ? ? ?Testing/Procedures: ?NONE ? ? ?Follow-Up: ?At [facility], you and your health needs are our priority.  As part of our continuing mission to provide you with exceptional heart care, we have created designated Provider Care Teams.  These Care Teams include your primary Cardiologist (physician) and Advanced Practice Providers (APPs -  Physician Assistants and Nurse Practitioners) who all work together to provide you with the care you need, when you need it. ? ?We recommend signing up for the patient portal called "MyChart".  Sign up information is provided on this After Visit Summary.  MyChart is used to connect with patients for Virtual Visits (Telemedicine).  Patients are able to view lab/test results, encounter notes, upcoming appointments, etc.  Non-urgent messages can be sent to your provider as well.   ?To learn more about what you can do with MyChart, go to [email or web address].   ? ?Your next appointment:   ?1 year(s) ? ?The format for your next appointment:   ?In Person ? ?Provider:   ?Werner Lean, MD   ? ?Important Information About Sugar ? ? ? ? ?  ?

## 2022-03-08 ENCOUNTER — Other Ambulatory Visit: Payer: Self-pay | Admitting: Family Medicine

## 2022-03-08 ENCOUNTER — Encounter: Payer: Self-pay | Admitting: Family Medicine

## 2022-03-08 MED ORDER — AMOXICILLIN 875 MG PO TABS: 875.0000 mg | ORAL_TABLET | Freq: Two times a day (BID) | ORAL | 0 refills | Status: AC

## 2022-03-11 NOTE — Telephone Encounter (Signed)
Dr. Dennard Schaumann sent in Rx  ?

## 2022-03-22 ENCOUNTER — Other Ambulatory Visit: Payer: Self-pay | Admitting: Family Medicine

## 2022-03-22 ENCOUNTER — Encounter: Payer: Self-pay | Admitting: Family Medicine

## 2022-03-22 MED ORDER — SULFAMETHOXAZOLE-TRIMETHOPRIM 800-160 MG PO TABS: 1.0000 | ORAL_TABLET | Freq: Two times a day (BID) | ORAL | 0 refills | Status: DC

## 2022-04-15 ENCOUNTER — Encounter: Payer: Self-pay | Admitting: Family Medicine

## 2022-04-15 ENCOUNTER — Other Ambulatory Visit: Payer: Self-pay | Admitting: Family Medicine

## 2022-04-15 MED ORDER — CIPROFLOXACIN HCL 500 MG PO TABS: 500.0000 mg | ORAL_TABLET | Freq: Two times a day (BID) | ORAL | 0 refills | Status: AC

## 2022-05-23 ENCOUNTER — Other Ambulatory Visit: Payer: Self-pay | Admitting: Family Medicine

## 2022-05-23 NOTE — Telephone Encounter (Signed)
Requested Prescriptions  Pending Prescriptions Disp Refills  . hydrochlorothiazide (HYDRODIURIL) 25 MG tablet [Pharmacy Med Name: HYDROCHLOROTHIAZIDE 25MG TABLETS] 90 tablet 3    Sig: TAKE 1 TABLET(25 MG) BY MOUTH DAILY     Cardiovascular: Diuretics - Thiazide Failed - 05/23/2022  7:25 AM      Failed - Cr in normal range and within 180 days    Creat  Date Value Ref Range Status  10/09/2021 1.23 (H) 0.50 - 1.05 mg/dL Final   Creatinine, Ser  Date Value Ref Range Status  11/30/2021 1.17 (H) 0.57 - 1.00 mg/dL Final   Creatinine, Urine  Date Value Ref Range Status  12/01/2020 91 20 - 275 mg/dL Final         Failed - K in normal range and within 180 days    Potassium  Date Value Ref Range Status  11/30/2021 5.7 (H) 3.5 - 5.2 mmol/L Final         Passed - Na in normal range and within 180 days    Sodium  Date Value Ref Range Status  11/30/2021 138 134 - 144 mmol/L Final         Passed - Last BP in normal range    BP Readings from Last 1 Encounters:  03/04/22 120/70         Passed - Valid encounter within last 6 months    Recent Outpatient Visits          5 months ago [place] Eulogio Bear, NP   6 months ago Upper respiratory tract infection, unspecified type   [place] Eulogio Bear, NP   7 months ago Type 2 diabetes mellitus with other specified complication, unspecified whether long term insulin use ([place])   [place] Pickard, Cammie Mcgee, MD   9 months ago Type 2 diabetes mellitus with other specified complication, unspecified whether long term insulin use ([place])   [place] Pickard, Cammie Mcgee, MD   11 months ago Ganglion cyst of dorsum of left wrist   [place] Denn[place] Schaumann, Cammie Mcgee, MD             . venlafaxine XR (EFFEXOR-XR) 150 MG 24 hr capsule [Pharmacy Med Name: VENLAFAXINE ER 150MG CAPSULES] 90 capsule 0    Sig: TAKE 1 [place]      Psychiatry: Antidepressants - SNRI - desvenlafaxine & venlafaxine Failed - 05/23/2022  7:25 AM      Failed - Cr in normal range and within 360 days    Creat  Date Value Ref Range Status  10/09/2021 1.23 (H) 0.50 - 1.05 mg/dL Final   Creatinine, Ser  Date Value Ref Range Status  11/30/2021 1.17 (H) 0.57 - 1.00 mg/dL Final   Creatinine, Urine  Date Value Ref Range Status  12/01/2020 91 20 - 275 mg/dL Final         Failed - Lipid Panel in normal range within the last 12 months    Cholesterol, Total  Date Value Ref Range Status  03/04/2022 108 100 - 199 mg/dL Final   LDL Cholesterol (Calc)  Date Value Ref Range Status  08/21/2021 96 mg/dL (calc) Final    Comment:    Reference range: <100 . Desirable range <100 mg/dL for primary prevention;   <70 mg/dL for patients with CHD or diabetic patients  with > or = 2 CHD risk factors. Marland Kitchen LDL-C is now calculated using the  Martin-Hopkins  calculation, which is a validated novel method providing  better accuracy than the Friedewald equation in the  estimation of LDL-C.  Cresenciano Genre et al. Annamaria Helling. 6734;193(79): 2061-2068  (http://education.QuestDiagnostics.com/faq/FAQ164)    LDL Chol Calc (NIH)  Date Value Ref Range Status  03/04/2022 36 0 - 99 mg/dL Final   HDL  Date Value Ref Range Status  03/04/2022 58 >39 mg/dL Final   Triglycerides  Date Value Ref Range Status  03/04/2022 66 0 - 149 mg/dL Final         Passed - Last BP in normal range    BP Readings from Last 1 Encounters:  03/04/22 120/70         Passed - Valid encounter within last 6 months    Recent Outpatient Visits          5 months ago [place] Eulogio Bear, NP   6 months ago Upper respiratory tract infection, unspecified type   [place] Noemi Chapel A, NP   7 months ago Type 2 diabetes mellitus with other specified complication, unspecified whether long term insulin use ([place])   [place] Susy Frizzle, MD   9 months ago Type 2 diabetes mellitus with other specified complication, unspecified whether long term insulin use ([place])   [place] Pickard, Cammie Mcgee, MD   11 months ago Ganglion cyst of dorsum of left wrist   [place] Pickard, Cammie Mcgee, MD             . montelukast (SINGULAIR) 10 MG tablet [Pharmacy Med Name: MONTELUKAST 10MG TABLETS] 90 tablet 0    Sig: TAKE 1 TABLET(10 MG) BY MOUTH AT BEDTIME     Pulmonology:  Leukotriene Inhibitors Passed - 05/23/2022  7:25 AM      Passed - Valid encounter within last 12 months    Recent Outpatient Visits          5 months ago [place] Eulogio Bear, NP   6 months ago Upper respiratory tract infection, unspecified type   [place] Noemi Chapel A, NP   7 months ago Type 2 diabetes mellitus with other specified complication, unspecified whether long term insulin use ([place])   [place] Pickard, Cammie Mcgee, MD   9 months ago Type 2 diabetes mellitus with other specified complication, unspecified whether long term insulin use ([place])   [place] Pickard, Cammie Mcgee, MD   11 months ago Ganglion cyst of dorsum of left wrist   [place] Pickard, Cammie Mcgee, MD             . metFORMIN (GLUCOPHAGE) 1000 MG tablet [Pharmacy Med Name: METFORMIN 1000MG TABLETS] 180 tablet 0    Sig: TAKE 1 TABLET(1000 MG) BY MOUTH TWICE DAILY WITH A MEAL     Endocrinology:  Diabetes - Biguanides Failed - 05/23/2022  7:25 AM      Failed - Cr in normal range and within 360 days    Creat  Date Value Ref Range Status  10/09/2021 1.23 (H) 0.50 - 1.05 mg/dL Final   Creatinine, Ser  Date Value Ref Range Status  11/30/2021 1.17 (H) 0.57 - 1.00 mg/dL Final   Creatinine, Urine  Date Value Ref Range Status  12/01/2020 91 20 - 275 mg/dL Final         Failed - eGFR in  normal range and  within 360 days    GFR, Est African American  Date Value Ref Range Status  01/01/2019 76 > OR = 60 mL/min/1.50m Final   GFR, Est Non African American  Date Value Ref Range Status  01/01/2019 66 > OR = 60 mL/min/1.734mFinal   GFR, Estimated  Date Value Ref Range Status  09/01/2021 48 (L) >60 mL/min Final    Comment:    (NOTE) Calculated using the CKD-EPI Creatinine Equation (2021)    eGFR  Date Value Ref Range Status  11/30/2021 52 (L) >59 mL/min/1.73 Final         Failed - B12 Level in normal range and within 720 days    No results found for: "VITAMINB12"       Passed - HBA1C is between 0 and 7.9 and within 180 days    Hgb A1c MFr Bld  Date Value Ref Range Status  12/03/2021 6.0 (H) <5.7 % of total Hgb Final    Comment:    For someone without known diabetes, a hemoglobin  A1c value between 5.7% and 6.4% is consistent with prediabetes and should be confirmed with a  follow-up test. . For someone with known diabetes, a value <7% indicates that their diabetes is well controlled. A1c targets should be individualized based on duration of diabetes, age, comorbid conditions, and other considerations. . This assay result is consistent with an increased risk of diabetes. . Currently, no consensus exists regarding use of hemoglobin A1c for diagnosis of diabetes for children. . Renella Cunas Valid encounter within last 6 months    Recent Outpatient Visits          5 months ago [place]   6 months ago Upper respiratory tract infection, unspecified type   [place], [place]   7 months ago Type 2 diabetes mellitus with other specified complication, unspecified whether long term insulin use ([place]  [place] Fri[place]   9 months ago Type 2 diabetes mellitus with other specified complication, unspecified whether long term insulin use ([place]  [place] Mcgee[place]   11 months ago Ganglion cyst of dorsum of left wrist   [place], [place] McgeeMD             Passed - CBC within normal limits and completed in the last 12 months    WBC  Date Value Ref Range Status  10/09/2021 7.2 3.8 - 10.8 Thousand/uL Final   RBC  Date Value Ref Range Status  10/09/2021 4.47 3.80 - 5.10 Million/uL Final   Hemoglobin  Date Value Ref Range Status  10/09/2021 13.5 11.7 - 15.5 g/dL Final   HCT  Date Value Ref Range Status  10/09/2021 39.4 35.0 - 45.0 % Final   MCHC  Date Value Ref Range Status  10/09/2021 34.3 32.0 - 36.0 g/dL Final   [facility]  10/09/2021 30.2 27.0 - 33.0 pg Final   MCV  Date Value Ref Range Status  10/09/2021 88.1 80.0 - 100.0 fL Final   No results found for: "PLTCOUNTKUC", "LABPLAT", "POCPLA" RDW  Date Value Ref Range Status  10/09/2021 12.1 11.0 - 15.0 % Final         . amLODipine (NORVASC) 10 MG tablet [Pharmacy Med Name: AMLODIPINE  BESYLATE 10MG TABLETS] 90 tablet 0    Sig: TAKE 1 TABLET(10 MG) BY MOUTH DAILY     Cardiovascular: Calcium Channel Blockers 2 Passed - 05/23/2022  7:25 AM      Passed - Last BP in normal range    BP Readings from Last 1 Encounters:  03/04/22 120/70         Passed - Last Heart Rate in normal range    Pulse Readings from Last 1 Encounters:  03/04/22 90         Passed - Valid encounter within last 6 months    Recent Outpatient Visits          5 months ago [place] Eulogio Bear, NP   6 months ago Upper respiratory tract infection, unspecified type   [place] Noemi Chapel A, NP   7 months ago Type 2 diabetes mellitus with other specified complication, unspecified whether long term insulin use ([place])   [place] Susy Frizzle, MD   9 months ago Type 2 diabetes mellitus with other specified complication, unspecified  whether long term insulin use ([place])   [place] Pickard, Cammie Mcgee, MD   11 months ago Ganglion cyst of dorsum of left wrist   [place] Pickard, Cammie Mcgee, MD

## 2022-05-23 NOTE — Telephone Encounter (Signed)
Requested medication (s) are due for refill today: no  Requested medication (s) are on the active medication list: no  Last refill:  09/03/21  Future visit scheduled: no  Notes to clinic:  Unable to refill per protocol, Rx expired. Medication was discontinued 03/04/22, refill not appropriate.      Requested Prescriptions  Pending Prescriptions Disp Refills   hydrochlorothiazide (HYDRODIURIL) 25 MG tablet [Pharmacy Med Name: HYDROCHLOROTHIAZIDE 25MG TABLETS] 90 tablet 3    Sig: TAKE 1 TABLET(25 MG) BY MOUTH DAILY     Cardiovascular: Diuretics - Thiazide Failed - 05/23/2022  7:25 AM      Failed - Cr in normal range and within 180 days    Creat  Date Value Ref Range Status  10/09/2021 1.23 (H) 0.50 - 1.05 mg/dL Final   Creatinine, Ser  Date Value Ref Range Status  11/30/2021 1.17 (H) 0.57 - 1.00 mg/dL Final   Creatinine, Urine  Date Value Ref Range Status  12/01/2020 91 20 - 275 mg/dL Final         Failed - K in normal range and within 180 days    Potassium  Date Value Ref Range Status  11/30/2021 5.7 (H) 3.5 - 5.2 mmol/L Final         Passed - Na in normal range and within 180 days    Sodium  Date Value Ref Range Status  11/30/2021 138 134 - 144 mmol/L Final         Passed - Last BP in normal range    BP Readings from Last 1 Encounters:  03/04/22 120/70         Passed - Valid encounter within last 6 months    Recent Outpatient Visits           5 months ago [place] Eulogio Bear, NP   6 months ago Upper respiratory tract infection, unspecified type   [place] Eulogio Bear, NP   7 months ago Type 2 diabetes mellitus with other specified complication, unspecified whether long term insulin use ([place])   [place] Pickard, Cammie Mcgee, MD   9 months ago Type 2 diabetes mellitus with other specified complication, unspecified whether long term insulin use ([place])   [place] Pickard, Cammie Mcgee, MD   11 months ago Ganglion cyst of dorsum of left wrist   [place] Pickard, Cammie Mcgee, MD              Signed Prescriptions Disp Refills   venlafaxine XR (EFFEXOR-XR) 150 MG 24 hr capsule 90 capsule 0    Sig: TAKE 1 [place]     Psychiatry: Antidepressants - SNRI - desvenlafaxine & venlafaxine Failed - 05/23/2022  7:25 AM      Failed - Cr in normal range and within 360 days    Creat  Date Value Ref Range Status  10/09/2021 1.23 (H) 0.50 - 1.05 mg/dL Final   Creatinine, Ser  Date Value Ref Range Status  11/30/2021 1.17 (H) 0.57 - 1.00 mg/dL Final   Creatinine, Urine  Date Value Ref Range Status  12/01/2020 91 20 - 275 mg/dL Final         Failed - Lipid Panel in normal range within the last 12 months    Cholesterol, Total  Date Value Ref Range Status  03/04/2022 108 100 - 199 mg/dL Final   LDL Cholesterol (Calc)  Date Value  Ref Range Status  08/21/2021 96 mg/dL (calc) Final    Comment:    Reference range: <100 . Desirable range <100 mg/dL for primary prevention;   <70 mg/dL for patients with CHD or diabetic patients  with > or = 2 CHD risk factors. [place] Kitchen LDL-C is now calculated using the Martin-Hopkins  calculation, which is a validated novel method providing  better accuracy than the Friedewald equation in the  estimation of LDL-C.  Cresenciano Genre et al. Annamaria Helling. 9518;841(66): 2061-2068  (http://education.[facility].com/faq/FAQ164)    LDL Chol Calc (NIH)  Date Value Ref Range Status  03/04/2022 36 0 - 99 mg/dL Final   HDL  Date Value Ref Range Status  03/04/2022 58 >39 mg/dL Final   Triglycerides  Date Value Ref Range Status  03/04/2022 66 0 - 149 mg/dL Final         Passed - Last BP in normal range    BP Readings from Last 1 Encounters:  03/04/22 120/70         Passed - Valid encounter within last 6 months    Recent Outpatient Visits           5 months ago [place] Eulogio Bear, NP   6 months ago Upper respiratory tract infection, unspecified type   [place] Noemi Chapel A, NP   7 months ago Type 2 diabetes mellitus with other specified complication, unspecified whether long term insulin use ([place])   [place] Pickard, Cammie Mcgee, MD   9 months ago Type 2 diabetes mellitus with other specified complication, unspecified whether long term insulin use ([place])   [place] Pickard, Cammie Mcgee, MD   11 months ago Ganglion cyst of dorsum of left wrist   [place] Pickard, Cammie Mcgee, MD               montelukast (SINGULAIR) 10 MG tablet 90 tablet 0    Sig: TAKE 1 TABLET(10 MG) BY MOUTH AT BEDTIME     Pulmonology:  Leukotriene Inhibitors Passed - 05/23/2022  7:25 AM      Passed - Valid encounter within last 12 months    Recent Outpatient Visits           5 months ago [place] Eulogio Bear, NP   6 months ago Upper respiratory tract infection, unspecified type   [place] Noemi Chapel A, NP   7 months ago Type 2 diabetes mellitus with other specified complication, unspecified whether long term insulin use ([place])   [place] Pickard, Cammie Mcgee, MD   9 months ago Type 2 diabetes mellitus with other specified complication, unspecified whether long term insulin use ([place])   [place] Pickard, Cammie Mcgee, MD   11 months ago Ganglion cyst of dorsum of left wrist   [place] Pickard, Cammie Mcgee, MD               metFORMIN (GLUCOPHAGE) 1000 MG tablet 180 tablet 0    Sig: TAKE 1 TABLET(1000 MG) BY MOUTH TWICE DAILY WITH A MEAL     Endocrinology:  Diabetes - Biguanides Failed - 05/23/2022  7:25 AM      Failed - Cr in normal range and within 360 days    Creat  Date Value Ref Range Status  10/09/2021 1.23 (H) 0.50 - 1.05 mg/dL Final   Creatinine,  Ser  Date Value Ref Range Status  11/30/2021 1.17 (H) 0.57 - 1.00 mg/dL Final   Creatinine, Urine  Date Value Ref Range Status  12/01/2020 91 20 - 275 mg/dL Final         Failed - eGFR in normal range and within 360 days    GFR, Est African American  Date Value Ref Range Status  01/01/2019 76 > OR = 60 mL/min/1.64m Final   GFR, Est Non African American  Date Value Ref Range Status  01/01/2019 66 > OR = 60 mL/min/1.741mFinal   GFR, Estimated  Date Value Ref Range Status  09/01/2021 48 (L) >60 mL/min Final    Comment:    (NOTE) Calculated using the CKD-EPI Creatinine Equation (2021)    eGFR  Date Value Ref Range Status  11/30/2021 52 (L) >59 mL/min/1.73 Final         Failed - B12 Level in normal range and within 720 days    No results found for: "VITAMINB12"       Passed - HBA1C is between 0 and 7.9 and within 180 days    Hgb A1c MFr Bld  Date Value Ref Range Status  12/03/2021 6.0 (H) <5.7 % of total Hgb Final    Comment:    For someone without known diabetes, a hemoglobin  A1c value between 5.7% and 6.4% is consistent with prediabetes and should be confirmed with a  follow-up test. . For someone with known diabetes, a value <7% indicates that their diabetes is well controlled. A1c targets should be individualized based on duration of diabetes, age, comorbid conditions, and other considerations. . This assay result is consistent with an increased risk of diabetes. . Currently, no consensus exists regarding use of hemoglobin A1c for diagnosis of diabetes for children. . Renella Cunas Valid encounter within last 6 months    Recent Outpatient Visits           5 months ago [place] Bear[place]   6 months ago Upper respiratory tract infection, unspecified type   [place], NP   7 months ago Type 2 diabetes mellitus with other specified complication, unspecified whether  long term insulin use ([place]  BrHartmaniSusy FrizzleMD   9 months ago Type 2 diabetes mellitus with other specified complication, unspecified whether long term insulin use ([facility] [facility] [place]  [place]ickard, Wa[place]ammie McgeeMD   11 months ago Ganglion cyst of dorsum of left wrist   [place]ickard, WaCammie McgeeMD              Passed - CBC within normal limits and completed in the last 12 months    WBC  Date Value Ref Range Status  10/09/2021 7.2 3.8 - 10.8 Thousand/uL Final   RBC  Date Value Ref Range Status  10/09/2021 4.47 3.80 - 5.10 Million/uL Final   Hemoglobin  Date Value Ref Range Status  10/09/2021 13.5 11.7 - 15.5 g/dL Final   HCT  Date Value Ref Range Status  10/09/2021 39.4 35.0 - 45.0 % Final   MCHC  Date Value Ref Range Status  10/09/2021 34.3 32.0 - 36.0 g/dL Final   [facility] Ref Range Status  10/09/2021 30.2 27.0 - 33.0 pg Final   MCV  Date Value Ref Range Status  10/09/2021 88.1  80.0 - 100.0 fL Final   No results found for: "PLTCOUNTKUC", "LABPLAT", "POCPLA" RDW  Date Value Ref Range Status  10/09/2021 12.1 11.0 - 15.0 % Final          amLODipine (NORVASC) 10 MG tablet 90 tablet 0    Sig: TAKE 1 TABLET(10 MG) BY MOUTH DAILY     Cardiovascular: Calcium Channel Blockers 2 Passed - 05/23/2022  7:25 AM      Passed - Last BP in normal range    BP Readings from Last 1 Encounters:  03/04/22 120/70         Passed - Last Heart Rate in normal range    Pulse Readings from Last 1 Encounters:  03/04/22 90         Passed - Valid encounter within last 6 months    Recent Outpatient Visits           5 months ago [place] Eulogio Bear, NP   6 months ago Upper respiratory tract infection, unspecified type   [place] Noemi Chapel A, NP   7 months ago Type 2 diabetes mellitus with other specified complication, unspecified whether long  term insulin use ([place])   [place] Susy Frizzle, MD   9 months ago Type 2 diabetes mellitus with other specified complication, unspecified whether long term insulin use ([place])   [place] Pickard, Cammie Mcgee, MD   11 months ago Ganglion cyst of dorsum of left wrist   [place] Pickard, Cammie Mcgee, MD

## 2022-06-30 IMAGING — CT CT HEART MORP W/ CTA COR W/ SCORE W/ CA W/CM &/OR W/O CM
4 of 7 series · 8 of 20 positions shown, 9 images · IV contrast (omnipaque)
Comparison: None.
COMPARISON: None.

Addendum:
EXAM:
OVER-READ INTERPRETATION  CT CHEST

The following report is an over-read performed by radiologist Dr.
Nekeisha Rawcash [REDACTED] on 12/11/2021. This
over-read does not include interpretation of cardiac or coronary
anatomy or pathology. The coronary calcium score/coronary CTA
interpretation by the cardiologist is attached.
HISTORY: Chest pain/anginal equiv, high CAD risk, not treadmill candidate
Dyspnea on exertion ([place])
Cardiac/Coronary CT
TECHNIQUE: The patient was scanned on a Siemens Force scanner.
PROTOCOL: A 120 kV prospective scan was triggered in the descending thoracic
aorta at 111 HU's. Axial non-contrast 3 mm slices were carried out
through the heart. The data set was analyzed on a dedicated work
station and scored using the Agatson method. Gantry rotation speed
was 250 msecs and collimation was 0.6 mm. Heart rate optimized
medically, and 0.8 mg of sublingual nitroglycerin was given. The 3D
data set was reconstructed in 5% intervals of 35-75% of the R-R
cycle. Diastolic phases were analyzed on a dedicated work station
using MPR, MIP and VRT modes. The patient received 100mL OMNIPAQUE
IOHEXOL 350 MG/ML SOLN of contrast.

[Series 6: best syst · axial · 0.39mm/px · z∈[-32,+9]mm · 2 of 311 slices shown, 3 images]
[im 104/311  vessel]
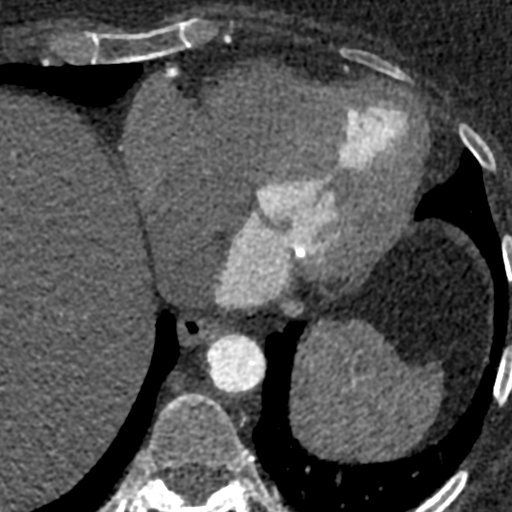
[im 104/311  lung]
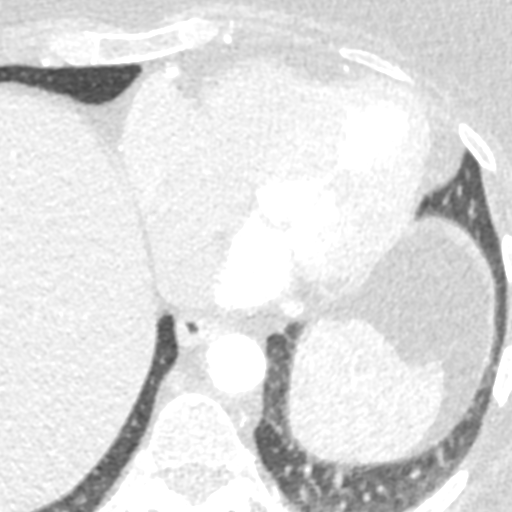
[im 207/311  vessel]
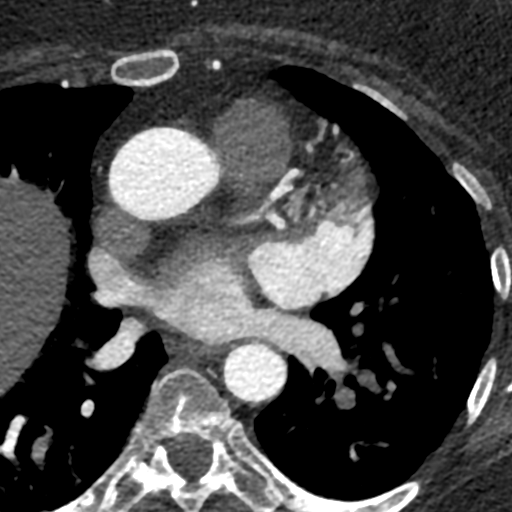

[Series 7: ts syst sharp · axial · 0.39mm/px · z∈[-32,+9]mm · 2 of 311 slices shown]
[im 104/311  lung]
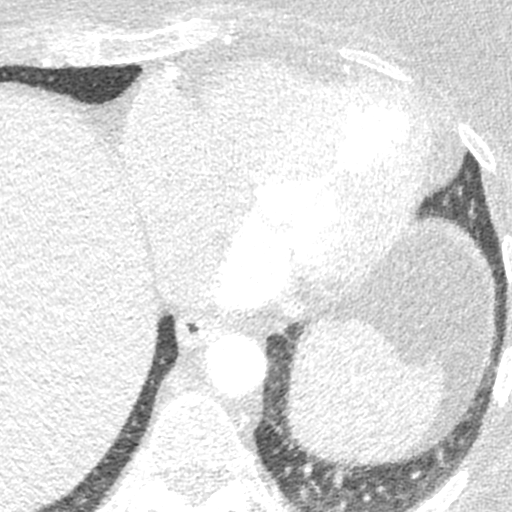
[im 207/311  lung]
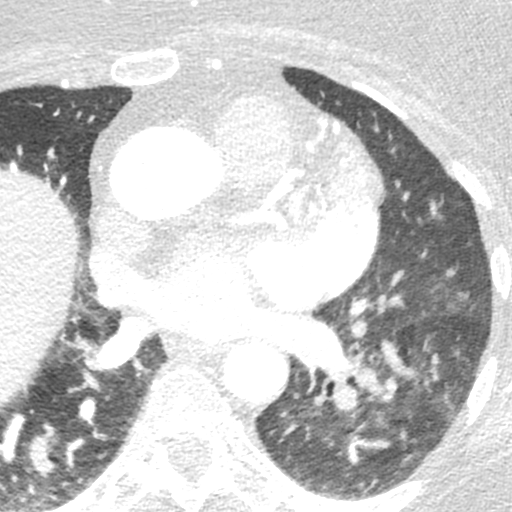

[Series 8: ts diast sharp · axial · 0.39mm/px · z∈[-32,+9]mm · 2 of 311 slices shown]
[im 104/311  lung]
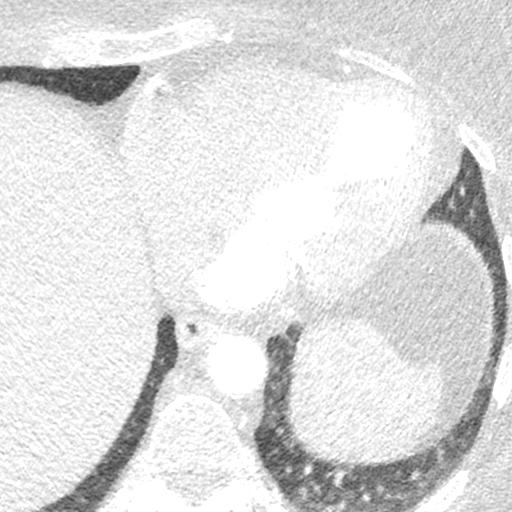
[im 207/311  lung]
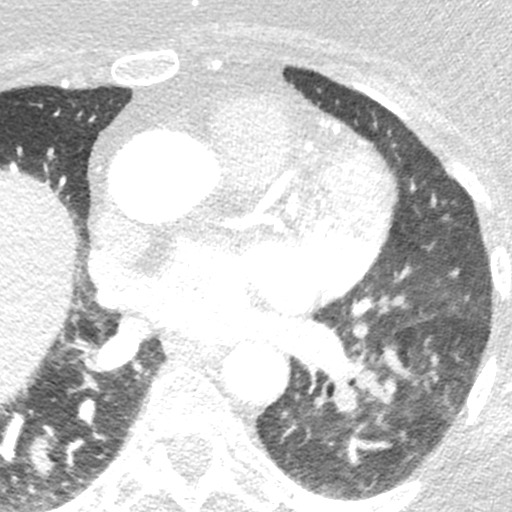

[Series 9: best diast · axial · 0.39mm/px · z∈[-32,+9]mm · 2 of 311 slices shown]
[im 104/311  vessel]
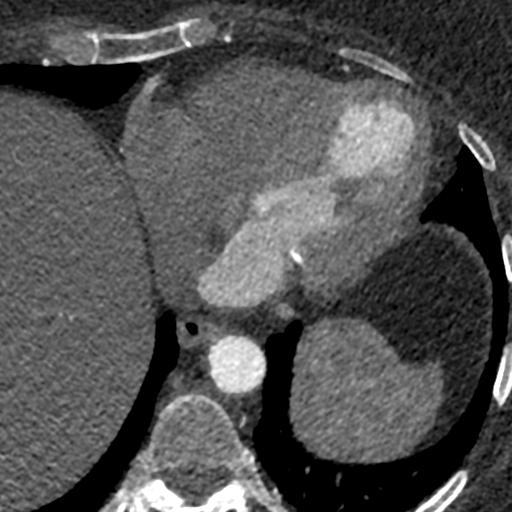
[im 207/311  vessel]
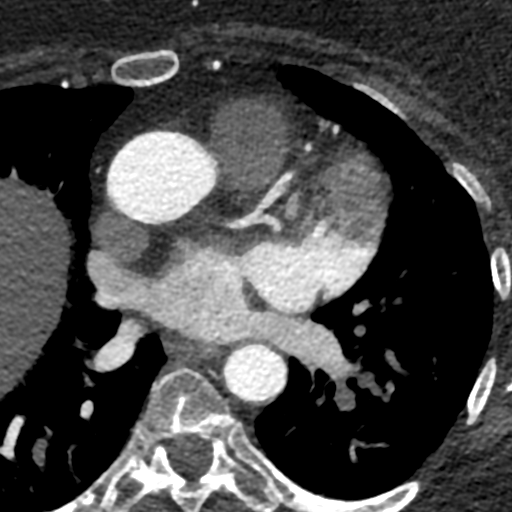

[8 of 20 positions shown; findings below may reference images not displayed]

FINDINGS: Limited view of the lung parenchyma demonstrates no suspicious
nodularity. Airways are normal.

Limited view of the mediastinum demonstrates no adenopathy.
Esophagus normal.

Limited view of the upper abdomen unremarkable.

Limited view of the skeleton and chest wall is unremarkable.
IMPRESSION: No significant extracardiac findings
FINDINGS: Coronary calcium score: The patient's coronary artery calcium score
is 64, which places the patient in the 78th percentile.

Coronary arteries: Normal coronary origins.  Right dominance.

Right Coronary Artery: Normal caliber vessel, gives rise to PDA.
Mixed calcified and noncalcified plaque in proximal RCA with 1-24%
stenosis. Mixed calcified and noncalcified plaque in mid RCA with
1-24% stenosis.

Left Main Coronary Artery: Normal caliber vessel. No significant
plaque or stenosis.

Left Anterior Descending Coronary Artery: Normal caliber vessel. No
significant plaque or stenosis. Gives rise to large first diagonal
branch with mixed calcified and noncalcified plaque in mid D1 with
25-49% stenosis.

Left Circumflex Artery: Small caliber vessel. No significant plaque
or stenosis in main segment. OM branches are small and tortuous.

Aorta: Normal size, 37 mm at the mid ascending aorta (level of the
PA bifurcation) measured double oblique. Scattered calcifications
consistent with aortic atherosclerosis. No dissection seen in
visualized portions of the aorta.

Aortic Valve: No calcifications. Trileaflet.

Other findings:

Normal pulmonary vein drainage into the left atrium.

Normal left atrial appendage without a thrombus.

Enlarged size of the pulmonary artery (42 mm).

Normal appearance of the pericardium.

Mitral annular calcification.
IMPRESSION: 1.  Mild nonobstructive CAD, CADRADS = 2.

2. Coronary calcium score of 64. This was 78th percentile for age
and sex matched control.

3. Normal coronary origin with right dominance.

4.  Enlarged pulmonary artery, measured 42 mm prior to bifurcation.

5.  Aortic atherosclerosis.

INTERPRETATION:

1. CAD-RADS 0: No evidence of CAD (0%). Consider non-atherosclerotic
causes of chest pain.

2. CAD-RADS 1: Minimal non-obstructive CAD (0-24%). Consider
non-atherosclerotic causes of chest pain. Consider preventive
therapy and risk factor modification.

3. CAD-RADS 2: Mild non-obstructive CAD (25-49%). Consider
non-atherosclerotic causes of chest pain. Consider preventive
therapy and risk factor modification.

4. CAD-RADS 3: Moderate stenosis (50-69%). Consider symptom-guided
anti-ischemic pharmacotherapy as well as risk factor modification
per guideline directed care. Additional analysis with CT FFR will be
submitted.

5. CAD-RADS 4: Severe stenosis. (70-99% or > 50% left main). Cardiac
catheterization or CT FFR is recommended. Consider symptom-guided
anti-ischemic pharmacotherapy as well as risk factor modification
per guideline directed care. Invasive coronary angiography
recommended with revascularization per published guideline
statements.

6. CAD-RADS 5: Total coronary occlusion (100%). Consider cardiac
catheterization or viability assessment. Consider symptom-guided
anti-ischemic pharmacotherapy as well as risk factor modification
per guideline directed care.

7. CAD-RADS N: Non-diagnostic study. Obstructive CAD can't be
excluded. Alternative evaluation is recommended.

*** End of Addendum ***
EXAM:
OVER-READ INTERPRETATION  CT CHEST

The following report is an over-read performed by radiologist Dr.
Nekeisha Rawcash [REDACTED] on 12/11/2021. This
over-read does not include interpretation of cardiac or coronary
anatomy or pathology. The coronary calcium score/coronary CTA
interpretation by the cardiologist is attached.
FINDINGS: Limited view of the lung parenchyma demonstrates no suspicious
nodularity. Airways are normal.

Limited view of the mediastinum demonstrates no adenopathy.
Esophagus normal.

Limited view of the upper abdomen unremarkable.

Limited view of the skeleton and chest wall is unremarkable.
IMPRESSION: No significant extracardiac findings

## 2022-07-16 ENCOUNTER — Encounter: Payer: Self-pay | Admitting: Family Medicine

## 2022-07-17 NOTE — Telephone Encounter (Signed)
Pls call pt to make a CPE appt w/pcp, have not been seen since 11/22, which is getting close. However, pt needs to come in or pcp to give pt a new Rx for her hydrochlorthyzide  HCTZ, due to it's not even on her current med list.   Thank you.

## 2022-07-18 ENCOUNTER — Ambulatory Visit (INDEPENDENT_AMBULATORY_CARE_PROVIDER_SITE_OTHER): Payer: Medicare Other | Admitting: Family Medicine

## 2022-07-18 VITALS — BP 130/90 | HR 85 | Temp 98.5°F | Ht 67.0 in | Wt 236.0 lb

## 2022-07-18 DIAGNOSIS — E1169 Type 2 diabetes mellitus with other specified complication: Secondary | ICD-10-CM

## 2022-07-18 DIAGNOSIS — I2510 Atherosclerotic heart disease of native coronary artery without angina pectoris: Secondary | ICD-10-CM

## 2022-07-18 MED ORDER — HYDROCHLOROTHIAZIDE 25 MG PO TABS: 25.0000 mg | ORAL_TABLET | Freq: Every day | ORAL | 3 refills | Status: DC

## 2022-07-18 NOTE — Progress Notes (Signed)
Subjective:    Patient ID: Caitlyn [name], female    DOB: September 11, 1956, 66 y.o.   MRN: [id number]  HPI  Patient is here today for follow-up of her diabetes.  She is not checking her sugars regularly however when she does check her sugars typically between 100-140.  She denies any hypoglycemic episodes.  She denies any chest pain or shortness of breath.  Her blood pressure today is borderline at 130/90.   Past Medical History:  Diagnosis Date   Asthma    Cold and cough --- no fever   Complication of anesthesia    24 yrs ago post tubal-- pulm. edema  (no problems post bowel surg. 2009)   Depression    Diabetes mellitus    oral med   Diabetes mellitus without complication ([place])    [employer]reesia 11/28/2020   Diverticulitis hx  -- w/ perf. bowel   s/p resection 2009   Hypertension    Psoriasis    Reflux occasional   watches diet   Past Surgical History:  Procedure Laterality Date   BOWEL RESECTION  2009   diverticulitis w/ perf. bowel   COLON SURGERY N/A    [employer] 11/28/2020   HYSTEROSCOPY  10/09/2011   Procedure: HYSTEROSCOPY;  Surgeon: Margarette Asal;  Location: [facility];  Service: Gynecology;;  HYSTEROSCOPY D & C   TUBAL LIGATION  24 yrs ago   Current Outpatient Medications on File Prior to Visit  Medication Sig Dispense Refill   albuterol (VENTOLIN HFA) 108 (90 Base) MCG/ACT inhaler Inhale 2 puffs into the lungs every 4 (four) hours as needed for wheezing or shortness of breath. 8 g 2   amLODipine (NORVASC) 10 MG tablet TAKE 1 TABLET(10 MG) BY MOUTH DAILY 90 tablet 0   aspirin EC 81 MG tablet Take 1 tablet (81 mg total) by mouth daily. Swallow whole. 30 tablet 11   COSENTYX SENSOREADY 300 DOSE 150 MG/ML SOAJ Inject 150 mg into the muscle every 30 (thirty) days.     fexofenadine (ALLEGRA) 180 MG tablet Take 180 mg by mouth at bedtime.     Glucose Blood (BLOOD GLUCOSE TEST STRIPS) STRP Use as directed to monitor FSBS 4x daily. Dx: E11.65. 100 strip 1   metFORMIN  (GLUCOPHAGE) 1000 MG tablet TAKE 1 TABLET(1000 MG) BY MOUTH TWICE DAILY WITH A MEAL 180 tablet 0   metoprolol tartrate (LOPRESSOR) 100 MG tablet Take 2 hours prior to Cardiac CT 1 tablet 0   montelukast (SINGULAIR) 10 MG tablet TAKE 1 TABLET(10 MG) BY MOUTH AT BEDTIME 90 tablet 0   rosuvastatin (CRESTOR) 20 MG tablet Take 1 tablet (20 mg total) by mouth daily. 90 tablet 3   venlafaxine XR (EFFEXOR-XR) 150 MG 24 hr capsule TAKE 1 CAPSULE BY MOUTH EVERY DAY 90 capsule 0   mometasone (ELOCON) 0.1 % lotion Apply topically 2 (two) times daily. (Patient not taking: Reported on 07/18/2022)     sulfamethoxazole-trimethoprim (BACTRIM DS) 800-160 MG tablet Take 1 tablet by mouth 2 (two) times daily. (Patient not taking: Reported on 07/18/2022) 14 tablet 0   No current facility-administered medications on file prior to visit.     Allergies  Allergen Reactions   Erythromycin Other (See Comments)    Gi upset/ cramps Other reaction(s): Unknown   Hydrochlorothiazide     CAUSED GOUT  Advised to drink plenty of fluids to avoid crystallization in joints.    Pollen Extract    Macrolides And Ketolides Other (See Comments)  unknown   Social History   Socioeconomic History   Marital status: Married    Spouse name: Not on file   Number of children: Not on file   Years of education: Not on file   Highest education level: Not on file  Occupational History   Occupation: accountant  Tobacco Use   Smoking status: Never   Smokeless tobacco: Never  Vaping Use   Vaping Use: Never used  Substance and Sexual Activity   Alcohol use: No   Drug use: No   Sexual activity: Not on file  Other Topics Concern   Not on file  Social History Narrative   Lives with her husband   1-2 cups daily of Caffeine    Social Determinants of Health   Financial Resource Strain: Not on file  Food Insecurity: Not on file  Transportation Needs: Not on file  Physical Activity: Not on file  Stress: Not on file  Social  Connections: Not on file  Intimate Partner Violence: Not on file      Review of Systems     Objective:   Physical Exam Vitals reviewed.  Constitutional:      General: She is not in acute distress.    Appearance: Normal appearance. She is obese. She is not ill-appearing or toxic-appearing.  Cardiovascular:     Rate and Rhythm: Normal rate and regular rhythm.     Heart sounds: Normal heart sounds. No murmur heard.    No friction rub. No gallop.  Pulmonary:     Effort: Pulmonary effort is normal. No respiratory distress.     Breath sounds: Normal breath sounds. No stridor. No wheezing or rales.  Musculoskeletal:     Left wrist: No crepitus.  Neurological:     Mental Status: She is alert.        Assessment & Plan:  Type 2 diabetes mellitus with other specified complication, unspecified whether long term insulin use (HCC) - Plan: Hemoglobin A1c, CBC with Differential/Platelet, Lipid panel, COMPLETE METABOLIC PANEL WITH GFR, Microalbumin, urine Patient's blood acceptable.  Resume hydrochlorothiazide.  Check CBC, CMP, lipid panel, A1c, and urine microalbumin.  Ideally I like her LDL cholesterol to be below 100, her A1c to be below 6.5.  Monitor her renal function and potassium as she has a history of hyperkalemia

## 2022-07-19 LAB — LIPID PANEL
Cholesterol: 109 mg/dL (ref <200)
HDL: 58 mg/dL (ref >=50)
LDL Cholesterol (Calc): 32 mg/dL (calc)
Non-HDL Cholesterol (Calc): 51 mg/dL (calc) (ref <130)
Total CHOL/HDL Ratio: 1.9 (calc) (ref <5.0)
Triglycerides: 104 mg/dL (ref <150)

## 2022-07-19 LAB — COMPLETE METABOLIC PANEL WITH GFR
AG Ratio: 1.6 (calc) (ref 1.0–2.5)
ALT: 16 U/L (ref 6–29)
AST: 18 U/L (ref 10–35)
Albumin: 4.4 g/dL (ref 3.6–5.1)
Alkaline phosphatase (APISO): 70 U/L (ref 37–153)
BUN/Creatinine Ratio: 24 (calc) — ABNORMAL HIGH (ref 6–22)
BUN: 31 mg/dL — ABNORMAL HIGH (ref 7–25)
CO2: 24 mmol/L (ref 20–32)
Calcium: 9.7 mg/dL (ref 8.6–10.4)
Chloride: 103 mmol/L (ref 98–110)
Creat: 1.3 mg/dL — ABNORMAL HIGH (ref 0.50–1.05)
Globulin: 2.8 g/dL (calc) (ref 1.9–3.7)
Glucose, Bld: 79 mg/dL (ref 65–99)
Potassium: 5.4 mmol/L — ABNORMAL HIGH (ref 3.5–5.3)
Sodium: 138 mmol/L (ref 135–146)
Total Bilirubin: 0.4 mg/dL (ref 0.2–1.2)
Total Protein: 7.2 g/dL (ref 6.1–8.1)
eGFR: 46 mL/min/{1.73_m2} — ABNORMAL LOW (ref >=60)

## 2022-07-19 LAB — CBC WITH DIFFERENTIAL/PLATELET
Absolute Monocytes: 592 cells/uL (ref 200–950)
Basophils Absolute: 52 cells/uL (ref 0–200)
Basophils Relative: 0.6 %
Eosinophils Absolute: 270 cells/uL (ref 15–500)
Eosinophils Relative: 3.1 %
HCT: 34.2 % — ABNORMAL LOW (ref 35.0–45.0)
Hemoglobin: 11.7 g/dL (ref 11.7–15.5)
Lymphs Abs: 2288 cells/uL (ref 850–3900)
MCH: 30.5 pg (ref 27.0–33.0)
MCHC: 34.2 g/dL (ref 32.0–36.0)
MCV: 89.3 fL (ref 80.0–100.0)
MPV: 9.9 fL (ref 7.5–12.5)
Monocytes Relative: 6.8 %
Neutro Abs: 5498 cells/uL (ref 1500–7800)
Neutrophils Relative %: 63.2 %
Platelets: 329 10*3/uL (ref 140–400)
RBC: 3.83 10*6/uL (ref 3.80–5.10)
RDW: 12.4 % (ref 11.0–15.0)
Total Lymphocyte: 26.3 %
WBC: 8.7 10*3/uL (ref 3.8–10.8)

## 2022-07-19 LAB — HEMOGLOBIN A1C
Hgb A1c MFr Bld: 5.4 % of total Hgb (ref <5.7)
Mean Plasma Glucose: 108 mg/dL
eAG (mmol/L): 6 mmol/L

## 2022-07-19 LAB — MICROALBUMIN, URINE: Microalb, Ur: 5.6 mg/dL

## 2022-07-22 LAB — HM MAMMOGRAPHY

## 2022-07-22 LAB — HM PAP SMEAR: HM Pap smear: NEGATIVE

## 2022-07-22 LAB — HM DEXA SCAN: HM Dexa Scan: NORMAL

## 2022-07-26 ENCOUNTER — Encounter: Payer: Self-pay | Admitting: Family Medicine

## 2022-08-01 ENCOUNTER — Encounter: Payer: Self-pay | Admitting: Family Medicine

## 2022-08-07 ENCOUNTER — Telehealth: Payer: Self-pay

## 2022-08-07 NOTE — Telephone Encounter (Signed)
Called pt to schedule AWV. Pt declined. Mjp,lpn

## 2022-08-29 ENCOUNTER — Ambulatory Visit (INDEPENDENT_AMBULATORY_CARE_PROVIDER_SITE_OTHER): Payer: Medicare Other | Admitting: Family Medicine

## 2022-08-29 ENCOUNTER — Other Ambulatory Visit: Payer: Self-pay

## 2022-08-29 VITALS — BP 130/78 | HR 105 | Temp 98.2°F

## 2022-08-29 DIAGNOSIS — R062 Wheezing: Secondary | ICD-10-CM

## 2022-08-29 DIAGNOSIS — R300 Dysuria: Secondary | ICD-10-CM

## 2022-08-29 DIAGNOSIS — T7840XA Allergy, unspecified, initial encounter: Secondary | ICD-10-CM | POA: Diagnosis not present

## 2022-08-29 DIAGNOSIS — I2510 Atherosclerotic heart disease of native coronary artery without angina pectoris: Secondary | ICD-10-CM

## 2022-08-29 LAB — URINALYSIS, ROUTINE W REFLEX MICROSCOPIC
Bilirubin Urine: NEGATIVE
Glucose, UA: NEGATIVE
Hgb urine dipstick: NEGATIVE
Ketones, ur: NEGATIVE
Leukocytes,Ua: NEGATIVE
Nitrite: NEGATIVE
Specific Gravity, Urine: 1.018 (ref 1.001–1.035)
pH: 5.5 (ref 5.0–8.0)

## 2022-08-29 LAB — MICROSCOPIC MESSAGE

## 2022-08-29 MED ORDER — PREDNISONE 20 MG PO TABS: ORAL_TABLET | ORAL | 0 refills | Status: DC

## 2022-08-29 NOTE — Progress Notes (Signed)
Subjective:    Patient ID: Caitlyn [name], female    DOB: 26-Nov-1955, 66 y.o.   MRN: [id number]  HPI Last HgA1c was 5.4 in 8/23.  Has known stable CKD 3a.  Patient wonders why she has so many urinary tract infections.  She states she has had 4-5 this year.  She is currently on Bactrim for UTI.  Bactrim was recently prescribed in urgent care.  Urinalysis today shows no bacteria, no leukocyte esterase, no blood, no nitrates.  There is some protein.  Therefore she appears to be resolving.  She is feeling better.  She denies any association with intercourse.  She has been postmenopausal for more than 10 years.  She does report vaginal dryness.  However she also is concerned because she is coughing and wheezing.  She states that she has had sinus inflammation for the last 2 weeks with head congestion and rhinorrhea.  However recently she has been coughing excessively and wheezing.  She has a history of asthma Past Medical History:  Diagnosis Date   Asthma    Cold and cough --- no fever   Complication of anesthesia    24 yrs ago post tubal-- pulm. edema  (no problems post bowel surg. 2009)   Depression    Diabetes mellitus    oral med   Diabetes mellitus without complication ([place])    Phreesia 11/28/2020   Diverticulitis hx  -- w/ perf. bowel   s/p resection 2009   Hypertension    Psoriasis    Reflux occasional   watches diet   Past Surgical History:  Procedure Laterality Date   BOWEL RESECTION  2009   diverticulitis w/ perf. bowel   COLON SURGERY N/A    Phreesia 11/28/2020   HYSTEROSCOPY  10/09/2011   Procedure: HYSTEROSCOPY;  Surgeon: Margarette Asal;  Location: [facility];  Service: Gynecology;;  HYSTEROSCOPY D & C   TUBAL LIGATION  24 yrs ago   Current Outpatient Medications on File Prior to Visit  Medication Sig Dispense Refill   albuterol (VENTOLIN HFA) 108 (90 Base) MCG/ACT inhaler Inhale 2 puffs into the lungs every 4 (four) hours as needed for wheezing or  shortness of breath. 8 g 2   amLODipine (NORVASC) 10 MG tablet TAKE 1 TABLET(10 MG) BY MOUTH DAILY 90 tablet 0   aspirin EC 81 MG tablet Take 1 tablet (81 mg total) by mouth daily. Swallow whole. 30 tablet 11   COSENTYX SENSOREADY 300 DOSE 150 MG/ML SOAJ Inject 150 mg into the muscle every 30 (thirty) days.     fexofenadine (ALLEGRA) 180 MG tablet Take 180 mg by mouth at bedtime.     Glucose Blood (BLOOD GLUCOSE TEST STRIPS) STRP Use as directed to monitor FSBS 4x daily. Dx: E11.65. 100 strip 1   hydrochlorothiazide (HYDRODIURIL) 25 MG tablet Take 1 tablet (25 mg total) by mouth daily. 90 tablet 3   metFORMIN (GLUCOPHAGE) 1000 MG tablet TAKE 1 TABLET(1000 MG) BY MOUTH TWICE DAILY WITH A MEAL 180 tablet 0   metoprolol tartrate (LOPRESSOR) 100 MG tablet Take 2 hours prior to Cardiac CT 1 tablet 0   mometasone (ELOCON) 0.1 % lotion Apply topically 2 (two) times daily. (Patient not taking: Reported on 07/18/2022)     montelukast (SINGULAIR) 10 MG tablet TAKE 1 TABLET(10 MG) BY MOUTH AT BEDTIME 90 tablet 0   rosuvastatin (CRESTOR) 20 MG tablet Take 1 tablet (20 mg total) by mouth daily. 90 tablet 3   sulfamethoxazole-trimethoprim (BACTRIM DS)  800-160 MG tablet Take 1 tablet by mouth 2 (two) times daily. (Patient not taking: Reported on 07/18/2022) 14 tablet 0   venlafaxine XR (EFFEXOR-XR) 150 MG 24 hr capsule TAKE 1 CAPSULE BY MOUTH EVERY DAY 90 capsule 0   No current facility-administered medications on file prior to visit.     Allergies  Allergen Reactions   Erythromycin Other (See Comments)    Gi upset/ cramps Other reaction(s): Unknown   Hydrochlorothiazide     CAUSED GOUT  Advised to drink plenty of fluids to avoid crystallization in joints.    Pollen Extract    Macrolides And Ketolides Other (See Comments)    unknown   Social History   Socioeconomic History   Marital status: Married    Spouse name: Not on file   Number of children: Not on file   Years of education: Not on file    Highest education level: Not on file  Occupational History   Occupation: accountant  Tobacco Use   Smoking status: Never   Smokeless tobacco: Never  Vaping Use   Vaping Use: Never used  Substance and Sexual Activity   Alcohol use: No   Drug use: No   Sexual activity: Not on file  Other Topics Concern   Not on file  Social History Narrative   Lives with her husband   1-2 cups daily of Caffeine    Social Determinants of Health   Financial Resource Strain: Not on file  Food Insecurity: Not on file  Transportation Needs: Not on file  Physical Activity: Not on file  Stress: Not on file  Social Connections: Not on file  Intimate Partner Violence: Not on file      Review of Systems     Objective:   Physical Exam Vitals reviewed.  Constitutional:      General: She is not in acute distress.    Appearance: Normal appearance. She is obese. She is not ill-appearing or toxic-appearing.  HENT:     Right Ear: Tympanic membrane and ear canal normal.     Left Ear: Tympanic membrane and external ear normal.     Nose: Congestion and rhinorrhea present.  Cardiovascular:     Rate and Rhythm: Normal rate and regular rhythm.     Heart sounds: Normal heart sounds. No murmur heard.    No friction rub. No gallop.  Pulmonary:     Effort: Pulmonary effort is normal. No respiratory distress.     Breath sounds: No stridor. Wheezing present. No rales.  Musculoskeletal:     Left wrist: No crepitus.  Neurological:     Mental Status: She is alert.        Assessment & Plan:  Dysuria - Plan: Urinalysis, Routine w reflex microscopic  Wheezing due to allergy  First I think the patient is essentially having an allergy induced rhinosinusitis that is triggered an asthma attack.  I will treat the patient with a prednisone taper pack.  If not improving consider Augmentin for sinus infection.  Second I believe that she has had urinary tract infections due to fecal pathogens getting in the  bladder.  I explained the natural history of bladder infections.  If they become more frequent I would recommend urology consultation for cystoscopy to rule out anatomic variants and if the urology consultation is normal, I would recommend trying vaginal estrogen for atrophic vaginitis contributing to more frequent urinary tract infections.  At the present time the patient is comfortable just watching

## 2022-08-31 ENCOUNTER — Encounter: Payer: Self-pay | Admitting: Family Medicine

## 2022-09-02 ENCOUNTER — Encounter: Payer: Self-pay | Admitting: Family Medicine

## 2022-09-02 MED ORDER — ALBUTEROL SULFATE HFA 108 (90 BASE) MCG/ACT IN AERS: 2.0000 | INHALATION_SPRAY | RESPIRATORY_TRACT | 2 refills | Status: AC | PRN

## 2022-09-03 ENCOUNTER — Other Ambulatory Visit: Payer: Self-pay | Admitting: Family Medicine

## 2022-09-03 MED ORDER — AMOXICILLIN-POT CLAVULANATE 875-125 MG PO TABS: 1.0000 | ORAL_TABLET | Freq: Two times a day (BID) | ORAL | 0 refills | Status: DC

## 2022-09-15 ENCOUNTER — Encounter: Payer: Self-pay | Admitting: Family Medicine

## 2022-09-18 ENCOUNTER — Other Ambulatory Visit: Payer: Self-pay

## 2022-09-18 DIAGNOSIS — J45909 Unspecified asthma, uncomplicated: Secondary | ICD-10-CM

## 2022-09-18 DIAGNOSIS — R062 Wheezing: Secondary | ICD-10-CM

## 2022-09-18 DIAGNOSIS — J069 Acute upper respiratory infection, unspecified: Secondary | ICD-10-CM

## 2022-09-18 MED ORDER — BUDESONIDE-FORMOTEROL FUMARATE 160-4.5 MCG/ACT IN AERO: 2.0000 | INHALATION_SPRAY | Freq: Two times a day (BID) | RESPIRATORY_TRACT | 3 refills | Status: DC

## 2022-09-20 ENCOUNTER — Telehealth: Payer: Self-pay | Admitting: Family Medicine

## 2022-09-20 NOTE — Telephone Encounter (Signed)
Erroneous encounter. Please disregard.

## 2022-09-23 ENCOUNTER — Other Ambulatory Visit: Payer: Self-pay | Admitting: Family Medicine

## 2022-09-24 NOTE — Telephone Encounter (Signed)
Requested Prescriptions  Pending Prescriptions Disp Refills  . metFORMIN (GLUCOPHAGE) 1000 MG tablet [Pharmacy Med Name: METFORMIN 1000MG TABLETS] 180 tablet 0    Sig: TAKE 1 TABLET(1000 MG) BY MOUTH TWICE DAILY WITH A MEAL     Endocrinology:  Diabetes - Biguanides Failed - 09/23/2022  8:42 AM      Failed - Cr in normal range and within 360 days    Creat  Date Value Ref Range Status  07/18/2022 1.30 (H) 0.50 - 1.05 mg/dL Final   Creatinine, Urine  Date Value Ref Range Status  12/01/2020 91 20 - 275 mg/dL Final         Failed - eGFR in normal range and within 360 days    GFR, Est African American  Date Value Ref Range Status  01/01/2019 76 > OR = 60 mL/min/1.30m Final   GFR, Est Non African American  Date Value Ref Range Status  01/01/2019 66 > OR = 60 mL/min/1.761mFinal   GFR, Estimated  Date Value Ref Range Status  09/01/2021 48 (L) >60 mL/min Final    Comment:    (NOTE) Calculated using the CKD-EPI Creatinine Equation (2021)    eGFR  Date Value Ref Range Status  07/18/2022 46 (L) > OR = 60 mL/min/1.7362minal  11/30/2021 52 (L) >59 mL/min/1.73 Final         Failed - B12 Level in normal range and within 720 days    No results found for: "VITAMINB12"       Failed - Valid encounter within last 6 months    Recent Outpatient Visits          9 months ago [place]   10 months ago Upper respiratory tract infection, unspecified type   [facility] [place] [facility]   11 months ago Type 2 diabetes mellitus with other specified complication, unspecified whether long term insulin use ([place] [place] [facility]   1 year ago Type 2 diabetes mellitus with other specified complication, unspecified whether long term insulin use ([place] Mcgee[place]   1 year ago Ganglion cyst of dorsum of left wrist   [place] [facility] [place] [place]             Passed - HBA1C is between 0 and 7.9 and within 180 days    Hgb A1c MFr Bld  Date Value Ref Range Status  07/18/2022 5.4 <5.7 % of total Hgb Final    Comment:    For the purpose of screening for the presence of diabetes: . <5.7%       Consistent with the absence of diabetes 5.7-6.4%    Consistent with increased risk for diabetes             (prediabetes) > or =6.5%  Consistent with diabetes . This assay result is consistent with a decreased risk of diabetes. . Currently, no consensus exists regarding use of hemoglobin A1c for diagnosis of diabetes in children. . According to American Diabetes Association (ADA) guidelines, hemoglobin A1c <7.0% represents optimal control in non-pregnant diabetic patients. Different metrics may apply to specific patient populations.  Standards of Medical Care in Diabetes(ADA). .          Passed - CBC within normal limits and completed in the last 12 months    WBC  Date Value Ref Range  Status  07/18/2022 8.7 3.8 - 10.8 Thousand/uL Final   RBC  Date Value Ref Range Status  07/18/2022 3.83 3.80 - 5.10 Million/uL Final   Hemoglobin  Date Value Ref Range Status  07/18/2022 11.7 11.7 - 15.5 g/dL Final   HCT  Date Value Ref Range Status  07/18/2022 34.2 (L) 35.0 - 45.0 % Final   MCHC  Date Value Ref Range Status  07/18/2022 34.2 32.0 - 36.0 g/dL Final   [facility]  Date Value Ref Range Status  07/18/2022 30.5 27.0 - 33.0 pg Final   MCV  Date Value Ref Range Status  07/18/2022 89.3 80.0 - 100.0 fL Final   No results found for: "PLTCOUNTKUC", "LABPLAT", "POCPLA" RDW  Date Value Ref Range Status  07/18/2022 12.4 11.0 - 15.0 % Final

## 2022-10-19 ENCOUNTER — Other Ambulatory Visit: Payer: Self-pay | Admitting: Family Medicine

## 2022-11-06 ENCOUNTER — Encounter: Payer: Self-pay | Admitting: Family Medicine

## 2022-11-07 ENCOUNTER — Ambulatory Visit (INDEPENDENT_AMBULATORY_CARE_PROVIDER_SITE_OTHER): Payer: Medicare Other | Admitting: Family Medicine

## 2022-11-07 ENCOUNTER — Telehealth: Payer: Self-pay

## 2022-11-07 VITALS — BP 120/72 | HR 107 | Temp 97.8°F | Ht 67.0 in | Wt 237.0 lb

## 2022-11-07 DIAGNOSIS — I2510 Atherosclerotic heart disease of native coronary artery without angina pectoris: Secondary | ICD-10-CM | POA: Diagnosis not present

## 2022-11-07 DIAGNOSIS — R300 Dysuria: Secondary | ICD-10-CM

## 2022-11-07 MED ORDER — SULFAMETHOXAZOLE-TRIMETHOPRIM 800-160 MG PO TABS: 1.0000 | ORAL_TABLET | Freq: Two times a day (BID) | ORAL | 0 refills | Status: DC

## 2022-11-07 NOTE — Telephone Encounter (Signed)
My Chart Message from pt:  I have a uti. I just tested for it and it was positive. Please Would you phone in an RX for me?  Thank you very much.  I advised patient that she may need to come in for an appointment. Thank you.

## 2022-11-07 NOTE — Progress Notes (Signed)
Subjective:    Patient ID: Caitlyn [name], female    DOB: 15-Dec-1955, 66 y.o.   MRN: [id number]  Dysuria   Patient believes she has urinary tract infection.  She states for the last few days she has been having dysuria.  She states it burns every time she urinates.  After she urinates she feels bladder spasms and urgency like she has to urinate again.  She denies any back pain or fever or nausea or vomiting and there is no CVA tenderness on exam.  However she is unable to give [place] a urine sample today Past Medical History:  Diagnosis Date   Asthma    Cold and cough --- no fever   Complication of anesthesia    24 yrs ago post tubal-- pulm. edema  (no problems post bowel surg. 2009)   Depression    Diabetes mellitus    oral med   Diabetes mellitus without complication ([place])    Phreesia 11/28/2020   Diverticulitis hx  -- w/ perf. bowel   s/p resection 2009   Hypertension    Psoriasis    Reflux occasional   watches diet   Past Surgical History:  Procedure Laterality Date   BOWEL RESECTION  2009   diverticulitis w/ perf. bowel   COLON SURGERY N/A    Phreesia 11/28/2020   HYSTEROSCOPY  10/09/2011   Procedure: HYSTEROSCOPY;  Surgeon: Margarette Asal;  Location: [facility];  Service: Gynecology;;  HYSTEROSCOPY D & C   TUBAL LIGATION  24 yrs ago       Allergies  Allergen Reactions   Erythromycin Other (See Comments)    Gi upset/ cramps Other reaction(s): Unknown   Hydrochlorothiazide     CAUSED GOUT  Advised to drink plenty of fluids to avoid crystallization in joints.    Pollen Extract    Macrolides And Ketolides Other (See Comments)    unknown   Social History   Socioeconomic History   Marital status: Married    Spouse name: Not on file   Number of children: Not on file   Years of education: Not on file   Highest education level: Not on file  Occupational History   Occupation: accountant  Tobacco Use   Smoking status: Never   Smokeless tobacco:  Never  Vaping Use   Vaping Use: Never used  Substance and Sexual Activity   Alcohol use: No   Drug use: No   Sexual activity: Not on file  Other Topics Concern   Not on file  Social History Narrative   Lives with her husband   1-2 cups daily of Caffeine    Social Determinants of Health   Financial Resource Strain: Not on file  Food Insecurity: Not on file  Transportation Needs: Not on file  Physical Activity: Not on file  Stress: Not on file  Social Connections: Not on file  Intimate Partner Violence: Not on file      Review of Systems  Genitourinary:  Positive for dysuria.       Objective:   Physical Exam Vitals reviewed.  Constitutional:      General: She is not in acute distress.    Appearance: Normal appearance. She is obese. She is not ill-appearing or toxic-appearing.  Cardiovascular:     Rate and Rhythm: Normal rate and regular rhythm.     Heart sounds: Normal heart sounds. No murmur heard.    No friction rub. No gallop.  Pulmonary:     Effort:  Pulmonary effort is normal. No respiratory distress.     Breath sounds: No stridor. No wheezing or rales.  Abdominal:     General: Bowel sounds are normal. There is no distension.     Palpations: Abdomen is soft.     Tenderness: There is no abdominal tenderness. There is no guarding or rebound.  Musculoskeletal:     Left wrist: No crepitus.  Neurological:     Mental Status: She is alert.        Assessment & Plan:  Dysuria - Plan: Urinalysis, Routine w reflex microscopic Symptoms felt consistent with a urinary tract infection.  Begin Bactrim double strength tablets twice daily for 5 days.

## 2022-11-19 ENCOUNTER — Other Ambulatory Visit: Payer: Self-pay | Admitting: Family Medicine

## 2022-11-27 ENCOUNTER — Other Ambulatory Visit: Payer: Self-pay | Admitting: Family Medicine

## 2022-11-28 NOTE — Telephone Encounter (Signed)
Requested Prescriptions  Pending Prescriptions Disp Refills   amLODipine (NORVASC) 10 MG tablet [Pharmacy Med Name: AMLODIPINE BESYLATE 10MG  TABLETS] 90 tablet 0    Sig: TAKE 1 TABLET(10 MG) BY MOUTH DAILY     Cardiovascular: Calcium Channel Blockers 2 Failed - 11/27/2022  4:05 PM      Failed - Valid encounter within last 6 months    Recent Outpatient Visits           12 months ago [place] Eulogio Bear, NP   1 year ago Upper respiratory tract infection, unspecified type   [place] Eulogio Bear, NP   1 year ago Type 2 diabetes mellitus with other specified complication, unspecified whether long term insulin use ([place])   [place] Susy Frizzle, MD   1 year ago Type 2 diabetes mellitus with other specified complication, unspecified whether long term insulin use ([place])   [place] Pickard, Cammie Mcgee, MD   1 year ago Ganglion cyst of dorsum of left wrist   [place] Pickard, Cammie Mcgee, MD              Passed - Last BP in normal range    BP Readings from Last 1 Encounters:  11/07/22 120/72         Passed - Last Heart Rate in normal range    Pulse Readings from Last 1 Encounters:  11/07/22 (!) 107          montelukast (SINGULAIR) 10 MG tablet [Pharmacy Med Name: MONTELUKAST 10MG  TABLETS] 90 tablet 0    Sig: TAKE 1 TABLET(10 MG) BY MOUTH AT BEDTIME     Pulmonology:  Leukotriene Inhibitors Failed - 11/27/2022  4:05 PM      Failed - Valid encounter within last 12 months    Recent Outpatient Visits           12 months ago [place] Eulogio Bear, NP   1 year ago Upper respiratory tract infection, unspecified type   [facility] Eulogio Bear, NP   1 year ago Type 2 diabetes mellitus with other specified complication, unspecified whether long term insulin use ([place])   [place] Susy Frizzle, MD   1 year ago Type 2 diabetes mellitus with other specified complication, unspecified whether long term insulin use ([place])   [place] Pickard, Cammie Mcgee, MD   1 year ago Ganglion cyst of dorsum of left wrist   [place] Pickard, Cammie Mcgee, MD

## 2022-12-06 ENCOUNTER — Encounter: Payer: Self-pay | Admitting: Family Medicine

## 2022-12-26 LAB — LAB REPORT - SCANNED: EGFR: 41

## 2022-12-30 NOTE — Patient Instructions (Signed)
Ms. Haack , Thank you for taking time to come for your Medicare Wellness Visit. I appreciate your ongoing commitment to your health goals. Please review the following plan we discussed and let me know if I can assist you in the future.   These are the goals we discussed:  Goals   None     This is a list of the screening recommended for you and due dates:  Health Maintenance  Topic Date Due   Medicare Annual Wellness Visit  Never done   DTaP/Tdap/Td vaccine (1 - Tdap) Never done   Zoster (Shingles) Vaccine (1 of 2) Never done   Complete foot exam   12/01/2021   Pneumonia Vaccine (1 - PCV) Never done   Flu Shot  06/25/2022   COVID-19 Vaccine (4 - 2023-24 season) 07/26/2022   Eye exam for diabetics  11/30/2022   Hepatitis C Screening: USPSTF Recommendation to screen - Ages 18-79 yo.  08/30/2023*   Yearly kidney health urinalysis for diabetes  08/30/2027*   Hemoglobin A1C  01/18/2023   Yearly kidney function blood test for diabetes  07/19/2023   Mammogram  07/22/2024   Colon Cancer Screening  01/30/2028   DEXA scan (bone density measurement)  Completed   HPV Vaccine  Aged Out  *Topic was postponed. The date shown is not the original due date.    Advanced directives:   Conditions/risks identified: Aim for 30 minutes of exercise or brisk walking, 6-8 glasses of water, and 5 servings of fruits and vegetables each day.   Next appointment: Follow up in one year for your annual wellness visit    Preventive Care 65 Years and Older, Female Preventive care refers to lifestyle choices and visits with your health care provider that can promote health and wellness. What does preventive care include? A yearly physical exam. This is also called an annual well check. Dental exams once or twice a year. Routine eye exams. Ask your health care provider how often you should have your eyes checked. Personal lifestyle choices, including: Daily care of your teeth and gums. Regular physical  activity. Eating a healthy diet. Avoiding tobacco and drug use. Limiting alcohol use. Practicing safe sex. Taking low-dose aspirin every day. Taking vitamin and mineral supplements as recommended by your health care provider. What happens during an annual well check? The services and screenings done by your health care provider during your annual well check will depend on your age, overall health, lifestyle risk factors, and family history of disease. Counseling  Your health care provider may ask you questions about your: Alcohol use. Tobacco use. Drug use. Emotional well-being. Home and relationship well-being. Sexual activity. Eating habits. History of falls. Memory and ability to understand (cognition). Work and work Statistician. Reproductive health. Screening  You may have the following tests or measurements: Height, weight, and BMI. Blood pressure. Lipid and cholesterol levels. These may be checked every 5 years, or more frequently if you are over 69 years old. Skin check. Lung cancer screening. You may have this screening every year starting at age 82 if you have a 30-pack-year history of smoking and currently smoke or have quit within the past 15 years. Fecal occult blood test (FOBT) of the stool. You may have this test every year starting at age 42. Flexible sigmoidoscopy or colonoscopy. You may have a sigmoidoscopy every 5 years or a colonoscopy every 10 years starting at age 66. Hepatitis C blood test. Hepatitis B blood test. Sexually transmitted disease (STD) testing. Diabetes screening.  This is done by checking your blood sugar (glucose) after you have not eaten for a while (fasting). You may have this done every 1-3 years. Bone density scan. This is done to screen for osteoporosis. You may have this done starting at age 27. Mammogram. This may be done every 1-2 years. Talk to your health care provider about how often you should have regular mammograms. Talk with your  health care provider about your test results, treatment options, and if necessary, the need for more tests. Vaccines  Your health care provider may recommend certain vaccines, such as: Influenza vaccine. This is recommended every year. Tetanus, diphtheria, and acellular pertussis (Tdap, Td) vaccine. You may need a Td booster every 10 years. Zoster vaccine. You may need this after age 36. Pneumococcal 13-valent conjugate (PCV13) vaccine. One dose is recommended after age 61. Pneumococcal polysaccharide (PPSV23) vaccine. One dose is recommended after age 63. Talk to your health care provider about which screenings and vaccines you need and how often you need them. This information is not intended to replace advice given to you by your health care provider. Make sure you discuss any questions you have with your health care provider. Document Released: 12/08/2015 Document Revised: 07/31/2016 Document Reviewed: 09/12/2015 Elsevier Interactive Patient Education  2017 [place] Prevention in the Home Falls can cause injuries. They can happen to people of all ages. There are many things you can do to make your home safe and to help prevent falls. What can I do on the outside of my home? Regularly fix the edges of walkways and driveways and fix any cracks. Remove anything that might make you trip as you walk through a door, such as a raised step or threshold. Trim any bushes or trees on the path to your home. Use bright outdoor lighting. Clear any walking paths of anything that might make someone trip, such as rocks or tools. Regularly check to see if handrails are loose or broken. Make sure that both sides of any steps have handrails. Any raised decks and porches should have guardrails on the edges. Have any leaves, snow, or ice cleared regularly. Use sand or salt on walking paths during winter. Clean up any spills in your garage right away. This includes oil or grease spills. What can I  do in the bathroom? Use night lights. Install grab bars by the toilet and in the tub and shower. Do not use towel bars as grab bars. Use non-skid mats or decals in the tub or shower. If you need to sit down in the shower, use a plastic, non-slip stool. Keep the floor dry. Clean up any water that spills on the floor as soon as it happens. Remove soap buildup in the tub or shower regularly. Attach bath mats securely with double-sided non-slip rug tape. Do not have throw rugs and other things on the floor that can make you trip. What can I do in the bedroom? Use night lights. Make sure that you have a light by your bed that is easy to reach. Do not use any sheets or blankets that are too big for your bed. They should not hang down onto the floor. Have a firm chair that has side arms. You can use this for support while you get dressed. Do not have throw rugs and other things on the floor that can make you trip. What can I do in the kitchen? Clean up any spills right away. Avoid walking on wet floors. Keep items  that you use a lot in easy-to-reach places. If you need to reach something above you, use a strong step stool that has a grab bar. Keep electrical cords out of the way. Do not use floor polish or wax that makes floors slippery. If you must use wax, use non-skid floor wax. Do not have throw rugs and other things on the floor that can make you trip. What can I do with my stairs? Do not leave any items on the stairs. Make sure that there are handrails on both sides of the stairs and use them. Fix handrails that are broken or loose. Make sure that handrails are as long as the stairways. Check any carpeting to make sure that it is firmly attached to the stairs. Fix any carpet that is loose or worn. Avoid having throw rugs at the top or bottom of the stairs. If you do have throw rugs, attach them to the floor with carpet tape. Make sure that you have a light switch at the top of the stairs  and the bottom of the stairs. If you do not have them, ask someone to add them for you. What else can I do to help prevent falls? Wear shoes that: Do not have high heels. Have rubber bottoms. Are comfortable and fit you well. Are closed at the toe. Do not wear sandals. If you use a stepladder: Make sure that it is fully opened. Do not climb a closed stepladder. Make sure that both sides of the stepladder are locked into place. Ask someone to hold it for you, if possible. Clearly mark and make sure that you can see: Any grab bars or handrails. First and last steps. Where the edge of each step is. Use tools that help you move around (mobility aids) if they are needed. These include: Canes. Walkers. Scooters. Crutches. Turn on the lights when you go into a dark area. Replace any light bulbs as soon as they burn out. Set up your furniture so you have a clear path. Avoid moving your furniture around. If any of your floors are uneven, fix them. If there are any pets around you, be aware of where they are. Review your medicines with your doctor. Some medicines can make you feel dizzy. This can increase your chance of falling. Ask your doctor what other things that you can do to help prevent falls. This information is not intended to replace advice given to you by your health care provider. Make sure you discuss any questions you have with your health care provider. Document Released: 09/07/2009 Document Revised: 04/18/2016 Document Reviewed: 12/16/2014 Elsevier Interactive Patient Education  2017 [employer].

## 2022-12-31 ENCOUNTER — Ambulatory Visit: Payer: Medicare Other

## 2023-01-14 ENCOUNTER — Telehealth: Payer: Self-pay

## 2023-01-14 NOTE — Telephone Encounter (Signed)
Received sleep TOC referral from [facility]. Placed in sleep lab mailbox

## 2023-02-17 ENCOUNTER — Ambulatory Visit (INDEPENDENT_AMBULATORY_CARE_PROVIDER_SITE_OTHER): Payer: Medicare Other | Admitting: Neurology

## 2023-02-17 ENCOUNTER — Encounter: Payer: Self-pay | Admitting: Neurology

## 2023-02-17 ENCOUNTER — Other Ambulatory Visit: Payer: Self-pay | Admitting: Family Medicine

## 2023-02-17 VITALS — BP 143/86 | HR 91 | Ht 66.0 in | Wt 231.0 lb

## 2023-02-17 DIAGNOSIS — J4520 Mild intermittent asthma, uncomplicated: Secondary | ICD-10-CM

## 2023-02-17 DIAGNOSIS — F40240 Claustrophobia: Secondary | ICD-10-CM | POA: Diagnosis not present

## 2023-02-17 DIAGNOSIS — Z789 Other specified health status: Secondary | ICD-10-CM | POA: Insufficient documentation

## 2023-02-17 DIAGNOSIS — G4733 Obstructive sleep apnea (adult) (pediatric): Secondary | ICD-10-CM | POA: Diagnosis not present

## 2023-02-17 DIAGNOSIS — F458 Other somatoform disorders: Secondary | ICD-10-CM

## 2023-02-17 DIAGNOSIS — M2609 Other specified anomalies of jaw size: Secondary | ICD-10-CM

## 2023-02-17 NOTE — Patient Instructions (Signed)

## 2023-02-17 NOTE — Progress Notes (Signed)
SLEEP MEDICINE CLINIC    Provider:  Larey Seat, MD  Primary Care Physician:  Susy Frizzle, MD [address] [place] [address]    Primary Neurologist : Dr Leonie Man Frann Rider , NP  Referring Provider: [facility] from Dr Merlene Laughter , MD -          Chief Complaint according to patient   Patient presents with:     New Patient (Initial Visit)           HISTORY OF PRESENT ILLNESS:  Caitlyn [name] is a 67 y.o. female patient who is seen upon a transfer of care request by PCP on 02/17/2023 , transfer of Sleep Care from [facility].   The patient has been followed by the STROKE MD team here at [facility], last seen 2022.    Chief concern according to patient :     I have the pleasure of seeing Caitlyn [name] [birth year] a right-handed female with a possible sleep disorder.  She denies her migraines were ever sleep related , has seen Dr Leonie Man and Frann Rider for that.  The patient also suffers from asthma and has been followed by her family doctor for this she has and also albuterol inhaler.  She is followed for psoriasis by Dr. Camillo Flaming, MD. The patient underwent a vitrectomy after complications from a previous cardiac surgery in February 2022 doing the procedure per surgeon noted a large collar size and that she snored he recommended a sleep study.  She does have occasional hypertension, her BMI at the time was 41 and she has a history of diabetes mellitus type 2.  She does not have restless legs.  She reported awakening 3-4 times each night if she wakes up too early she is unable to go back to sleep.  On weekdays she arises at 5 AM she has retired in the meantime and often sleeps an hour longer than she.  Sometimes she has made up now to 8 or 9 AM.  She has never had sleep paralysis she does not have extremely vivid dreams or nightmares on a frequent basis no sleep hallucinations.  Her snoring has been loud enough to be disruptive to others especially if she is sleeping  in supine.  She has woken sometimes feeling show.  [name] neurology therefore ordered a sleep study based on this history and this was done as a snap study out of state physician that interpreted the study works in [place].  The service date was Apr 12, 2021 and her AHI for 4% oxygen desaturations were 30 her nadir of oxygen 83% and the vast majority of her events were hypopneas or shallow breathing spells.  These made 31.6/h but a clear-cut apnea was only present 4.9 times per hour.  The analyzed sleep time of the study was only 268 minutes.  The patient was then placed on a CPAP auto titration device by [employer].  The set up date would have been January 24th 2023.  DME changed the mask several times but she hated using CPAP and she quit. I have here the data from January through February of this year when she was last compliant and the patient used the machine 27 out of 30 days and 87% of the time over 4 hours with an average of 6 hours 30 minutes.  She was placed on an AutoSet with a minimum pressure of 8 and a maximum pressure of 20 cmH2O with 2 cm EPR her  residual AHI was 2.9 and there were some central apneas arising.  Air leak was mild the 95th percentile pressure was 10.7 cm water again residual AHI was 2.9.  She feels as if she is suffocating and cannot get herself to use it again. She stated her sleep physician never worked with her .   I reviewed her current medications.   Sleep relevant medical history: Nocturia 2-3 , Obesity BMI is 37, Tonsillectomy and ear tubes by 4-6 age , asthma , congestion of nasal passage airflow at night.     Family medical /sleep history:  No family member on CPAP with OSA.   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     The patient used to work in an office. Tobacco use: none .  ETOH use ;none ,  Caffeine intake in form of Coffee( /) Soda( /) Tea ( 2 cups a day) or energy drinks Exercise in form of - none.     Sleep habits are as follows: The patient's dinner time is between 3-5 PM. The patient goes to bed at 9-10 PM and continues to sleep for 3-4 hours, wakes for 2-3 bathroom breaks.  The preferred sleep position is laterally but ends up supine , with the support of 2 pillows.  Dreams are reportedly rare/ infrequent.  The patient wakes up spontaneously - now at 8 AM , 8  AM is the usual rise time. She reports not feeling refreshed or restored in AM, with symptoms such as dry mouth,and residual fatigue. Naps are taken frequently, lasting from 15-88m- under an hours- these are refreshing .    Review of Systems: Out of a complete 14 system review, the patient complains of only the following symptoms, and all other reviewed systems are negative.:  Fatigue, sleepiness , snoring,  OSA- hypopneas- fragmented sleep, Insomnia to stay asleep  Nocturia    How likely are you to doze in the following situations: 0 = not likely, 1 = slight chance, 2 = moderate chance, 3 = high chance   Sitting and Reading? Watching Television? Sitting inactive in a public place (theater or meeting)? As a passenger in a car for an hour without a break? Lying down in the afternoon when circumstances permit? Sitting and talking to someone? Sitting quietly after lunch without alcohol? In a car, while stopped for a few minutes in traffic?   Total = 7/ 24 points   FSS endorsed at 27/ 63 points.  GDS at 4/ 15 .   Social History   Socioeconomic History   Marital status: Married    Spouse name: Not on file   Number of children: Not on file   Years of education: Not on file   Highest education level: Not on file  Occupational History   Occupation: accountant  Tobacco Use   Smoking  status: Never   Smokeless tobacco: Never  Vaping Use   Vaping Use: Never used  Substance and Sexual Activity   Alcohol use: No   Drug use: No   Sexual activity: Not on file  Other Topics Concern   Not on file  Social History Narrative   Lives with her husband   1-2  cups daily of Caffeine    Social Determinants of Health   Financial Resource Strain: Not on file  Food Insecurity: Not on file  Transportation Needs: Not on file  Physical Activity: Not on file  Stress: Not on file  Social Connections: Not on file    Family History  Problem Relation Age of Onset   Stroke Father    Diabetes Brother     Past Medical History:  Diagnosis Date   Asthma    Cold and cough --- no fever   Complication of anesthesia    24 yrs ago post tubal-- pulm. edema  (no problems post bowel surg. 2009)   Depression    Migraine chronic.       Diabetes mellitus 2     Phreesia 11/28/2020   Diverticulitis hx  -- w/ perf. bowel   s/p resection 2009   Hypertension    Psoriasis , dermatological manifestation only     Reflux- GERD- acid ,  occasional   watches diet    Past Surgical History:  Procedure Laterality Date   BOWEL RESECTION  2009   diverticulitis w/ perf. bowel   COLON SURGERY N/A    Phreesia 11/28/2020   HYSTEROSCOPY  10/09/2011   Procedure: HYSTEROSCOPY;  Surgeon: Margarette Asal;  Location: [facility];  Service: Gynecology;;  HYSTEROSCOPY D & C   TUBAL LIGATION  24 yrs ago     Current Outpatient Medications on File Prior to Visit  Medication Sig Dispense Refill   albuterol (VENTOLIN HFA) 108 (90 Base) MCG/ACT inhaler Inhale 2 puffs into the lungs every 4 (four) hours as needed for wheezing or shortness of breath. 8 g 2   amLODipine (NORVASC) 10 MG tablet TAKE 1 TABLET(10 MG) BY MOUTH DAILY 90 tablet 0   aspirin EC 81 MG tablet Take 1 tablet (81 mg total) by mouth daily. Swallow whole. 30 tablet 11   COSENTYX SENSOREADY 300 DOSE 150 MG/ML SOAJ Inject 150 mg  into the muscle every 30 (thirty) days.     fexofenadine (ALLEGRA) 180 MG tablet Take 180 mg by mouth at bedtime.     Glucose Blood (BLOOD GLUCOSE TEST STRIPS) STRP Use as directed to monitor FSBS 4x daily. Dx: E11.65. 100 strip 1   hydrochlorothiazide (HYDRODIURIL) 25 MG tablet Take 1 tablet (25 mg total) by mouth daily. 90 tablet 3   metFORMIN (GLUCOPHAGE) 1000 MG tablet TAKE 1 TABLET(1000 MG) BY MOUTH TWICE DAILY WITH A MEAL 180 tablet 0   montelukast (SINGULAIR) 10 MG tablet TAKE 1 TABLET(10 MG) BY MOUTH AT BEDTIME 90 tablet 0   rosuvastatin (CRESTOR) 20 MG tablet TAKE 1 TABLET(20 MG) BY MOUTH DAILY 90 tablet 3   venlafaxine XR (EFFEXOR-XR) 150 MG 24 hr capsule TAKE 1 CAPSULE BY MOUTH EVERY DAY 90 capsule 0   No current facility-administered medications on file prior to visit.    Allergies  Allergen Reactions   Erythromycin Other (See Comments)    Gi upset/ cramps Other reaction(s): Unknown   Hydrochlorothiazide     CAUSED GOUT  Advised to drink plenty of fluids to avoid crystallization in joints.    Pollen Extract      DIAGNOSTIC DATA (LABS, IMAGING, TESTING) - I reviewed patient records, labs, notes, testing and imaging myself where available.  Lab Results  Component Value Date   WBC 8.7 07/18/2022   HGB 11.7 07/18/2022   HCT 34.2 (L) 07/18/2022   MCV 89.3 07/18/2022   PLT 329 07/18/2022  Component Value Date/Time   NA 138 07/18/2022 1619   NA 138 11/30/2021 0919   K 5.4 (H) 07/18/2022 1619   CL 103 07/18/2022 1619   CO2 24 07/18/2022 1619   GLUCOSE 79 07/18/2022 1619   BUN 31 (H) 07/18/2022 1619   BUN 26 11/30/2021 0919   CREATININE 1.30 (H) 07/18/2022 1619   CALCIUM 9.7 07/18/2022 1619   PROT 7.2 07/18/2022 1619   ALBUMIN 4.4 09/27/2016 0831   AST 18 07/18/2022 1619   ALT 16 07/18/2022 1619   ALKPHOS 61 09/27/2016 0831   BILITOT 0.4 07/18/2022 1619   GFRNONAA 48 (L) 09/01/2021 1308   GFRNONAA 66 01/01/2019 0829   GFRAA 76 01/01/2019 0829   Lab  Results  Component Value Date   CHOL 109 07/18/2022   HDL 58 07/18/2022   LDLCALC 32 07/18/2022   TRIG 104 07/18/2022   CHOLHDL 1.9 07/18/2022   Lab Results  Component Value Date   HGBA1C 5.4 07/18/2022   No results found for: "VITAMINB12" No results found for: "TSH"  PHYSICAL EXAM:  Today's Vitals   02/17/23 1015  BP: (!) 143/86  Pulse: 91  Weight: 231 lb (104.8 kg)  Height: 5\' 6"  (1.676 m)   Body mass index is 37.28 kg/m.   Wt Readings from Last 3 Encounters:  02/17/23 231 lb (104.8 kg)  11/07/22 237 lb (107.5 kg)  07/18/22 236 lb (107 kg)     Ht Readings from Last 3 Encounters:  02/17/23 5\' 6"  (1.676 m)  11/07/22 5\' 7"  (1.702 m)  07/18/22 5\' 7"  (1.702 m)      General: The patient is awake, alert and appears not in acute distress. The patient is well groomed. Head: Normocephalic, atraumatic. Neck is supple.  Mallampati 3 plus ,  neck circumference:15.5  inches . Nasal airflow is patent. Septal deviation is noted.  Retrognathia is  seen.  Wears invisalines.  Dental status: biological, narrow dentition,  lower jaw.  Cardiovascular:  Regular rate and cardiac rhythm by pulse,  without distended neck veins. Respiratory: Lungs are clear to auscultation.  Skin:  Without evidence of ankle edema, or rash. Trunk: The patient's posture is erect.   NEUROLOGIC EXAM: The patient is awake and alert, oriented to place and time.   Memory subjective described as intact.  Attention span & concentration ability appears normal.  Speech is fluent,  without  dysarthria, dysphonia or aphasia.  Mood and affect are appropriate.   Cranial nerves: no loss of smell or taste reported  Pupils are equal and briskly reactive to light. Funduscopic exam deferred..  Extraocular movements in vertical and horizontal planes were intact and without nystagmus. No Diplopia. Visual fields by finger perimetry are intact. Hearing was intact to soft voice and finger rubbing.    Facial sensation  intact to fine touch.  Facial motor strength is symmetric and tongue and uvula move midline.  Neck ROM : rotation, tilt and flexion extension were normal for age and shoulder shrug was symmetrical.    Motor exam:  Symmetric bulk, tone and ROM.   Normal tone without cog wheeling, symmetrically decreased  grip strength .   Sensory:  Vibration was reduced at the ankles.  Proprioception tested in the upper extremities was normal.   Coordination: Rapid alternating movements in the fingers/hands were of normal speed.  The Finger-to-nose maneuver was intact without evidence of ataxia, dysmetria or tremor.   Gait and station: Patient could rise unassisted from a seated position, walked without assistive device.  Stance is of normal width/ base and the patient turned with 3 steps.  Toe and heel walk were deferred.  Deep tendon reflexes: in the  upper and lower extremities are symmetric and intact.  Babinski response was deferred .    ASSESSMENT AND PLAN 67 y.o. year old female  here with:    1)  CPAP non-compliance - had been one year on CPAP and quit using it in February- stating it causes her to suffocate. Air is felt coming in through the nose bt she feels she cannot breathe. AEROPHAGIA reported.   2) patient has  OSA risk factors of obesity, small upper airway and is loudly snoring in supine- her sleep study was a SNAP study and revealed hypopneas in 2022.   3) her current setting starts at 8 cm water (?) and she uses a nasal cradle [facility] N I . No chin strap ever tried. Claustrophobia. She never could tolerate a FFM. .    I plan to order an in lab titration to CPAP and would like her to bring her current mask with her, we will order a chin strap and I will reset the machine for now to 5-16 cm water with 3 cm EPR. follow up either personally or through our NP within 3-4  months.   I would like to thank Susy Frizzle, MD and Dr Leonie Man NP [place] for allowing me to meet with and to take  care of this pleasant patient.   CC: I will share my notes with PCP.  After spending a total time of  45  minutes face to face and additional time for physical and neurologic examination, review of laboratory studies,  personal review of imaging studies, reports and results of other testing and review of referral information / records as far as provided in visit,   Electronically signed by: Larey Seat, MD 02/17/2023 10:27 AM  [facility] Board certified by The [employer] of Sleep Medicine and Diplomate of the [employer] of Sleep Medicine. Board certified In Neurology through the [place], Fellow of the [employer] of Neurology. Medical Director of [employer] [facility].

## 2023-02-18 ENCOUNTER — Telehealth: Payer: Self-pay

## 2023-02-18 NOTE — Telephone Encounter (Signed)
Requested Prescriptions  Pending Prescriptions Disp Refills   montelukast (SINGULAIR) 10 MG tablet [Pharmacy Med Name: MONTELUKAST 10MG  TABLETS] 90 tablet 0    Sig: TAKE 1 TABLET(10 MG) BY MOUTH AT BEDTIME     Pulmonology:  Leukotriene Inhibitors Failed - 02/17/2023  7:02 AM      Failed - Valid encounter within last 12 months    Recent Outpatient Visits           1 year ago [place] Eulogio Bear, NP   1 year ago Upper respiratory tract infection, unspecified type   [place] Eulogio Bear, NP   1 year ago Type 2 diabetes mellitus with other specified complication, unspecified whether long term insulin use ([place])   [place] Susy Frizzle, MD   1 year ago Type 2 diabetes mellitus with other specified complication, unspecified whether long term insulin use ([place])   [place] Pickard, Cammie Mcgee, MD   1 year ago Ganglion cyst of dorsum of left wrist   [place] Pickard, Cammie Mcgee, MD               amLODipine (NORVASC) 10 MG tablet [Pharmacy Med Name: AMLODIPINE BESYLATE 10MG  TABLETS] 90 tablet 0    Sig: TAKE 1 TABLET(10 MG) BY MOUTH DAILY     Cardiovascular: Calcium Channel Blockers 2 Failed - 02/17/2023  7:02 AM      Failed - Valid encounter within last 6 months    Recent Outpatient Visits           1 year ago [place] Eulogio Bear, NP   1 year ago Upper respiratory tract infection, unspecified type   [place] Noemi Chapel A, NP   1 year ago Type 2 diabetes mellitus with other specified complication, unspecified whether long term insulin use ([place])   [place] Pickard, Cammie Mcgee, MD   1 year ago Type 2 diabetes mellitus with other specified complication, unspecified whether long term insulin use ([place])   [place] Pickard, Cammie Mcgee, MD   1 year ago Ganglion cyst of  dorsum of left wrist   [place], Cammie Mcgee, MD              Passed - Last BP in normal range    BP Readings from Last 1 Encounters:  02/17/23 (!) 143/86         Passed - Last Heart Rate in normal range    Pulse Readings from Last 1 Encounters:  02/17/23 91

## 2023-03-19 ENCOUNTER — Telehealth: Payer: Self-pay | Admitting: Neurology

## 2023-03-19 NOTE — Telephone Encounter (Signed)
03/18/23 left VM [place] 02/27/23 Medicare & [place] no auth req EE

## 2023-04-03 ENCOUNTER — Other Ambulatory Visit: Payer: Self-pay | Admitting: Family Medicine

## 2023-04-03 NOTE — Telephone Encounter (Signed)
Requested medication (s) are due for refill today: yes  Requested medication (s) are on the active medication list: yes  Last refill:  11/19/22 #90  Future visit scheduled: no  Notes to clinic:  is this a pt at [facility]? Unsure. Spoke with TL and advised to send back to have office make sure.    Requested Prescriptions  Pending Prescriptions Disp Refills   venlafaxine XR (EFFEXOR-XR) 150 MG 24 hr capsule [Pharmacy Med Name: VENLAFAXINE ER 150MG  CAPSULES] 90 capsule 0    Sig: TAKE 1 CAPSULE BY MOUTH EVERY DAY     Psychiatry: Antidepressants - SNRI - desvenlafaxine & venlafaxine Failed - 04/03/2023  3:51 AM      Failed - Cr in normal range and within 360 days    Creat  Date Value Ref Range Status  07/18/2022 1.30 (H) 0.50 - 1.05 mg/dL Final   Creatinine, Urine  Date Value Ref Range Status  12/01/2020 91 20 - 275 mg/dL Final         Failed - Last BP in normal range    BP Readings from Last 1 Encounters:  02/17/23 (!) 143/86         Failed - Valid encounter within last 6 months    Recent Outpatient Visits           1 year ago Dysuria   [facility] Valentino Nose, NP   1 year ago Upper respiratory tract infection, unspecified type   [facility] Valentino Nose, NP   1 year ago Type 2 diabetes mellitus with other specified complication, unspecified whether long term insulin use (HCC)   [facility] Pickard, Priscille Heidelberg, MD   1 year ago Type 2 diabetes mellitus with other specified complication, unspecified whether long term insulin use (HCC)   [facility] Pickard, Priscille Heidelberg, MD   1 year ago Ganglion cyst of dorsum of left wrist   [facility] Pickard, Priscille Heidelberg, MD       Future Appointments             In 4 weeks Christell Constant, MD [facility]            Failed - Lipid Panel in normal range within the last 12 months    Cholesterol,  Total  Date Value Ref Range Status  03/04/2022 108 100 - 199 mg/dL Final   Cholesterol  Date Value Ref Range Status  07/18/2022 109 <200 mg/dL Final   LDL Cholesterol (Calc)  Date Value Ref Range Status  07/18/2022 32 mg/dL (calc) Final    Comment:    Reference range: <100 . Desirable range <100 mg/dL for primary prevention;   <70 mg/dL for patients with CHD or diabetic patients  with > or = 2 CHD risk factors. Marland Kitchen LDL-C is now calculated using the Martin-Hopkins  calculation, which is a validated novel method providing  better accuracy than the Friedewald equation in the  estimation of LDL-C.  Horald Pollen et al. Lenox Ahr. 7829;562(13): 2061-2068  (http://education.[facility].com/faq/FAQ164)    HDL  Date Value Ref Range Status  07/18/2022 58 > OR = 50 mg/dL Final  08/65/7846 58 >96 mg/dL Final   Triglycerides  Date Value Ref Range Status  07/18/2022 104 <150 mg/dL Final

## 2023-04-16 NOTE — Telephone Encounter (Signed)
I spoke with the patient and informed her I had a cancellation for 04/20/23 at  8 pm and I offered it to her. She stated she would take it.

## 2023-04-17 NOTE — Telephone Encounter (Signed)
Patient called and left a voicemail on my phone  stating she does not want to pursue the sleep study and wanted to cancel the study.

## 2023-04-22 ENCOUNTER — Other Ambulatory Visit: Payer: Self-pay | Admitting: Family Medicine

## 2023-04-23 LAB — HM DIABETES EYE EXAM

## 2023-05-01 ENCOUNTER — Ambulatory Visit: Payer: Medicare Other | Admitting: Internal Medicine

## 2023-05-16 ENCOUNTER — Other Ambulatory Visit: Payer: Self-pay | Admitting: Family Medicine

## 2023-05-18 ENCOUNTER — Other Ambulatory Visit: Payer: Self-pay | Admitting: Family Medicine

## 2023-05-19 ENCOUNTER — Encounter: Payer: Self-pay | Admitting: Family Medicine

## 2023-05-20 ENCOUNTER — Other Ambulatory Visit: Payer: Self-pay

## 2023-05-20 MED ORDER — HYDROCHLOROTHIAZIDE 25 MG PO TABS: 25.0000 mg | ORAL_TABLET | Freq: Every day | ORAL | 1 refills | Status: DC

## 2023-06-03 ENCOUNTER — Other Ambulatory Visit: Payer: Medicare Other

## 2023-06-03 DIAGNOSIS — E875 Hyperkalemia: Secondary | ICD-10-CM

## 2023-06-03 DIAGNOSIS — E1169 Type 2 diabetes mellitus with other specified complication: Secondary | ICD-10-CM

## 2023-06-03 DIAGNOSIS — R300 Dysuria: Secondary | ICD-10-CM

## 2023-06-03 DIAGNOSIS — I2510 Atherosclerotic heart disease of native coronary artery without angina pectoris: Secondary | ICD-10-CM

## 2023-06-03 DIAGNOSIS — K50919 Crohn's disease, unspecified, with unspecified complications: Secondary | ICD-10-CM

## 2023-06-03 DIAGNOSIS — E1159 Type 2 diabetes mellitus with other circulatory complications: Secondary | ICD-10-CM

## 2023-06-03 LAB — CBC WITH DIFFERENTIAL/PLATELET
Basophils Relative: 0.8 %
Eosinophils Absolute: 248 cells/uL (ref 15–500)
Lymphs Abs: 1988 cells/uL (ref 850–3900)
MCH: 29.6 pg (ref 27.0–33.0)
MCHC: 33.3 g/dL (ref 32.0–36.0)
MPV: 9.8 fL (ref 7.5–12.5)
Neutro Abs: 4620 cells/uL (ref 1500–7800)
RDW: 12.3 % (ref 11.0–15.0)
WBC: 7.5 10*3/uL (ref 3.8–10.8)

## 2023-06-04 ENCOUNTER — Encounter: Payer: Self-pay | Admitting: Family Medicine

## 2023-06-04 LAB — COMPLETE METABOLIC PANEL WITH GFR
AG Ratio: 1.6 (calc) (ref 1.0–2.5)
ALT: 16 U/L (ref 6–29)
AST: 18 U/L (ref 10–35)
Albumin: 4.4 g/dL (ref 3.6–5.1)
Alkaline phosphatase (APISO): 83 U/L (ref 37–153)
BUN/Creatinine Ratio: 22 (calc) (ref 6–22)
BUN: 31 mg/dL — ABNORMAL HIGH (ref 7–25)
CO2: 25 mmol/L (ref 20–32)
Calcium: 10.1 mg/dL (ref 8.6–10.4)
Chloride: 106 mmol/L (ref 98–110)
Creat: 1.38 mg/dL — ABNORMAL HIGH (ref 0.50–1.05)
Globulin: 2.8 g/dL (calc) (ref 1.9–3.7)
Glucose, Bld: 145 mg/dL — ABNORMAL HIGH (ref 65–99)
Potassium: 5.3 mmol/L (ref 3.5–5.3)
Sodium: 139 mmol/L (ref 135–146)
Total Bilirubin: 0.4 mg/dL (ref 0.2–1.2)
Total Protein: 7.2 g/dL (ref 6.1–8.1)
eGFR: 42 mL/min/{1.73_m2} — ABNORMAL LOW (ref >=60)

## 2023-06-04 LAB — CBC WITH DIFFERENTIAL/PLATELET
Absolute Monocytes: 585 cells/uL (ref 200–950)
Basophils Absolute: 60 cells/uL (ref 0–200)
Eosinophils Relative: 3.3 %
HCT: 36.6 % (ref 35.0–45.0)
Hemoglobin: 12.2 g/dL (ref 11.7–15.5)
MCV: 88.8 fL (ref 80.0–100.0)
Monocytes Relative: 7.8 %
Neutrophils Relative %: 61.6 %
Platelets: 329 10*3/uL (ref 140–400)
RBC: 4.12 10*6/uL (ref 3.80–5.10)
Total Lymphocyte: 26.5 %

## 2023-06-04 LAB — HEMOGLOBIN A1C
Hgb A1c MFr Bld: 6.2 % of total Hgb — ABNORMAL HIGH (ref <5.7)
Mean Plasma Glucose: 131 mg/dL
eAG (mmol/L): 7.3 mmol/L

## 2023-06-04 LAB — URINALYSIS, ROUTINE W REFLEX MICROSCOPIC
Bilirubin Urine: NEGATIVE
Glucose, UA: NEGATIVE
Ketones, ur: NEGATIVE
Nitrite: POSITIVE — AB
Specific Gravity, Urine: 1.017 (ref 1.001–1.035)
WBC, UA: >=60 /HPF — AB (ref 0–5)
pH: 5.5 (ref 5.0–8.0)

## 2023-06-04 LAB — LIPID PANEL
Cholesterol: 111 mg/dL (ref <200)
HDL: 60 mg/dL (ref >=50)
LDL Cholesterol (Calc): 36 mg/dL (calc)
Non-HDL Cholesterol (Calc): 51 mg/dL (calc) (ref <130)
Total CHOL/HDL Ratio: 1.9 (calc) (ref <5.0)
Triglycerides: 69 mg/dL (ref <150)

## 2023-06-04 LAB — MICROSCOPIC MESSAGE

## 2023-06-05 ENCOUNTER — Other Ambulatory Visit: Payer: Self-pay | Admitting: Family Medicine

## 2023-06-05 MED ORDER — CEPHALEXIN 500 MG PO CAPS: 500.0000 mg | ORAL_CAPSULE | Freq: Three times a day (TID) | ORAL | 0 refills | Status: DC

## 2023-06-06 LAB — URINE CULTURE
MICRO NUMBER:: 15176438
SPECIMEN QUALITY:: ADEQUATE

## 2023-06-10 ENCOUNTER — Ambulatory Visit (INDEPENDENT_AMBULATORY_CARE_PROVIDER_SITE_OTHER): Payer: Medicare Other | Admitting: Family Medicine

## 2023-06-10 ENCOUNTER — Encounter: Payer: Self-pay | Admitting: Family Medicine

## 2023-06-10 VITALS — BP 122/74 | HR 85 | Temp 97.9°F | Ht 66.0 in | Wt 229.4 lb

## 2023-06-10 DIAGNOSIS — E7800 Pure hypercholesterolemia, unspecified: Secondary | ICD-10-CM | POA: Diagnosis not present

## 2023-06-10 DIAGNOSIS — I10 Essential (primary) hypertension: Secondary | ICD-10-CM

## 2023-06-10 DIAGNOSIS — E1169 Type 2 diabetes mellitus with other specified complication: Secondary | ICD-10-CM | POA: Diagnosis not present

## 2023-06-10 NOTE — Progress Notes (Signed)
Subjective:    Patient ID: Caitlyn [name], female    DOB: 02/01/1956, 67 y.o.   MRN: [id number]  Patient is a very pleasant 67 year old Caucasian female with a history of diabetes currently managed with metformin.  Her most recent hemoglobin A1c was 6.2.  She does occasionally get some neuropathy/neuropathic pain in her feet but she denies any numbness.  Diabetic foot exam was performed today and was normal.  She denies any chest pain, shortness of breath, dyspnea on exertion.  She does have a GFR in the 40s.  She is not presently on Farxiga.  She is also not on an ACE inhibitor/ARB.  Her blood pressure today is well-controlled at 122/74 Past Medical History:  Diagnosis Date   Asthma    Cold and cough --- no fever   Complication of anesthesia    24 yrs ago post tubal-- pulm. edema  (no problems post bowel surg. 2009)   Depression    Diabetes mellitus    oral med   Diabetes mellitus without complication (HCC)    Phreesia 11/28/2020   Diverticulitis hx  -- w/ perf. bowel   s/p resection 2009   Hypertension    Psoriasis    Reflux occasional   watches diet    Past Surgical History:  Procedure Laterality Date   BOWEL RESECTION  2009   diverticulitis w/ perf. bowel   COLON SURGERY N/A    Phreesia 11/28/2020   HYSTEROSCOPY  10/09/2011   Procedure: HYSTEROSCOPY;  Surgeon: Meriel Pica;  Location: [facility];  Service: Gynecology;;  HYSTEROSCOPY D & C   TUBAL LIGATION  24 yrs ago    Current Outpatient Medications on File Prior to Visit  Medication Sig Dispense Refill   albuterol (VENTOLIN HFA) 108 (90 Base) MCG/ACT inhaler Inhale 2 puffs into the lungs every 4 (four) hours as needed for wheezing or shortness of breath. 8 g 2   amLODipine (NORVASC) 10 MG tablet TAKE 1 TABLET(10 MG) BY MOUTH DAILY 90 tablet 0   aspirin EC 81 MG tablet Take 1 tablet (81 mg total) by mouth daily. Swallow whole. 30 tablet 11   cephALEXin (KEFLEX) 500 MG capsule Take 1 capsule (500 mg  total) by mouth 3 (three) times daily. 15 capsule 0   COSENTYX SENSOREADY 300 DOSE 150 MG/ML SOAJ Inject 150 mg into the muscle every 30 (thirty) days.     fexofenadine (ALLEGRA) 180 MG tablet Take 180 mg by mouth at bedtime.     Glucose Blood (BLOOD GLUCOSE TEST STRIPS) STRP Use as directed to monitor FSBS 4x daily. Dx: E11.65. 100 strip 1   hydrochlorothiazide (HYDRODIURIL) 25 MG tablet Take 1 tablet (25 mg total) by mouth daily. 90 tablet 1   metFORMIN (GLUCOPHAGE) 1000 MG tablet TAKE 1 TABLET(1000 MG) BY MOUTH TWICE DAILY WITH A MEAL 180 tablet 0   montelukast (SINGULAIR) 10 MG tablet TAKE 1 TABLET(10 MG) BY MOUTH AT BEDTIME 90 tablet 0   rosuvastatin (CRESTOR) 20 MG tablet TAKE 1 TABLET(20 MG) BY MOUTH DAILY 90 tablet 3   venlafaxine XR (EFFEXOR-XR) 150 MG 24 hr capsule TAKE 1 CAPSULE BY MOUTH EVERY DAY 60 capsule 0   No current facility-administered medications on file prior to visit.      Allergies  Allergen Reactions   Erythromycin Other (See Comments)    Gi upset/ cramps Other reaction(s): Unknown   Hydrochlorothiazide     CAUSED GOUT  Advised to drink plenty of fluids to avoid crystallization in  joints.    Pollen Extract    Macrolides And Ketolides Other (See Comments)    unknown   Social History   Socioeconomic History   Marital status: Married    Spouse name: Not on file   Number of children: Not on file   Years of education: Not on file   Highest education level: Not on file  Occupational History   Occupation: accountant  Tobacco Use   Smoking status: Never   Smokeless tobacco: Never  Vaping Use   Vaping status: Never Used  Substance and Sexual Activity   Alcohol use: No   Drug use: No   Sexual activity: Not on file  Other Topics Concern   Not on file  Social History Narrative   Lives with her husband   1-2 cups daily of Caffeine    Social Determinants of Health   Financial Resource Strain: Not on file  Food Insecurity: Not on file  Transportation  Needs: Not on file  Physical Activity: Not on file  Stress: Not on file  Social Connections: Not on file  Intimate Partner Violence: Not on file      Review of Systems     Objective:   Physical Exam Vitals reviewed.  Constitutional:      General: She is not in acute distress.    Appearance: Normal appearance. She is obese. She is not ill-appearing or toxic-appearing.  Cardiovascular:     Rate and Rhythm: Normal rate and regular rhythm.     Heart sounds: Normal heart sounds. No murmur heard.    No friction rub. No gallop.  Pulmonary:     Effort: Pulmonary effort is normal. No respiratory distress.     Breath sounds: No stridor. No wheezing or rales.  Abdominal:     General: Bowel sounds are normal. There is no distension.     Palpations: Abdomen is soft.     Tenderness: There is no abdominal tenderness. There is no guarding or rebound.  Musculoskeletal:     Left wrist: No crepitus.  Neurological:     Mental Status: She is alert.        Assessment & Plan:  Type 2 diabetes mellitus with other specified complication, unspecified whether long term insulin use (HCC) - Plan: Protein / Creatinine Ratio, Urine  Benign essential HTN  Pure hypercholesterolemia Patient appears to have stage IIIa borderline 3B chronic kidney disease.  She will benefit from an ACE inhibitor.  Obtain baseline urine protein to creatinine ratio.  Likely discontinue hydrochlorothiazide which initiate some supply.  All of the protein level is significantly elevated consider switching metformin to [place].  Blood pressure is excellent today.  LDL cholesterol is excellent.

## 2023-06-11 LAB — PROTEIN / CREATININE RATIO, URINE
Creatinine, Urine: 94 mg/dL (ref 20–275)
Protein/Creat Ratio: 830 mg/g creat — ABNORMAL HIGH (ref 24–184)
Protein/Creatinine Ratio: 0.83 mg/mg creat — ABNORMAL HIGH (ref 0.024–0.184)
Total Protein, Urine: 78 mg/dL — ABNORMAL HIGH (ref 5–24)

## 2023-06-12 ENCOUNTER — Encounter: Payer: Self-pay | Admitting: Family Medicine

## 2023-06-13 ENCOUNTER — Other Ambulatory Visit: Payer: Self-pay | Admitting: Family Medicine

## 2023-06-13 ENCOUNTER — Other Ambulatory Visit: Payer: Self-pay

## 2023-06-13 MED ORDER — VALSARTAN 320 MG PO TABS: 320.0000 mg | ORAL_TABLET | Freq: Every day | ORAL | 3 refills | Status: DC

## 2023-06-23 ENCOUNTER — Telehealth: Payer: Self-pay | Admitting: Family Medicine

## 2023-06-23 NOTE — Telephone Encounter (Signed)
Patient's husband called to report patient woke up not feeling well this morning. Sx:  fever, very congested; body aches.  Patient will take an at-home COVID test and call back with results.

## 2023-06-26 ENCOUNTER — Ambulatory Visit (INDEPENDENT_AMBULATORY_CARE_PROVIDER_SITE_OTHER): Payer: Medicare Other | Admitting: Family Medicine

## 2023-06-26 ENCOUNTER — Encounter: Payer: Self-pay | Admitting: Family Medicine

## 2023-06-26 VITALS — BP 115/72 | HR 99 | Temp 97.6°F | Ht 66.0 in | Wt 218.0 lb

## 2023-06-26 DIAGNOSIS — B9689 Other specified bacterial agents as the cause of diseases classified elsewhere: Secondary | ICD-10-CM

## 2023-06-26 DIAGNOSIS — J019 Acute sinusitis, unspecified: Secondary | ICD-10-CM

## 2023-06-26 MED ORDER — AMOXICILLIN-POT CLAVULANATE 875-125 MG PO TABS: 1.0000 | ORAL_TABLET | Freq: Two times a day (BID) | ORAL | 0 refills | Status: AC

## 2023-06-26 NOTE — Assessment & Plan Note (Signed)
Ongoing bacterial sinusitis on day 3 of her course of antibiotics with only mild improvement in symptoms. Encouraged to continue course of antibiotics, will add 2 days for full 7d course. Encouraged to try Tylenol 1000mg  q6h for pain, continue phenergan DM for cough, and use chloraseptic for sore throat. Return to office if symptoms persist or worsen and continue full course of antibiotics.

## 2023-06-26 NOTE — Progress Notes (Signed)
Subjective:  HPI: Caitlyn [name] is a 67 y.o. female presenting on 06/26/2023 for Well Child (sore throat not improving; seen at [facility] 06/23/23 - JBG\\\/Per pt when she stands up sometimes she feels like she is going to pass out/been happening at church as well. She had this episode this morning when getting pt roomed)   HPI Patient is in today for sore throat for 1.5 weeks associated with postnasal drip, right sided facial and ear pain, sinus congestion, subjective fever and body aches, cough She was seen in UC on Monday and diagnosed with sinus infection and started on Augmentin and she has been taking that since with no improvement in symptoms. Her fevers have eased up. She has tried Phenergan DM and Advil.    Review of Systems  All other systems reviewed and are negative.   Relevant past medical history reviewed and updated as indicated.   Past Medical History:  Diagnosis Date   Asthma    Complication of anesthesia    24 yrs ago post tubal-- pulm. edema  (no problems post bowel surg. 2009)   Depression    Diabetes mellitus    oral med   Diverticulitis hx  -- w/ perf. bowel   s/p resection 2009   Hypertension    Psoriasis    Reflux occasional   watches diet     Past Surgical History:  Procedure Laterality Date   BOWEL RESECTION  2009   diverticulitis w/ perf. bowel   COLON SURGERY N/A    Phreesia 11/28/2020   HYSTEROSCOPY  10/09/2011   Procedure: HYSTEROSCOPY;  Surgeon: Meriel Pica;  Location:  SUR[facility]ERY [facility];  Service: Gynecology;;  HYSTEROSCOPY D & C   TUBAL LIGATION  24 yrs ago    Allergies and medications reviewed and updated.   Current Outpatient Medications:    albuterol (VENTOLIN HFA) 108 (90 Base) MCG/ACT inhaler, Inhale 2 puffs into the lungs every 4 (four) hours as needed for wheezing or shortness of breath., Disp: 8 g, Rfl: 2   amLODipine (NORVASC) 10 MG tablet, TAKE 1 TABLET(10 MG) BY MOUTH DAILY, Disp: 90 tablet, Rfl: 0    amoxicillin-clavulanate (AUGMENTIN) 875-125 MG tablet, Take 1 tablet by mouth 2 (two) times daily for 2 days., Disp: 4 tablet, Rfl: 0   aspirin EC 81 MG tablet, Take 1 tablet (81 mg total) by mouth daily. Swallow whole., Disp: 30 tablet, Rfl: 11   COSENTYX SENSOREADY 300 DOSE 150 MG/ML SOAJ, Inject 150 mg into the muscle every 30 (thirty) days., Disp: , Rfl:    fexofenadine (ALLEGRA) 180 MG tablet, Take 180 mg by mouth at bedtime., Disp: , Rfl:    Glucose Blood (BLOOD GLUCOSE TEST STRIPS) STRP, Use as directed to monitor FSBS 4x daily. Dx: E11.65., Disp: 100 strip, Rfl: 1   hydrochlorothiazide (HYDRODIURIL) 25 MG tablet, Take 1 tablet (25 mg total) by mouth daily., Disp: 90 tablet, Rfl: 1   metFORMIN (GLUCOPHAGE) 1000 MG tablet, TAKE 1 TABLET(1000 MG) BY MOUTH TWICE DAILY WITH A MEAL, Disp: 180 tablet, Rfl: 0   montelukast (SINGULAIR) 10 MG tablet, TAKE 1 TABLET(10 MG) BY MOUTH AT BEDTIME, Disp: 90 tablet, Rfl: 0   rosuvastatin (CRESTOR) 20 MG tablet, TAKE 1 TABLET(20 MG) BY MOUTH DAILY, Disp: 90 tablet, Rfl: 3   valsartan (DIOVAN) 320 MG tablet, Take 1 tablet (320 mg total) by mouth daily., Disp: 90 tablet, Rfl: 3   venlafaxine XR (EFFEXOR-XR) 150 MG 24 hr capsule, TAKE 1 CAPSULE BY MOUTH  EVERY DAY, Disp: 60 capsule, Rfl: 3  Allergies  Allergen Reactions   Erythromycin Other (See Comments)    Gi upset/ cramps Other reaction(s): Unknown   Hydrochlorothiazide     CAUSED GOUT  Advised to drink plenty of fluids to avoid crystallization in joints.    Pollen Extract    Macrolides And Ketolides Other (See Comments)    unknown    Objective:   BP 115/72   Pulse 99   Temp 97.6 F (36.4 C) (Oral)   Ht 5\' 6"  (1.676 m)   Wt 218 lb (98.9 kg)   SpO2 99%   BMI 35.19 kg/m      06/26/2023    8:21 AM 06/10/2023    7:59 AM 02/17/2023   10:15 AM  Vitals with BMI  Height 5\' 6"  5\' 6"  5\' 6"   Weight 218 lbs 229 lbs 6 oz 231 lbs  BMI 35.2 37.04 37.3  Systolic 115 122 696  Diastolic 72 74 86  Pulse  99 85 91     Physical Exam Vitals and nursing note reviewed.  Constitutional:      Appearance: Normal appearance. She is normal weight.  HENT:     Head: Normocephalic and atraumatic.     Right Ear: Tympanic membrane, ear canal and external ear normal.     Left Ear: Tympanic membrane, ear canal and external ear normal.     Nose:     Right Sinus: Maxillary sinus tenderness present.     Mouth/Throat:     Mouth: Mucous membranes are moist.     Pharynx: Oropharynx is clear.     Comments: Hoarse voice Pulmonary:     Effort: Pulmonary effort is normal.     Breath sounds: Normal breath sounds.  Skin:    General: Skin is warm and dry.  Neurological:     General: No focal deficit present.     Mental Status: She is alert and oriented to person, place, and time. Mental status is at baseline.  Psychiatric:        Mood and Affect: Mood normal.        Behavior: Behavior normal.        Thought Content: Thought content normal.        Judgment: Judgment normal.     Assessment & Plan:  Acute bacterial sinusitis Assessment & Plan: Ongoing bacterial sinusitis on day 3 of her course of antibiotics with only mild improvement in symptoms. Encouraged to continue course of antibiotics, will add 2 days for full 7d course. Encouraged to try Tylenol 1000mg  q6h for pain, continue phenergan DM for cough, and use chloraseptic for sore throat. Return to office if symptoms persist or worsen and continue full course of antibiotics.   Other orders -     Amoxicillin-Pot Clavulanate; Take 1 tablet by mouth 2 (two) times daily for 2 days.  Dispense: 4 tablet; Refill: 0     Follow up plan: Return if symptoms worsen or fail to improve.  Park Meo, FNP

## 2023-06-29 ENCOUNTER — Other Ambulatory Visit: Payer: Self-pay

## 2023-06-29 ENCOUNTER — Emergency Department (HOSPITAL_BASED_OUTPATIENT_CLINIC_OR_DEPARTMENT_OTHER): Payer: Medicare Other | Admitting: Radiology

## 2023-06-29 ENCOUNTER — Encounter (HOSPITAL_BASED_OUTPATIENT_CLINIC_OR_DEPARTMENT_OTHER): Payer: Self-pay | Admitting: Emergency Medicine

## 2023-06-29 ENCOUNTER — Emergency Department (HOSPITAL_BASED_OUTPATIENT_CLINIC_OR_DEPARTMENT_OTHER)
Admission: EM | Admit: 2023-06-29 | Discharge: 2023-06-29 | Disposition: A | Discharge status: Home / Self Care | Payer: Medicare Other | Attending: Emergency Medicine | Admitting: Emergency Medicine

## 2023-06-29 DIAGNOSIS — Z7984 Long term (current) use of oral hypoglycemic drugs: Secondary | ICD-10-CM | POA: Diagnosis not present

## 2023-06-29 DIAGNOSIS — R0789 Other chest pain: Secondary | ICD-10-CM | POA: Diagnosis present

## 2023-06-29 DIAGNOSIS — E119 Type 2 diabetes mellitus without complications: Secondary | ICD-10-CM | POA: Diagnosis not present

## 2023-06-29 DIAGNOSIS — I10 Essential (primary) hypertension: Secondary | ICD-10-CM | POA: Diagnosis not present

## 2023-06-29 DIAGNOSIS — U071 COVID-19: Secondary | ICD-10-CM | POA: Insufficient documentation

## 2023-06-29 DIAGNOSIS — Z7982 Long term (current) use of aspirin: Secondary | ICD-10-CM | POA: Diagnosis not present

## 2023-06-29 DIAGNOSIS — Z79899 Other long term (current) drug therapy: Secondary | ICD-10-CM | POA: Diagnosis not present

## 2023-06-29 LAB — CBC
HCT: 33.9 % — ABNORMAL LOW (ref 36.0–46.0)
Hemoglobin: 11.5 g/dL — ABNORMAL LOW (ref 12.0–15.0)
MCH: 29.6 pg (ref 26.0–34.0)
MCHC: 33.9 g/dL (ref 30.0–36.0)
MCV: 87.4 fL (ref 80.0–100.0)
Platelets: 262 10*3/uL (ref 150–400)
RBC: 3.88 MIL/uL (ref 3.87–5.11)
RDW: 12 % (ref 11.5–15.5)
WBC: 9.4 10*3/uL (ref 4.0–10.5)
nRBC: 0 % (ref 0.0–0.2)

## 2023-06-29 LAB — BASIC METABOLIC PANEL
Anion gap: 12 (ref 5–15)
BUN: 29 mg/dL — ABNORMAL HIGH (ref 8–23)
CO2: 21 mmol/L — ABNORMAL LOW (ref 22–32)
Calcium: 9.6 mg/dL (ref 8.9–10.3)
Chloride: 104 mmol/L (ref 98–111)
Creatinine, Ser: 1.46 mg/dL — ABNORMAL HIGH (ref 0.44–1.00)
GFR, Estimated: 39 mL/min — ABNORMAL LOW (ref >60)
Glucose, Bld: 194 mg/dL — ABNORMAL HIGH (ref 70–99)
Potassium: 4.8 mmol/L (ref 3.5–5.1)
Sodium: 137 mmol/L (ref 135–145)

## 2023-06-29 LAB — TROPONIN I (HIGH SENSITIVITY)
Troponin I (High Sensitivity): 4 ng/L (ref <18)
Troponin I (High Sensitivity): 4 ng/L (ref <18)

## 2023-06-29 LAB — D-DIMER, QUANTITATIVE: D-Dimer, Quant: 0.36 ug/mL-FEU (ref 0.00–0.50)

## 2023-06-29 LAB — SARS CORONAVIRUS 2 BY RT PCR: SARS Coronavirus 2 by RT PCR: POSITIVE — AB

## 2023-06-29 NOTE — ED Notes (Signed)
Dc instructions reviewed with patient. Patient voiced understanding. Dc with belongings.  °

## 2023-06-29 NOTE — Discharge Instructions (Signed)
Follow-up with your doctor.  COVID test was positive so explains a lot of the upper respiratory symptoms.  Workup for the chest pain negative for any acute cardiac event or blood clots in the lungs.  When you are feeling better make an appointment to follow-up with cardiology for additional workup for the chest pain.  Return for any new or worse symptoms.

## 2023-06-29 NOTE — ED Triage Notes (Signed)
Pt presents to ED POV. Pt c/o CP since yesterday. Pt reports pain is central and radiates towards back. No cardiac hx. Pt reports worsening with breathing. Reports recent URI x2w w/ cough.

## 2023-06-29 NOTE — ED Provider Notes (Addendum)
[place] EMERGENCY DEPARTMENT AT [facility] Provider Note   CSN: [id number] Arrival date & time: 06/29/23  1016     History  Chief Complaint  Patient presents with   Chest Pain    Caitlyn [name] is a 67 y.o. female.  Patient here with a complaint of chest pain low substernal radiates to the back started last evening around 2200 it is waxed and waned never gone away since then.  Still present.  In addition patient's had an upper respiratory infection for a little over a week with cough and being treated as a sinus infection with Augmentin.  Patient has not had any COVID testing.  Patient reports that the chest discomfort is worse with breathing.  Temp here 97.8 blood pressure 130/52.  Patient's not on any blood thinners.  Past medical history significant for diabetes hypertension diverticulitis.  Past surgical history tubal ligation 24 years ago.  Bowel resection in 2009 diverticulitis with perforation.  Patient is never used tobacco products.       Home Medications Prior to Admission medications   Medication Sig Start Date End Date Taking? Authorizing Provider  albuterol (VENTOLIN HFA) 108 (90 Base) MCG/ACT inhaler Inhale 2 puffs into the lungs every 4 (four) hours as needed for wheezing or shortness of breath. 09/02/22   Park Meo, FNP  amLODipine (NORVASC) 10 MG tablet TAKE 1 TABLET(10 MG) BY MOUTH DAILY 05/16/23   Donita Brooks, MD  aspirin EC 81 MG tablet Take 1 tablet (81 mg total) by mouth daily. Swallow whole. 06/12/21   McCue, Shanda Bumps, NP  COSENTYX SENSOREADY 300 DOSE 150 MG/ML SOAJ Inject 150 mg into the muscle every 30 (thirty) days. 08/21/17   [provider]  fexofenadine (ALLEGRA) 180 MG tablet Take 180 mg by mouth at bedtime.    [provider]  Glucose Blood (BLOOD GLUCOSE TEST STRIPS) STRP Use as directed to monitor FSBS 4x daily. Dx: E11.65. 08/27/21   Donita Brooks, MD  hydrochlorothiazide (HYDRODIURIL) 25 MG tablet Take 1 tablet  (25 mg total) by mouth daily. 05/20/23   Donita Brooks, MD  metFORMIN (GLUCOPHAGE) 1000 MG tablet TAKE 1 TABLET(1000 MG) BY MOUTH TWICE DAILY WITH A MEAL 11/19/22   Donita Brooks, MD  montelukast (SINGULAIR) 10 MG tablet TAKE 1 TABLET(10 MG) BY MOUTH AT BEDTIME 05/16/23   Donita Brooks, MD  rosuvastatin (CRESTOR) 20 MG tablet TAKE 1 TABLET(20 MG) BY MOUTH DAILY 11/19/22   Donita Brooks, MD  valsartan (DIOVAN) 320 MG tablet Take 1 tablet (320 mg total) by mouth daily. 06/13/23   Donita Brooks, MD  venlafaxine XR (EFFEXOR-XR) 150 MG 24 hr capsule TAKE 1 CAPSULE BY MOUTH EVERY DAY 06/13/23   Donita Brooks, MD      Allergies    Erythromycin, Hydrochlorothiazide, Pollen extract, and Macrolides and ketolides    Review of Systems   Review of Systems  Constitutional:  Negative for chills and fever.  HENT:  Positive for congestion. Negative for ear pain and sore throat.   Eyes:  Negative for pain and visual disturbance.  Respiratory:  Positive for cough. Negative for shortness of breath.   Cardiovascular:  Positive for chest pain. Negative for palpitations.  Gastrointestinal:  Negative for abdominal pain and vomiting.  Genitourinary:  Negative for dysuria and hematuria.  Musculoskeletal:  Positive for back pain. Negative for arthralgias.  Skin:  Negative for color change and rash.  Neurological:  Negative for seizures and syncope.  All  other systems reviewed and are negative.   Physical Exam Updated Vital Signs BP (!) 130/52 (BP Location: Right Wrist)   Pulse 96   Temp 97.8 F (36.6 C) (Tympanic)   Resp 18   SpO2 99%  Physical Exam Vitals and nursing note reviewed.  Constitutional:      General: She is not in acute distress.    Appearance: Normal appearance. She is well-developed. She is not ill-appearing.  HENT:     Head: Normocephalic and atraumatic.     Mouth/Throat:     Mouth: Mucous membranes are moist.  Eyes:     Extraocular Movements: Extraocular  movements intact.     Conjunctiva/sclera: Conjunctivae normal.     Pupils: Pupils are equal, round, and reactive to light.  Cardiovascular:     Rate and Rhythm: Normal rate and regular rhythm.     Heart sounds: No murmur heard. Pulmonary:     Effort: Pulmonary effort is normal. No respiratory distress.     Breath sounds: Normal breath sounds. No wheezing, rhonchi or rales.  Abdominal:     Palpations: Abdomen is soft.     Tenderness: There is no abdominal tenderness.  Musculoskeletal:        General: No swelling.     Cervical back: Normal range of motion and neck supple.     Right lower leg: No edema.     Left lower leg: No edema.  Skin:    General: Skin is warm and dry.     Capillary Refill: Capillary refill takes less than 2 seconds.  Neurological:     General: No focal deficit present.     Mental Status: She is alert and oriented to person, place, and time.  Psychiatric:        Mood and Affect: Mood normal.     ED Results / Procedures / Treatments   Labs (all labs ordered are listed, but only abnormal results are displayed) Labs Reviewed  SARS CORONAVIRUS 2 BY RT PCR - Abnormal; Notable for the following components:      Result Value   SARS Coronavirus 2 by RT PCR POSITIVE (*)    All other components within normal limits  BASIC METABOLIC PANEL - Abnormal; Notable for the following components:   CO2 21 (*)    Glucose, Bld 194 (*)    BUN 29 (*)    Creatinine, Ser 1.46 (*)    GFR, Estimated 39 (*)    All other components within normal limits  CBC - Abnormal; Notable for the following components:   Hemoglobin 11.5 (*)    HCT 33.9 (*)    All other components within normal limits  D-DIMER, QUANTITATIVE  TROPONIN I (HIGH SENSITIVITY)  TROPONIN I (HIGH SENSITIVITY)    EKG EKG Interpretation Date/Time:  Sunday June 29 2023 10:27:55 EDT Ventricular Rate:  95 PR Interval:  144 QRS Duration:  82 QT Interval:  330 QTC Calculation: 414 R Axis:   13  Text  Interpretation: Normal sinus rhythm Cannot rule out Anterior infarct , age undetermined Abnormal ECG When compared with ECG of 01-Sep-2021 12:53, PREVIOUS ECG IS PRESENT No significant change since last tracing Confirmed by Vanetta Mulders [phone]) on 06/29/2023 10:45:33 AM  Radiology DG Chest 2 View  Result Date: 06/29/2023 CLINICAL DATA:  Central chest pain since yesterday. EXAM: CHEST - 2 VIEW COMPARISON:  Cardiac CT dated December 11, 2021. Chest x-ray dated August 21, 2015. FINDINGS: The heart size and mediastinal contours are within normal limits. Both lungs  are clear. The visualized skeletal structures are unremarkable. IMPRESSION: No active cardiopulmonary disease. Electronically Signed   By: Obie Dredge M.D.   On: 06/29/2023 11:00    Procedures Procedures    Medications Ordered in ED Medications - No data to display  ED Course/ Medical Decision Making/ A&P                                 Medical Decision Making Amount and/or Complexity of Data Reviewed Labs: ordered. Radiology: ordered.   Workup here COVID testing is positive.  Was suspicious for this with the upper respiratory infection for over a week.  Not getting better with any antibiotics so probably not a true sinus infection.  Basic metabolic panel GFR 39.  Creatinine 1.46.  Blood sugar 194 CO2 21 electrolytes normal.  CBC no leukocytosis.  Hemoglobin 11.5 and platelets 262 initial troponin 4.  But will need delta troponin.  Patient's D-dimer was normal at 0.36.  Very reassuring because of the pleuritic nature of the pain.  Chest x-ray without any active cardiopulmonary disease.  Awaiting delta troponin.  If negative patient is stable for discharge home.  Regarding the COVID patient is out of the window for Paxlovid.  Patient nontoxic no acute distress.  Second troponin very normal.  Patient stable for discharge home.  And follow-up with primary care doctor.  Will also give referral to cardiology for the atypical  chest pain.   Final Clinical Impression(s) / ED Diagnoses Final diagnoses:  Atypical chest pain  COVID    Rx / DC Orders ED Discharge Orders     None         Vanetta Mulders, MD 06/29/23 1235    Vanetta Mulders, MD 06/29/23 1353

## 2023-07-07 ENCOUNTER — Telehealth: Payer: Self-pay

## 2023-07-07 NOTE — Telephone Encounter (Signed)
Transition Care Management Unsuccessful Follow-up Telephone Call  Date of discharge and from where:  06/29/2023 [facility]  Attempts:  1st Attempt  Reason for unsuccessful TCM follow-up call:  Left voice message  Aeric Burnham [facility]  [facility] Population Health Community Resource Care Guide   [email or web address]  ?? [phone]   Website: [email or web address]  [email or web address]

## 2023-07-07 NOTE — Telephone Encounter (Signed)
Transition Care Management Follow-up Telephone Call Date of discharge and from where: 06/29/2023 [facility] How have you been since you were released from the hospital? Patient stated she is feeling much better. Any questions or concerns? No  Items Reviewed: Did the pt receive and understand the discharge instructions provided? Yes  Medications obtained and verified?  No medication prescribed. Other? No  Any new allergies since your discharge? No  Dietary orders reviewed? Yes Do you have support at home? Yes   Follow up appointments reviewed:  PCP Hospital f/u appt confirmed? Yes  Scheduled to see Broadus John T. Linton Flemings, MD on 08/07/2023 @ [facility]. Specialist Hospital f/u appt confirmed? No  Scheduled to see  on  @ . Are transportation arrangements needed? No  If their condition worsens, is the pt aware to call PCP or go to the Emergency Dept.? Yes Was the patient provided with contact information for the PCP's office or ED? Yes Was to pt encouraged to call back with questions or concerns? Yes  Eirene Rather [facility]  [facility] Population Health Community Resource Care Guide   [email or web address]  ?? [phone]   Website: [email or web address]  [email or web address]

## 2023-07-14 ENCOUNTER — Other Ambulatory Visit: Payer: Self-pay | Admitting: Family Medicine

## 2023-07-15 NOTE — Telephone Encounter (Signed)
Requested Prescriptions  Pending Prescriptions Disp Refills   metFORMIN (GLUCOPHAGE) 1000 MG tablet [Pharmacy Med Name: METFORMIN 1000MG  TABLETS] 180 tablet 0    Sig: TAKE 1 TABLET(1000 MG) BY MOUTH TWICE DAILY WITH A MEAL     Endocrinology:  Diabetes - Biguanides Failed - 07/14/2023  9:20 AM      Failed - Cr in normal range and within 360 days    Creat  Date Value Ref Range Status  06/03/2023 1.38 (H) 0.50 - 1.05 mg/dL Final   Creatinine, Ser  Date Value Ref Range Status  06/29/2023 1.46 (H) 0.44 - 1.00 mg/dL Final   Creatinine, Urine  Date Value Ref Range Status  06/10/2023 94 20 - 275 mg/dL Final         Failed - eGFR in normal range and within 360 days    GFR, Est African American  Date Value Ref Range Status  01/01/2019 76 > OR = 60 mL/min/1.12m2 Final   GFR, Est Non African American  Date Value Ref Range Status  01/01/2019 66 > OR = 60 mL/min/1.50m2 Final   GFR, Estimated  Date Value Ref Range Status  06/29/2023 39 (L) >60 mL/min Final    Comment:    (NOTE) Calculated using the CKD-EPI Creatinine Equation (2021)    eGFR  Date Value Ref Range Status  06/03/2023 42 (L) > OR = 60 mL/min/1.49m2 Final  11/30/2021 52 (L) >59 mL/min/1.73 Final         Failed - B12 Level in normal range and within 720 days    No results found for: "VITAMINB12"       Failed - Valid encounter within last 6 months    Recent Outpatient Visits           1 year ago Dysuria   [facility] Valentino Nose, NP   1 year ago Upper respiratory tract infection, unspecified type   [facility] Valentino Nose, NP   1 year ago Type 2 diabetes mellitus with other specified complication, unspecified whether long term insulin use (HCC)   [facility] Donita Brooks, MD   1 year ago Type 2 diabetes mellitus with other specified complication, unspecified whether long term insulin use (HCC)   [facility] Donita Brooks,  MD   2 years ago Ganglion cyst of dorsum of left wrist   [facility] Pickard, Priscille Heidelberg, MD              Passed - HBA1C is between 0 and 7.9 and within 180 days    Hgb A1c MFr Bld  Date Value Ref Range Status  06/03/2023 6.2 (H) <5.7 % of total Hgb Final    Comment:    For someone without known diabetes, a hemoglobin  A1c value between 5.7% and 6.4% is consistent with prediabetes and should be confirmed with a  follow-up test. . For someone with known diabetes, a value <7% indicates that their diabetes is well controlled. A1c targets should be individualized based on duration of diabetes, age, comorbid conditions, and other considerations. . This assay result is consistent with an increased risk of diabetes. . Currently, no consensus exists regarding use of hemoglobin A1c for diagnosis of diabetes for children. .          Passed - CBC within normal limits and completed in the last 12 months    WBC  Date Value Ref Range Status  06/29/2023 9.4 4.0 -  10.5 K/uL Final   RBC  Date Value Ref Range Status  06/29/2023 3.88 3.87 - 5.11 MIL/uL Final   Hemoglobin  Date Value Ref Range Status  06/29/2023 11.5 (L) 12.0 - 15.0 g/dL Final   HCT  Date Value Ref Range Status  06/29/2023 33.9 (L) 36.0 - 46.0 % Final   MCHC  Date Value Ref Range Status  06/29/2023 33.9 30.0 - 36.0 g/dL Final   [facility]  Date Value Ref Range Status  06/29/2023 29.6 26.0 - 34.0 pg Final   MCV  Date Value Ref Range Status  06/29/2023 87.4 80.0 - 100.0 fL Final   No results found for: "PLTCOUNTKUC", "LABPLAT", "POCPLA" RDW  Date Value Ref Range Status  06/29/2023 12.0 11.5 - 15.5 % Final

## 2023-07-16 LAB — LAB REPORT - SCANNED: EGFR: 36

## 2023-08-07 ENCOUNTER — Ambulatory Visit (INDEPENDENT_AMBULATORY_CARE_PROVIDER_SITE_OTHER): Payer: Medicare Other | Admitting: Family Medicine

## 2023-08-07 VITALS — BP 126/80 | HR 94 | Temp 97.7°F | Ht 66.0 in | Wt 227.2 lb

## 2023-08-07 DIAGNOSIS — Z23 Encounter for immunization: Secondary | ICD-10-CM | POA: Diagnosis not present

## 2023-08-07 DIAGNOSIS — R300 Dysuria: Secondary | ICD-10-CM

## 2023-08-07 DIAGNOSIS — E7800 Pure hypercholesterolemia, unspecified: Secondary | ICD-10-CM

## 2023-08-07 DIAGNOSIS — E1169 Type 2 diabetes mellitus with other specified complication: Secondary | ICD-10-CM

## 2023-08-07 DIAGNOSIS — Z7984 Long term (current) use of oral hypoglycemic drugs: Secondary | ICD-10-CM

## 2023-08-07 DIAGNOSIS — N1832 Chronic kidney disease, stage 3b: Secondary | ICD-10-CM

## 2023-08-07 DIAGNOSIS — Z0000 Encounter for general adult medical examination without abnormal findings: Secondary | ICD-10-CM

## 2023-08-07 DIAGNOSIS — I10 Essential (primary) hypertension: Secondary | ICD-10-CM

## 2023-08-07 LAB — URINALYSIS, ROUTINE W REFLEX MICROSCOPIC
Bilirubin Urine: NEGATIVE
Glucose, UA: NEGATIVE
Hyaline Cast: NONE SEEN /LPF
Nitrite: NEGATIVE
Specific Gravity, Urine: 1.021 (ref 1.001–1.035)
pH: 5.5 (ref 5.0–8.0)

## 2023-08-07 LAB — MICROSCOPIC MESSAGE

## 2023-08-07 MED ORDER — CEPHALEXIN 500 MG PO CAPS: 500.0000 mg | ORAL_CAPSULE | Freq: Three times a day (TID) | ORAL | 0 refills | Status: DC

## 2023-08-07 NOTE — Addendum Note (Signed)
Addended by: Elizbeth Squires on: 08/07/2023 10:06 AM   Modules accepted: Orders

## 2023-08-07 NOTE — Progress Notes (Signed)
Subjective:    Patient ID: Caitlyn [name], female    DOB: 1956-07-13, 67 y.o.   MRN: [id number]  Patient is a very pleasant 67 year old Caucasian female with a history of diabetes mellitus 2 here for CPE.  Last colonoscopy was 2019 and is due again in 2029.  Last mammogram was 8/23.  Patient is scheduled her Pap smear.  She is scheduled for mammogram.  These are pending.  She has had the pneumonia vaccine Pneumovax 23 and the shingles vaccine at her local pharmacy.  She is due for flu shot.  She declines a COVID shot.  She has stage IIIb chronic kidney disease.  We discussed this at length.  I would like to discontinue metformin and replace with [place].  The patient is interested in Ozempic. Past Medical History:  Diagnosis Date   Asthma    Complication of anesthesia    24 yrs ago post tubal-- pulm. edema  (no problems post bowel surg. 2009)   Depression    Diabetes mellitus    oral med   Diverticulitis hx  -- w/ perf. bowel   s/p resection 2009   Hypertension    Psoriasis    Reflux occasional   watches diet    Past Surgical History:  Procedure Laterality Date   BOWEL RESECTION  2009   diverticulitis w/ perf. bowel   COLON SURGERY N/A    [employer] 11/28/2020   HYSTEROSCOPY  10/09/2011   Procedure: HYSTEROSCOPY;  Surgeon: Meriel Pica;  Location: [facility];  Service: Gynecology;;  HYSTEROSCOPY D & C   TUBAL LIGATION  24 yrs ago    Current Outpatient Medications on File Prior to Visit  Medication Sig Dispense Refill   albuterol (VENTOLIN HFA) 108 (90 Base) MCG/ACT inhaler Inhale 2 puffs into the lungs every 4 (four) hours as needed for wheezing or shortness of breath. 8 g 2   amLODipine (NORVASC) 10 MG tablet TAKE 1 TABLET(10 MG) BY MOUTH DAILY 90 tablet 0   aspirin EC 81 MG tablet Take 1 tablet (81 mg total) by mouth daily. Swallow whole. 30 tablet 11   COSENTYX SENSOREADY 300 DOSE 150 MG/ML SOAJ Inject 150 mg into the muscle every 30 (thirty) days.      fexofenadine (ALLEGRA) 180 MG tablet Take 180 mg by mouth at bedtime.     Glucose Blood (BLOOD GLUCOSE TEST STRIPS) STRP Use as directed to monitor FSBS 4x daily. Dx: E11.65. 100 strip 1   hydrochlorothiazide (HYDRODIURIL) 25 MG tablet Take 1 tablet (25 mg total) by mouth daily. 90 tablet 1   metFORMIN (GLUCOPHAGE) 1000 MG tablet TAKE 1 TABLET(1000 MG) BY MOUTH TWICE DAILY WITH A MEAL 180 tablet 0   montelukast (SINGULAIR) 10 MG tablet TAKE 1 TABLET(10 MG) BY MOUTH AT BEDTIME 90 tablet 0   rosuvastatin (CRESTOR) 20 MG tablet TAKE 1 TABLET(20 MG) BY MOUTH DAILY 90 tablet 3   valsartan (DIOVAN) 320 MG tablet Take 1 tablet (320 mg total) by mouth daily. 90 tablet 3   venlafaxine XR (EFFEXOR-XR) 150 MG 24 hr capsule TAKE 1 CAPSULE BY MOUTH EVERY DAY 60 capsule 3   No current facility-administered medications on file prior to visit.      Allergies  Allergen Reactions   Erythromycin Other (See Comments)    Gi upset/ cramps Other reaction(s): Unknown   Hydrochlorothiazide     CAUSED GOUT  Advised to drink plenty of fluids to avoid crystallization in joints.    Pollen Extract  Macrolides And Ketolides Other (See Comments)    unknown   Social History   Socioeconomic History   Marital status: Married    Spouse name: Not on file   Number of children: Not on file   Years of education: Not on file   Highest education level: Not on file  Occupational History   Occupation: accountant  Tobacco Use   Smoking status: Never   Smokeless tobacco: Never  Vaping Use   Vaping status: Never Used  Substance and Sexual Activity   Alcohol use: No   Drug use: No   Sexual activity: Not on file  Other Topics Concern   Not on file  Social History Narrative   Lives with her husband   1-2 cups daily of Caffeine    Social Determinants of Health   Financial Resource Strain: Not on file  Food Insecurity: Not on file  Transportation Needs: Not on file  Physical Activity: Not on file  Stress:  Not on file  Social Connections: Not on file  Intimate Partner Violence: Not on file      Review of Systems     Objective:   Physical Exam Vitals reviewed.  Constitutional:      General: She is not in acute distress.    Appearance: Normal appearance. She is obese. She is not ill-appearing or toxic-appearing.  Cardiovascular:     Rate and Rhythm: Normal rate and regular rhythm.     Heart sounds: Normal heart sounds. No murmur heard.    No friction rub. No gallop.  Pulmonary:     Effort: Pulmonary effort is normal. No respiratory distress.     Breath sounds: No stridor. No wheezing or rales.  Abdominal:     General: Bowel sounds are normal. There is no distension.     Palpations: Abdomen is soft.     Tenderness: There is no abdominal tenderness. There is no guarding or rebound.  Musculoskeletal:     Left wrist: No crepitus.  Neurological:     Mental Status: She is alert.        Assessment & Plan:  Type 2 diabetes mellitus with other specified complication, unspecified whether long term insulin use (HCC) - Plan: Hemoglobin A1c, CBC with Differential/Platelet, COMPLETE METABOLIC PANEL WITH GFR, Lipid panel  Benign essential HTN - Plan: Hemoglobin A1c, CBC with Differential/Platelet, COMPLETE METABOLIC PANEL WITH GFR, Lipid panel  Pure hypercholesterolemia - Plan: Hemoglobin A1c, CBC with Differential/Platelet, COMPLETE METABOLIC PANEL WITH GFR, Lipid panel  General medical exam - Plan: Hemoglobin A1c, CBC with Differential/Platelet, COMPLETE METABOLIC PANEL WITH GFR, Lipid panel  Stage 3b chronic kidney disease (HCC) - Plan: Hemoglobin A1c, CBC with Differential/Platelet, COMPLETE METABOLIC PANEL WITH GFR, Lipid panel  Dysuria - Plan: Urinalysis, Routine w reflex microscopic Blood pressure today is good.  I will check a CBC a CMP and a lipid panel and A1c.  Goal A1c is less than 6.5.  Check a urine protein to creatinine ratio.  If elevated I will replace her metformin with  [place].  If normal we will replace her metformin with Ozempic.  Recommended calcium and vitamin D.  Patient is scheduled for mammogram and Pap smear oncologist.  Shingles and pneumonia vaccines are up-to-date.  She is seeing an eye doctor.  Diabetic foot exam was normal.  She received her flu shot today

## 2023-08-08 ENCOUNTER — Other Ambulatory Visit: Payer: Self-pay

## 2023-08-08 LAB — CBC WITH DIFFERENTIAL/PLATELET
Absolute Monocytes: 660 {cells}/uL (ref 200–950)
Basophils Absolute: 53 {cells}/uL (ref 0–200)
Basophils Relative: 0.6 %
Eosinophils Absolute: 238 {cells}/uL (ref 15–500)
Eosinophils Relative: 2.7 %
HCT: 36.2 % (ref 35.0–45.0)
Hemoglobin: 12 g/dL (ref 11.7–15.5)
Lymphs Abs: 1751 {cells}/uL (ref 850–3900)
MCH: 29.1 pg (ref 27.0–33.0)
MCHC: 33.1 g/dL (ref 32.0–36.0)
MCV: 87.9 fL (ref 80.0–100.0)
MPV: 10.3 fL (ref 7.5–12.5)
Monocytes Relative: 7.5 %
Neutro Abs: 6098 {cells}/uL (ref 1500–7800)
Neutrophils Relative %: 69.3 %
Platelets: 370 10*3/uL (ref 140–400)
RBC: 4.12 10*6/uL (ref 3.80–5.10)
RDW: 12.9 % (ref 11.0–15.0)
Total Lymphocyte: 19.9 %
WBC: 8.8 10*3/uL (ref 3.8–10.8)

## 2023-08-08 LAB — COMPLETE METABOLIC PANEL WITH GFR
AG Ratio: 1.5 (calc) (ref 1.0–2.5)
ALT: 16 U/L (ref 6–29)
AST: 18 U/L (ref 10–35)
Albumin: 4.4 g/dL (ref 3.6–5.1)
Alkaline phosphatase (APISO): 81 U/L (ref 37–153)
BUN/Creatinine Ratio: 21 (calc) (ref 6–22)
BUN: 30 mg/dL — ABNORMAL HIGH (ref 7–25)
CO2: 22 mmol/L (ref 20–32)
Calcium: 10.2 mg/dL (ref 8.6–10.4)
Chloride: 107 mmol/L (ref 98–110)
Creat: 1.42 mg/dL — ABNORMAL HIGH (ref 0.50–1.05)
Globulin: 2.9 g/dL (ref 1.9–3.7)
Glucose, Bld: 135 mg/dL — ABNORMAL HIGH (ref 65–99)
Potassium: 5.3 mmol/L (ref 3.5–5.3)
Sodium: 139 mmol/L (ref 135–146)
Total Bilirubin: 0.4 mg/dL (ref 0.2–1.2)
Total Protein: 7.3 g/dL (ref 6.1–8.1)
eGFR: 41 mL/min/{1.73_m2} — ABNORMAL LOW (ref >=60)

## 2023-08-08 LAB — LIPID PANEL
Cholesterol: 108 mg/dL (ref <200)
HDL: 60 mg/dL (ref >=50)
LDL Cholesterol (Calc): 33 mg/dL
Non-HDL Cholesterol (Calc): 48 mg/dL (ref <130)
Total CHOL/HDL Ratio: 1.8 (calc) (ref <5.0)
Triglycerides: 71 mg/dL (ref <150)

## 2023-08-08 LAB — HEMOGLOBIN A1C
Hgb A1c MFr Bld: 6.1 %{Hb} — ABNORMAL HIGH (ref <5.7)
Mean Plasma Glucose: 128 mg/dL
eAG (mmol/L): 7.1 mmol/L

## 2023-08-08 MED ORDER — DAPAGLIFLOZIN PROPANEDIOL 10 MG PO TABS: 10.0000 mg | ORAL_TABLET | Freq: Every day | ORAL | 0 refills | Status: DC

## 2023-08-10 ENCOUNTER — Other Ambulatory Visit: Payer: Self-pay | Admitting: Family Medicine

## 2023-08-11 ENCOUNTER — Other Ambulatory Visit: Payer: Self-pay | Admitting: Family Medicine

## 2023-08-11 ENCOUNTER — Encounter: Payer: Self-pay | Admitting: Family Medicine

## 2023-08-11 MED ORDER — SULFAMETHOXAZOLE-TRIMETHOPRIM 800-160 MG PO TABS: 1.0000 | ORAL_TABLET | Freq: Two times a day (BID) | ORAL | 0 refills | Status: DC

## 2023-08-11 NOTE — Addendum Note (Signed)
Addended by: Lynnea Ferrier T on: 08/11/2023 06:37 AM   Modules accepted: Level of Service

## 2023-08-11 NOTE — Telephone Encounter (Addendum)
Pharmacy sent script to request drug change for dapagliflozin propanediol (FARXIGA) 10 MG TABS tablet   Pharmacist also requesting refills of   amLODipine (NORVASC) 10 MG tablet [[id number]]   montelukast (SINGULAIR) 10 MG tablet [[id number]]   Pharmacy:   [facility][id number] [place] Otto, [place] - [address] AT [facility] [address] [facility] [address] [place] [address] Phone: [phone]  Fax: [phone] DEA #: [id number]   Please advise pharmacist

## 2023-08-12 NOTE — Telephone Encounter (Signed)
Amlodipine and Singulair approved for refill.    Wasn't sure what to do with the [place].   It was ordered on 08/08/2023 #90, 0 refills but there is a note on the request about a drug change for the Farxiga from the pharmacy.

## 2023-08-27 ENCOUNTER — Encounter: Payer: Self-pay | Admitting: Family Medicine

## 2023-08-28 ENCOUNTER — Other Ambulatory Visit: Payer: Self-pay | Admitting: Family Medicine

## 2023-09-03 ENCOUNTER — Ambulatory Visit (INDEPENDENT_AMBULATORY_CARE_PROVIDER_SITE_OTHER): Payer: Medicare Other | Admitting: Family Medicine

## 2023-09-03 ENCOUNTER — Encounter: Payer: Self-pay | Admitting: Family Medicine

## 2023-09-03 VITALS — BP 126/82 | HR 70 | Temp 98.2°F | Ht 66.0 in | Wt 227.0 lb

## 2023-09-03 DIAGNOSIS — I10 Essential (primary) hypertension: Secondary | ICD-10-CM

## 2023-09-03 MED ORDER — VALSARTAN 80 MG PO TABS: 80.0000 mg | ORAL_TABLET | Freq: Every day | ORAL | 3 refills | Status: DC

## 2023-09-03 NOTE — Progress Notes (Signed)
Subjective:    Patient ID: Caitlyn [name], female    DOB: Sep 06, 1956, 67 y.o.   MRN: [id number]  At the patient's last visit, her GFR was found to be 41 indicating stage IIIb chronic kidney disease.  At that point, I discontinued metformin and started her on Farxiga 10 mg a day.  Also recommended switching amlodipine to valsartan 320 mg a day.  Since starting the valsartan, the patient had severe hypotension with systolic blood pressures 90-100.  Therefore she reduced the dose of the valsartan to 160 mg a day.  Her systolic blood pressures remain low at 110 Past Medical History:  Diagnosis Date   Asthma    Complication of anesthesia    24 yrs ago post tubal-- pulm. edema  (no problems post bowel surg. 2009)   Depression    Diabetes mellitus    oral med   Diverticulitis hx  -- w/ perf. bowel   s/p resection 2009   Hypertension    Psoriasis    Reflux occasional   watches diet    Past Surgical History:  Procedure Laterality Date   BOWEL RESECTION  2009   diverticulitis w/ perf. bowel   COLON SURGERY N/A    Phreesia 11/28/2020   HYSTEROSCOPY  10/09/2011   Procedure: HYSTEROSCOPY;  Surgeon: Meriel Pica;  Location: [facility];  Service: Gynecology;;  HYSTEROSCOPY D & C   TUBAL LIGATION  24 yrs ago    Current Outpatient Medications on File Prior to Visit  Medication Sig Dispense Refill   albuterol (VENTOLIN HFA) 108 (90 Base) MCG/ACT inhaler Inhale 2 puffs into the lungs every 4 (four) hours as needed for wheezing or shortness of breath. 8 g 2   aspirin EC 81 MG tablet Take 1 tablet (81 mg total) by mouth daily. Swallow whole. 30 tablet 11   COSENTYX SENSOREADY 300 DOSE 150 MG/ML SOAJ Inject 150 mg into the muscle every 30 (thirty) days.     dapagliflozin propanediol (FARXIGA) 10 MG TABS tablet Take 1 tablet (10 mg total) by mouth daily. 90 tablet 0   fexofenadine (ALLEGRA) 180 MG tablet Take 180 mg by mouth at bedtime.     Glucose Blood (BLOOD GLUCOSE TEST  STRIPS) STRP Use as directed to monitor FSBS 4x daily. Dx: E11.65. 100 strip 1   montelukast (SINGULAIR) 10 MG tablet TAKE 1 TABLET(10 MG) BY MOUTH AT BEDTIME 90 tablet 2   rosuvastatin (CRESTOR) 20 MG tablet TAKE 1 TABLET(20 MG) BY MOUTH DAILY 90 tablet 3   venlafaxine XR (EFFEXOR-XR) 150 MG 24 hr capsule TAKE 1 CAPSULE BY MOUTH EVERY DAY 60 capsule 3   cephALEXin (KEFLEX) 500 MG capsule Take 1 capsule (500 mg total) by mouth 3 (three) times daily. (Patient not taking: Reported on 09/03/2023) 15 capsule 0   sulfamethoxazole-trimethoprim (BACTRIM DS) 800-160 MG tablet Take 1 tablet by mouth 2 (two) times daily. (Patient not taking: Reported on 09/03/2023) 14 tablet 0   No current facility-administered medications on file prior to visit.      Allergies  Allergen Reactions   Erythromycin Other (See Comments)    Gi upset/ cramps Other reaction(s): Unknown   Hydrochlorothiazide     CAUSED GOUT  Advised to drink plenty of fluids to avoid crystallization in joints.    Pollen Extract    Macrolides And Ketolides Other (See Comments)    unknown   Social History   Socioeconomic History   Marital status: Married    Spouse name: Not  on file   Number of children: Not on file   Years of education: Not on file   Highest education level: Not on file  Occupational History   Occupation: accountant  Tobacco Use   Smoking status: Never   Smokeless tobacco: Never  Vaping Use   Vaping status: Never Used  Substance and Sexual Activity   Alcohol use: No   Drug use: No   Sexual activity: Not on file  Other Topics Concern   Not on file  Social History Narrative   Lives with her husband   1-2 cups daily of Caffeine    Social Determinants of Health   Financial Resource Strain: Not on file  Food Insecurity: Not on file  Transportation Needs: Not on file  Physical Activity: Not on file  Stress: Not on file  Social Connections: Not on file  Intimate Partner Violence: Not on file       Review of Systems     Objective:   Physical Exam Vitals reviewed.  Constitutional:      General: She is not in acute distress.    Appearance: Normal appearance. She is obese. She is not ill-appearing or toxic-appearing.  Cardiovascular:     Rate and Rhythm: Normal rate and regular rhythm.     Heart sounds: Normal heart sounds. No murmur heard.    No friction rub. No gallop.  Pulmonary:     Effort: Pulmonary effort is normal. No respiratory distress.     Breath sounds: No stridor. No wheezing or rales.  Abdominal:     General: Bowel sounds are normal. There is no distension.     Palpations: Abdomen is soft.     Tenderness: There is no abdominal tenderness. There is no guarding or rebound.  Musculoskeletal:     Left wrist: No crepitus.  Neurological:     Mental Status: She is alert.        Assessment & Plan:  Benign essential HTN - Plan: BASIC METABOLIC PANEL WITH GFR Reduce valsartan to 80 mg a day.  Continue Farxiga 10 mg a day.  Recheck blood work in 6 months.

## 2023-09-04 LAB — BASIC METABOLIC PANEL WITH GFR
BUN/Creatinine Ratio: 20 (calc) (ref 6–22)
BUN: 32 mg/dL — ABNORMAL HIGH (ref 7–25)
CO2: 23 mmol/L (ref 20–32)
Calcium: 9.4 mg/dL (ref 8.6–10.4)
Chloride: 108 mmol/L (ref 98–110)
Creat: 1.62 mg/dL — ABNORMAL HIGH (ref 0.50–1.05)
Glucose, Bld: 136 mg/dL — ABNORMAL HIGH (ref 65–99)
Potassium: 5.4 mmol/L — ABNORMAL HIGH (ref 3.5–5.3)
Sodium: 137 mmol/L (ref 135–146)
eGFR: 35 mL/min/{1.73_m2} — ABNORMAL LOW (ref >=60)

## 2023-09-11 ENCOUNTER — Other Ambulatory Visit: Payer: Medicare Other

## 2023-09-11 DIAGNOSIS — N1832 Chronic kidney disease, stage 3b: Secondary | ICD-10-CM

## 2023-09-11 DIAGNOSIS — I10 Essential (primary) hypertension: Secondary | ICD-10-CM

## 2023-09-12 ENCOUNTER — Encounter: Payer: Self-pay | Admitting: Family Medicine

## 2023-09-12 ENCOUNTER — Other Ambulatory Visit: Payer: Self-pay

## 2023-09-12 LAB — BASIC METABOLIC PANEL
BUN/Creatinine Ratio: 26 (calc) — ABNORMAL HIGH (ref 6–22)
BUN: 36 mg/dL — ABNORMAL HIGH (ref 7–25)
CO2: 22 mmol/L (ref 20–32)
Calcium: 9.6 mg/dL (ref 8.6–10.4)
Chloride: 106 mmol/L (ref 98–110)
Creat: 1.4 mg/dL — ABNORMAL HIGH (ref 0.50–1.05)
Glucose, Bld: 157 mg/dL — ABNORMAL HIGH (ref 65–99)
Potassium: 5.4 mmol/L — ABNORMAL HIGH (ref 3.5–5.3)
Sodium: 138 mmol/L (ref 135–146)

## 2023-09-12 MED ORDER — AMLODIPINE BESYLATE 10 MG PO TABS: 10.0000 mg | ORAL_TABLET | Freq: Every day | ORAL | 3 refills | Status: DC

## 2023-10-06 ENCOUNTER — Encounter: Payer: Self-pay | Admitting: Family Medicine

## 2023-10-06 ENCOUNTER — Other Ambulatory Visit: Payer: Self-pay | Admitting: Family Medicine

## 2023-10-06 MED ORDER — CEPHALEXIN 500 MG PO CAPS: 500.0000 mg | ORAL_CAPSULE | Freq: Three times a day (TID) | ORAL | 0 refills | Status: DC

## 2023-10-22 ENCOUNTER — Other Ambulatory Visit: Payer: Self-pay | Admitting: Family Medicine

## 2023-10-22 NOTE — Telephone Encounter (Signed)
Medication no longer on current medication list Requested Prescriptions  Pending Prescriptions Disp Refills   metFORMIN (GLUCOPHAGE) 1000 MG tablet [Pharmacy Med Name: METFORMIN 1000MG  TABLETS] 180 tablet 0    Sig: TAKE 1 TABLET(1000 MG) BY MOUTH TWICE DAILY WITH A MEAL     Endocrinology:  Diabetes - Biguanides Failed - 10/22/2023  2:12 PM      Failed - Cr in normal range and within 360 days    Creat  Date Value Ref Range Status  09/11/2023 1.40 (H) 0.50 - 1.05 mg/dL Final   Creatinine, Urine  Date Value Ref Range Status  06/10/2023 94 20 - 275 mg/dL Final         Failed - eGFR in normal range and within 360 days    GFR, Est African American  Date Value Ref Range Status  01/01/2019 76 > OR = 60 mL/min/1.46m2 Final   GFR, Est Non African American  Date Value Ref Range Status  01/01/2019 66 > OR = 60 mL/min/1.93m2 Final   GFR, Estimated  Date Value Ref Range Status  06/29/2023 39 (L) >60 mL/min Final    Comment:    (NOTE) Calculated using the CKD-EPI Creatinine Equation (2021)    eGFR  Date Value Ref Range Status  09/03/2023 35 (L) > OR = 60 mL/min/1.71m2 Final  11/30/2021 52 (L) >59 mL/min/1.73 Final         Failed - B12 Level in normal range and within 720 days    No results found for: "VITAMINB12"       Failed - Valid encounter within last 6 months    Recent Outpatient Visits           1 year ago Dysuria   [facility] Valentino Nose, NP   1 year ago Upper respiratory tract infection, unspecified type   [facility] Valentino Nose, NP   2 years ago Type 2 diabetes mellitus with other specified complication, unspecified whether long term insulin use (HCC)   [facility] Donita Brooks, MD   2 years ago Type 2 diabetes mellitus with other specified complication, unspecified whether long term insulin use (HCC)   [facility] Donita Brooks, MD   2 years ago Ganglion cyst of dorsum of  left wrist   [facility] Pickard, Priscille Heidelberg, MD              Passed - HBA1C is between 0 and 7.9 and within 180 days    Hgb A1c MFr Bld  Date Value Ref Range Status  08/07/2023 6.1 (H) <5.7 % of total Hgb Final    Comment:    For someone without known diabetes, a hemoglobin  A1c value between 5.7% and 6.4% is consistent with prediabetes and should be confirmed with a  follow-up test. . For someone with known diabetes, a value <7% indicates that their diabetes is well controlled. A1c targets should be individualized based on duration of diabetes, age, comorbid conditions, and other considerations. . This assay result is consistent with an increased risk of diabetes. . Currently, no consensus exists regarding use of hemoglobin A1c for diagnosis of diabetes for children. .          Passed - CBC within normal limits and completed in the last 12 months    WBC  Date Value Ref Range Status  08/07/2023 8.8 3.8 - 10.8 Thousand/uL Final   RBC  Date Value Ref Range Status  08/07/2023 4.12 3.80 - 5.10 Million/uL Final   Hemoglobin  Date Value Ref Range Status  08/07/2023 12.0 11.7 - 15.5 g/dL Final   HCT  Date Value Ref Range Status  08/07/2023 36.2 35.0 - 45.0 % Final   MCHC  Date Value Ref Range Status  08/07/2023 33.1 32.0 - 36.0 g/dL Final   [facility]  Date Value Ref Range Status  08/07/2023 29.1 27.0 - 33.0 pg Final   MCV  Date Value Ref Range Status  08/07/2023 87.9 80.0 - 100.0 fL Final   No results found for: "PLTCOUNTKUC", "LABPLAT", "POCPLA" RDW  Date Value Ref Range Status  08/07/2023 12.9 11.0 - 15.0 % Final

## 2023-10-28 ENCOUNTER — Encounter: Payer: Self-pay | Admitting: *Deleted

## 2023-10-28 ENCOUNTER — Ambulatory Visit: Payer: Self-pay | Admitting: *Deleted

## 2023-10-28 NOTE — Patient Outreach (Signed)
  Care Coordination   Initial Visit Note   10/28/2023  Name: Caitlyn [name] MRN: [id number] DOB: July 19, 1956  Caitlyn [name] is a 67 y.o. year old female who sees [name], Caitlyn Heidelberg, MD for primary care. I spoke with Caitlyn [name] [facility] by phone today.  What matters to the patients health and wellness today?  Completed assessment & no interventions identified at this time.  SDOH assessments and interventions completed:  Yes.  SDOH Interventions Today    Flowsheet Row Most Recent Value  SDOH Interventions   Food Insecurity Interventions Intervention Not Indicated  Housing Interventions Intervention Not Indicated  Transportation Interventions Intervention Not Indicated, Patient Resources (Friends/Family)  Utilities Interventions Intervention Not Indicated  Alcohol Usage Interventions Intervention Not Indicated (Score <7)  Financial Strain Interventions Intervention Not Indicated  Physical Activity Interventions Patient Declined  Stress Interventions Intervention Not Indicated  Social Connections Interventions Intervention Not Indicated  Health Literacy Interventions Intervention Not Indicated     Care Coordination Interventions:  No, not indicated.   Follow up plan: No further intervention required.   Encounter Outcome:  Patient Visit Completed.   Danford Bad, BSW, MSW, Printmaker Social Work Case Set designer Health  [facility], Population Health Direct Dial: [phone]  Fax: [phone] Email: [email or web address]

## 2023-11-05 ENCOUNTER — Other Ambulatory Visit: Payer: Self-pay | Admitting: Family Medicine

## 2023-11-06 ENCOUNTER — Other Ambulatory Visit: Payer: Self-pay | Admitting: Family Medicine

## 2023-11-06 NOTE — Telephone Encounter (Signed)
Requested Prescriptions  Pending Prescriptions Disp Refills   FARXIGA 10 MG TABS tablet [Pharmacy Med Name: FARXIGA 10MG  TABLETS] 90 tablet 0    Sig: TAKE 1 TABLET(10 MG) BY MOUTH DAILY     Endocrinology:  Diabetes - SGLT2 Inhibitors Failed - 11/06/2023  2:28 PM      Failed - Cr in normal range and within 360 days    Creat  Date Value Ref Range Status  09/11/2023 1.40 (H) 0.50 - 1.05 mg/dL Final   Creatinine, Urine  Date Value Ref Range Status  06/10/2023 94 20 - 275 mg/dL Final         Failed - eGFR in normal range and within 360 days    GFR, Est African American  Date Value Ref Range Status  01/01/2019 76 > OR = 60 mL/min/1.76m2 Final   GFR, Est Non African American  Date Value Ref Range Status  01/01/2019 66 > OR = 60 mL/min/1.62m2 Final   GFR, Estimated  Date Value Ref Range Status  06/29/2023 39 (L) >60 mL/min Final    Comment:    (NOTE) Calculated using the CKD-EPI Creatinine Equation (2021)    eGFR  Date Value Ref Range Status  09/03/2023 35 (L) > OR = 60 mL/min/1.36m2 Final  11/30/2021 52 (L) >59 mL/min/1.73 Final         Failed - Valid encounter within last 6 months    Recent Outpatient Visits           1 year ago Dysuria   [facility] Valentino Nose, NP   2 years ago Upper respiratory tract infection, unspecified type   [facility] Valentino Nose, NP   2 years ago Type 2 diabetes mellitus with other specified complication, unspecified whether long term insulin use (HCC)   [facility] Donita Brooks, MD   2 years ago Type 2 diabetes mellitus with other specified complication, unspecified whether long term insulin use (HCC)   [facility] Pickard, Priscille Heidelberg, MD   2 years ago Ganglion cyst of dorsum of left wrist   [facility] Pickard, Priscille Heidelberg, MD              Passed - HBA1C is between 0 and 7.9 and within 180 days    Hgb A1c MFr Bld  Date Value Ref  Range Status  08/07/2023 6.1 (H) <5.7 % of total Hgb Final    Comment:    For someone without known diabetes, a hemoglobin  A1c value between 5.7% and 6.4% is consistent with prediabetes and should be confirmed with a  follow-up test. . For someone with known diabetes, a value <7% indicates that their diabetes is well controlled. A1c targets should be individualized based on duration of diabetes, age, comorbid conditions, and other considerations. . This assay result is consistent with an increased risk of diabetes. . Currently, no consensus exists regarding use of hemoglobin A1c for diagnosis of diabetes for children. Marland Kitchen

## 2023-12-01 ENCOUNTER — Other Ambulatory Visit: Payer: Medicare Other

## 2023-12-01 ENCOUNTER — Encounter: Payer: Self-pay | Admitting: Family Medicine

## 2023-12-01 ENCOUNTER — Other Ambulatory Visit: Payer: Self-pay

## 2023-12-01 DIAGNOSIS — F40240 Claustrophobia: Secondary | ICD-10-CM

## 2023-12-01 DIAGNOSIS — R300 Dysuria: Secondary | ICD-10-CM

## 2023-12-01 MED ORDER — VENLAFAXINE HCL ER 150 MG PO CP24
ORAL_CAPSULE | ORAL | 2 refills | Status: DC
Start: 2023-12-01 — End: 2024-03-30

## 2023-12-02 ENCOUNTER — Other Ambulatory Visit: Payer: Self-pay | Admitting: Family Medicine

## 2023-12-02 LAB — URINALYSIS, ROUTINE W REFLEX MICROSCOPIC
Bilirubin Urine: NEGATIVE
Hyaline Cast: NONE SEEN /[LPF]
Ketones, ur: NEGATIVE
Nitrite: NEGATIVE
Specific Gravity, Urine: 1.022 (ref 1.001–1.035)
Squamous Epithelial / HPF: NONE SEEN /[HPF] (ref <=5)
WBC, UA: >=60 /[HPF] — AB (ref 0–5)
pH: 5.5 (ref 5.0–8.0)

## 2023-12-02 LAB — MICROSCOPIC MESSAGE

## 2023-12-02 MED ORDER — CEPHALEXIN 500 MG PO CAPS: 500.0000 mg | ORAL_CAPSULE | Freq: Three times a day (TID) | ORAL | 0 refills | Status: DC

## 2023-12-03 LAB — URINE CULTURE
MICRO NUMBER:: 15921660
SPECIMEN QUALITY:: ADEQUATE

## 2023-12-18 ENCOUNTER — Encounter: Payer: Self-pay | Admitting: Family Medicine

## 2023-12-18 ENCOUNTER — Ambulatory Visit: Payer: Medicare Other | Admitting: Family Medicine

## 2023-12-18 VITALS — BP 130/76 | HR 86 | Ht 66.0 in | Wt 233.0 lb

## 2023-12-18 DIAGNOSIS — R300 Dysuria: Secondary | ICD-10-CM

## 2023-12-18 LAB — URINALYSIS, ROUTINE W REFLEX MICROSCOPIC
Bilirubin Urine: NEGATIVE
Hyaline Cast: NONE SEEN /[LPF]
Ketones, ur: NEGATIVE
Nitrite: NEGATIVE
Specific Gravity, Urine: 1.02 (ref 1.001–1.035)
pH: 5.5 (ref 5.0–8.0)

## 2023-12-18 LAB — MICROSCOPIC MESSAGE

## 2023-12-18 MED ORDER — SULFAMETHOXAZOLE-TRIMETHOPRIM 800-160 MG PO TABS: 1.0000 | ORAL_TABLET | Freq: Two times a day (BID) | ORAL | 0 refills | Status: DC

## 2023-12-18 MED ORDER — TIRZEPATIDE 2.5 MG/0.5ML ~~LOC~~ SOAJ: 2.5000 mg | SUBCUTANEOUS | 3 refills | Status: DC

## 2023-12-18 NOTE — Progress Notes (Signed)
Subjective:    Patient ID: Caitlyn [name], female    DOB: 11/22/1956, 68 y.o.   MRN: [id number]  I have started the patient on Farxiga due to her type 2 diabetes and chronic kidney disease.  However I had to treat the patient twice for urinary tract infections.  She presents today again complaining of dysuria urgency and frequency.  Urinalysis shows +3 glucose as expected, trace blood, +2 protein, negative nitrates, +1 leukocyte esterase. Past Medical History:  Diagnosis Date   Asthma    Complication of anesthesia    24 yrs ago post tubal-- pulm. edema  (no problems post bowel surg. 2009)   Depression    Diabetes mellitus    oral med   Diverticulitis hx  -- w/ perf. bowel   s/p resection 2009   Hypertension    Psoriasis    Reflux occasional   watches diet    Past Surgical History:  Procedure Laterality Date   BOWEL RESECTION  2009   diverticulitis w/ perf. bowel   COLON SURGERY N/A    [facility]rees[facility] 11/28/2020   HYSTEROSCOPY  10/09/2011   Procedure: HYSTEROSCOPY;  Surgeon: Caitlyn [name];  Location: [facility];  Service: Gynecology;;  HYSTEROSCOPY D & C   TUBAL LIGATION  24 yrs ago    Current Outpatient Medications on File Prior to Visit  Medication Sig Dispense Refill   albuterol (VENTOLIN HFA) 108 (90 Base) MCG/ACT inhaler Inhale 2 puffs into the lungs every 4 (four) hours as needed for wheezing or shortness of breath. 8 g 2   amLODipine (NORVASC) 10 MG tablet Take 1 tablet (10 mg total) by mouth daily. 90 tablet 3   aspirin EC 81 MG tablet Take 1 tablet (81 mg total) by mouth daily. Swallow whole. 30 tablet 11   COSENTYX SENSOREADY 300 DOSE 150 MG/ML SOAJ Inject 150 mg into the muscle every 30 (thirty) days.     FARXIGA 10 MG TABS tablet TAKE 1 TABLET(10 MG) BY MOUTH DAILY 90 tablet 0   fexofenadine (ALLEGRA) 180 MG tablet Take 180 mg by mouth at bedtime.     Glucose Blood (BLOOD GLUCOSE TEST STRIPS) STRP Use as directed to monitor FSBS 4x daily. Dx: E11.65. 100  strip 1   montelukast (SINGULAIR) 10 MG tablet TAKE 1 TABLET(10 MG) BY MOUTH AT BEDTIME 90 tablet 2   rosuvastatin (CRESTOR) 20 MG tablet TAKE 1 TABLET(20 MG) BY MOUTH DAILY 90 tablet 3   venlafaxine XR (EFFEXOR-XR) 150 MG 24 hr capsule TAKE 1 CAPSULE BY MOUTH  EVERY DAY 90 capsule 2   No current facility-administered medications on file prior to visit.      Allergies  Allergen Reactions   Erythromycin Other (See Comments)    Gi upset/ cramps Other reaction(s): Unknown   Hydrochlorothiazide     CAUSED GOUT  Advised to drink plenty of fluids to avoid crystallization in joints.    Pollen Extract    Valsartan     hyperkalemia   Macrolides And Ketolides Other (See Comments)    unknown   Social History   Socioeconomic History   Marital status: Married    Spouse name: Caitlyn [name]   Number of children: Not on file   Years of education: 12   Highest education level: 12th grade  Occupational History   Occupation: accountant  Tobacco Use   Smoking status: Never    Passive exposure: Never   Smokeless tobacco: Never  Vaping Use   Vaping status: Never  Used  Substance and Sexual Activity   Alcohol use: No   Drug use: No   Sexual activity: Yes    Partners: Male  Other Topics Concern   Not on file  Social History Narrative   Lives with her husband   1-2 cups daily of Caffeine    Social Drivers of Health   Financial Resource Strain: Low Risk  (10/28/2023)   Overall Financial Resource Strain (CARDIA)    Difficulty of Paying Living Expenses: Not hard at all  Food Insecurity: No Food Insecurity (10/28/2023)   Hunger Vital Sign    Worried About Running Out of Food in the Last Year: Never true    Ran Out of Food in the Last Year: Never true  Transportation Needs: No Transportation Needs (10/28/2023)   PRAPARE - Administrator, Civil Service (Medical): No    Lack of Transportation (Non-Medical): No  Physical Activity: Inactive (10/28/2023)   Exercise Vital Sign     Days of Exercise per Week: 0 days    Minutes of Exercise per Session: 0 min  Stress: No Stress Concern Present (10/28/2023)   Caitlyn [name] of Occupational Health - Occupational Stress Questionnaire    Feeling of Stress : Only a little  Social Connections: Socially Integrated (10/28/2023)   Social Connection and Isolation Panel [NHANES]    Frequency of Communication with Friends and Family: More than three times a week    Frequency of Social Gatherings with Friends and Family: More than three times a week    Attends Religious Services: More than 4 times per year    Active Member of [employer] or Organizations: Yes    Attends Banker Meetings: More than 4 times per year    Marital Status: Married  Catering manager Violence: Not At Risk (10/28/2023)   Humiliation, Afraid, Rape, and Kick questionnaire    Fear of Current or Ex-Partner: No    Emotionally Abused: No    Physically Abused: No    Sexually Abused: No      Review of Systems     Objective:   Physical Exam Vitals reviewed.  Constitutional:      General: She is not in acute distress.    Appearance: Normal appearance. She is obese. She is not ill-appearing or toxic-appearing.  Cardiovascular:     Rate and Rhythm: Normal rate and regular rhythm.     Heart sounds: Normal heart sounds. No murmur heard.    No friction rub. No gallop.  Pulmonary:     Effort: Pulmonary effort is normal. No respiratory distress.     Breath sounds: No stridor. No wheezing or rales.  Abdominal:     General: Bowel sounds are normal. There is no distension.     Palpations: Abdomen is soft.     Tenderness: There is no abdominal tenderness. There is no guarding or rebound.  Musculoskeletal:     Left wrist: No crepitus.  Neurological:     Mental Status: She is alert.        Assessment & Plan:  Dysuria - Plan: Urinalysis, Routine w reflex microscopic, Urine Culture I believe the patient has another urinary tract infection.  I will  treat this with Bactrim double strength tablets twice daily for 7 days.  I believe Caitlyn [name] has triggered this as she has had 3 urinary tract infection since starting this medication per her report.  Therefore we will stop [place] and replace with Mounjaro 2.5 mg subcu weekly.  Patient is not  able to take metformin due to chronic kidney disease and her suppressed GFR.  Uptitrate Mounjaro as tolerated

## 2023-12-19 ENCOUNTER — Encounter: Payer: Self-pay | Admitting: Family Medicine

## 2023-12-20 LAB — URINE CULTURE
MICRO NUMBER:: 15989303
SPECIMEN QUALITY:: ADEQUATE

## 2024-01-09 ENCOUNTER — Other Ambulatory Visit: Payer: Self-pay

## 2024-01-09 ENCOUNTER — Encounter: Payer: Self-pay | Admitting: Family Medicine

## 2024-01-09 DIAGNOSIS — E7800 Pure hypercholesterolemia, unspecified: Secondary | ICD-10-CM

## 2024-01-09 DIAGNOSIS — E1169 Type 2 diabetes mellitus with other specified complication: Secondary | ICD-10-CM

## 2024-01-09 DIAGNOSIS — N1832 Chronic kidney disease, stage 3b: Secondary | ICD-10-CM

## 2024-01-09 MED ORDER — TIRZEPATIDE 5 MG/0.5ML ~~LOC~~ SOAJ
5.0000 mg | SUBCUTANEOUS | 0 refills | Status: DC
Start: 2024-01-09 — End: 2024-02-09

## 2024-01-19 ENCOUNTER — Encounter: Payer: Self-pay | Admitting: Family Medicine

## 2024-02-09 ENCOUNTER — Other Ambulatory Visit: Payer: Self-pay | Admitting: Family Medicine

## 2024-02-09 DIAGNOSIS — E7800 Pure hypercholesterolemia, unspecified: Secondary | ICD-10-CM

## 2024-02-09 DIAGNOSIS — N1832 Chronic kidney disease, stage 3b: Secondary | ICD-10-CM

## 2024-02-09 DIAGNOSIS — E1169 Type 2 diabetes mellitus with other specified complication: Secondary | ICD-10-CM

## 2024-02-09 MED ORDER — TIRZEPATIDE 5 MG/0.5ML ~~LOC~~ SOAJ
5.0000 mg | SUBCUTANEOUS | 0 refills | Status: DC
Start: 2024-02-09 — End: 2024-07-06

## 2024-03-30 ENCOUNTER — Encounter: Payer: Self-pay | Admitting: Family Medicine

## 2024-03-30 ENCOUNTER — Ambulatory Visit (INDEPENDENT_AMBULATORY_CARE_PROVIDER_SITE_OTHER): Admitting: Family Medicine

## 2024-03-30 VITALS — BP 126/80 | HR 97 | Temp 97.8°F | Ht 66.0 in | Wt 205.0 lb

## 2024-03-30 DIAGNOSIS — I152 Hypertension secondary to endocrine disorders: Secondary | ICD-10-CM

## 2024-03-30 DIAGNOSIS — E1169 Type 2 diabetes mellitus with other specified complication: Secondary | ICD-10-CM

## 2024-03-30 DIAGNOSIS — N1832 Chronic kidney disease, stage 3b: Secondary | ICD-10-CM

## 2024-03-30 DIAGNOSIS — Z7985 Long-term (current) use of injectable non-insulin antidiabetic drugs: Secondary | ICD-10-CM

## 2024-03-30 DIAGNOSIS — R062 Wheezing: Secondary | ICD-10-CM

## 2024-03-30 DIAGNOSIS — F40240 Claustrophobia: Secondary | ICD-10-CM

## 2024-03-30 DIAGNOSIS — E7800 Pure hypercholesterolemia, unspecified: Secondary | ICD-10-CM | POA: Diagnosis not present

## 2024-03-30 DIAGNOSIS — R300 Dysuria: Secondary | ICD-10-CM

## 2024-03-30 DIAGNOSIS — E1159 Type 2 diabetes mellitus with other circulatory complications: Secondary | ICD-10-CM

## 2024-03-30 DIAGNOSIS — J45909 Unspecified asthma, uncomplicated: Secondary | ICD-10-CM

## 2024-03-30 MED ORDER — CEPHALEXIN 500 MG PO CAPS: 500.0000 mg | ORAL_CAPSULE | Freq: Three times a day (TID) | ORAL | 0 refills | Status: DC

## 2024-03-30 MED ORDER — VENLAFAXINE HCL ER 150 MG PO CP24
ORAL_CAPSULE | ORAL | 3 refills | Status: DC
  Filled 2024-07-21 – 2024-08-14 (×2): qty 90, 90d supply, fill #0
  Filled 2024-09-01: qty 90, 90d supply, fill #1

## 2024-03-30 MED ORDER — MONTELUKAST SODIUM 10 MG PO TABS: 10.0000 mg | ORAL_TABLET | Freq: Every day | ORAL | 3 refills | Status: DC

## 2024-03-30 MED ORDER — ROSUVASTATIN CALCIUM 20 MG PO TABS
20.0000 mg | ORAL_TABLET | Freq: Every day | ORAL | 3 refills | Status: AC
  Filled 2024-07-21 – 2024-09-01 (×2): qty 90, 90d supply, fill #0
  Filled 2024-11-22: qty 90, 90d supply, fill #1

## 2024-03-30 NOTE — Progress Notes (Signed)
 Subjective:    Patient ID: Caitlyn [name], female    DOB: 1956/10/01, 68 y.o.   MRN: [id number] Patient is coming in today complaining of low blood pressure.  Her blood pressures at home are typically under 110 systolic.  She is taking amlodipine  10 mg a day.  She does not feel dizzy at times from this.  She also is having terrible nausea and vomiting due to Mounjaro.  She wants to discontinue the medication altogether.  She states that she is very sick after taking the shots.  She also reports urinary frequency and urgency and dysuria Past Medical History:  Diagnosis Date   Asthma    Complication of anesthesia    24 yrs ago post tubal-- pulm. edema  (no problems post bowel surg. 2009)   Depression    Diabetes mellitus    oral med   Diverticulitis hx  -- w/ perf. bowel   s/p resection 2009   Hypertension    Psoriasis    Reflux occasional   watches diet    Past Surgical History:  Procedure Laterality Date   BOWEL RESECTION  2009   diverticulitis w/ perf. bowel   COLON SURGERY N/A    [employer] 11/28/2020   HYSTEROSCOPY  10/09/2011   Procedure: HYSTEROSCOPY;  Surgeon: Piedad Brewer;  Location: [facility];  Service: Gynecology;;  HYSTEROSCOPY D & C   TUBAL LIGATION  24 yrs ago    Current Outpatient Medications on File Prior to Visit  Medication Sig Dispense Refill   albuterol  (VENTOLIN  HFA) 108 (90 Base) MCG/ACT inhaler Inhale 2 puffs into the lungs every 4 (four) hours as needed for wheezing or shortness of breath. 8 g 2   amLODipine  (NORVASC ) 10 MG tablet Take 1 tablet (10 mg total) by mouth daily. 90 tablet 3   aspirin  EC 81 MG tablet Take 1 tablet (81 mg total) by mouth daily. Swallow whole. 30 tablet 11   COSENTYX SENSOREADY 300 DOSE 150 MG/ML SOAJ Inject 150 mg into the muscle every 30 (thirty) days.     fexofenadine  (ALLEGRA ) 180 MG tablet Take 180 mg by mouth at bedtime.     Glucose Blood (BLOOD GLUCOSE TEST STRIPS) STRP Use as directed to monitor FSBS 4x  daily. Dx: E11.65. 100 strip 1   tirzepatide (MOUNJARO) 5 MG/0.5ML Pen Inject 5 mg into the skin once a week. 6 mL 0   No current facility-administered medications on file prior to visit.      Allergies  Allergen Reactions   Erythromycin Other (See Comments)    Gi upset/ cramps Other reaction(s): Unknown   Hydrochlorothiazide      CAUSED GOUT  Advised to drink plenty of fluids to avoid crystallization in joints.    Pollen Extract    Valsartan      hyperkalemia   Macrolides And Ketolides Other (See Comments)    unknown   Social History   Socioeconomic History   Marital status: Married    Spouse name: Mykel Zwicky   Number of children: Not on file   Years of education: 12   Highest education level: Bachelor's degree (e.g., BA, AB, BS)  Occupational History   Occupation: accountant  Tobacco Use   Smoking status: Never    Passive exposure: Never   Smokeless tobacco: Never  Vaping Use   Vaping status: Never Used  Substance and Sexual Activity   Alcohol use: No   Drug use: No   Sexual activity: Yes    Partners: Male  Other Topics Concern   Not on file  Social History Narrative   Lives with her husband   1-2 cups daily of Caffeine    Social Drivers of Health   Financial Resource Strain: Patient Declined (03/29/2024)   Overall Financial Resource Strain (CARDIA)    Difficulty of Paying Living Expenses: Patient declined  Food Insecurity: Patient Declined (03/29/2024)   Hunger Vital Sign    Worried About Running Out of Food in the Last Year: Patient declined    Ran Out of Food in the Last Year: Patient declined  Transportation Needs: Patient Declined (03/29/2024)   PRAPARE - Administrator, Civil Service (Medical): Patient declined    Lack of Transportation (Non-Medical): Patient declined  Physical Activity: Inactive (10/28/2023)   Exercise Vital Sign    Days of Exercise per Week: 0 days    Minutes of Exercise per Session: 0 min  Stress: No Stress Concern  Present (10/28/2023)   [employer] of Occupational Health - Occupational Stress Questionnaire    Feeling of Stress : Only a little  Social Connections: Unknown (03/29/2024)   Social Connection and Isolation Panel [NHANES]    Frequency of Communication with Friends and Family: Patient declined    Frequency of Social Gatherings with Friends and Family: Patient declined    Attends Religious Services: Patient declined    Database administrator or Organizations: Patient declined    Attends Engineer, structural: More than 4 times per year    Marital Status: Patient declined  Intimate Partner Violence: Not At Risk (10/28/2023)   Humiliation, Afraid, Rape, and Kick questionnaire    Fear of Current or Ex-Partner: No    Emotionally Abused: No    Physically Abused: No    Sexually Abused: No      Review of Systems     Objective:   Physical Exam Vitals reviewed.  Constitutional:      General: She is not in acute distress.    Appearance: Normal appearance. She is obese. She is not ill-appearing or toxic-appearing.  Cardiovascular:     Rate and Rhythm: Normal rate and regular rhythm.     Heart sounds: Normal heart sounds. No murmur heard.    No friction rub. No gallop.  Pulmonary:     Effort: Pulmonary effort is normal. No respiratory distress.     Breath sounds: No stridor. No wheezing or rales.  Abdominal:     General: Bowel sounds are normal. There is no distension.     Palpations: Abdomen is soft.     Tenderness: There is no abdominal tenderness. There is no guarding or rebound.  Musculoskeletal:     Left wrist: No crepitus.  Neurological:     Mental Status: She is alert.        Assessment & Plan:  Hypertension associated with diabetes (HCC)  Type 2 diabetes mellitus with other specified complication, unspecified whether long term insulin  use (HCC) - Plan: CBC with Differential/Platelet, COMPLETE METABOLIC PANEL WITHOUT GFR, Lipid panel, Hemoglobin A1c  Pure  hypercholesterolemia - Plan: rosuvastatin  (CRESTOR ) 20 MG tablet  Stage 3b chronic kidney disease (HCC)  Claustrophobia - Plan: venlafaxine  XR (EFFEXOR -XR) 150 MG 24 hr capsule  Mild asthma without complication, unspecified whether persistent - Plan: montelukast  (SINGULAIR ) 10 MG tablet  Wheezing due to allergy - Plan: montelukast  (SINGULAIR ) 10 MG tablet  Dysuria - Plan: Urinalysis, Routine w reflex microscopic Patient has a history of frequent UTIs therefore I would not resume Farxiga  or switch  to Jardiance .  She cannot tolerate metformin  due to chronic kidney disease.  We will discontinue Mounjaro due to the nausea vomiting.  I will check an A1c today.  If significantly elevated, I will place Mounjaro with pioglitazone 30 mg a day.  Obtain urinalysis.  Meanwhile treat urinary tract infection with Keflex  500 mg p.o. 3 times daily for 7 days.  Decrease amlodipine  to 5 mg a day.  Check fasting lipid panel.

## 2024-03-30 NOTE — Addendum Note (Signed)
 Addended by: Gillermo Lack K on: 03/30/2024 09:21 AM   Modules accepted: Orders

## 2024-03-31 LAB — URINALYSIS, ROUTINE W REFLEX MICROSCOPIC
Bilirubin Urine: NEGATIVE
Glucose, UA: NEGATIVE
Nitrite: POSITIVE — AB
Specific Gravity, Urine: 1.019 (ref 1.001–1.035)
WBC, UA: >=60 /HPF — AB (ref 0–5)
pH: 5.5 (ref 5.0–8.0)

## 2024-03-31 LAB — COMPLETE METABOLIC PANEL WITHOUT GFR
AG Ratio: 1.5 (calc) (ref 1.0–2.5)
ALT: 12 U/L (ref 6–29)
AST: 14 U/L (ref 10–35)
Albumin: 4.5 g/dL (ref 3.6–5.1)
Alkaline phosphatase (APISO): 93 U/L (ref 37–153)
BUN/Creatinine Ratio: 16 (calc) (ref 6–22)
BUN: 28 mg/dL — ABNORMAL HIGH (ref 7–25)
CO2: 24 mmol/L (ref 20–32)
Calcium: 9.6 mg/dL (ref 8.6–10.4)
Chloride: 104 mmol/L (ref 98–110)
Creat: 1.78 mg/dL — ABNORMAL HIGH (ref 0.50–1.05)
Globulin: 3.1 g/dL (ref 1.9–3.7)
Glucose, Bld: 124 mg/dL — ABNORMAL HIGH (ref 65–99)
Potassium: 4.9 mmol/L (ref 3.5–5.3)
Sodium: 140 mmol/L (ref 135–146)
Total Bilirubin: 0.6 mg/dL (ref 0.2–1.2)
Total Protein: 7.6 g/dL (ref 6.1–8.1)

## 2024-03-31 LAB — CBC WITH DIFFERENTIAL/PLATELET
Absolute Lymphocytes: 1824 {cells}/uL (ref 850–3900)
Absolute Monocytes: 532 {cells}/uL (ref 200–950)
Basophils Absolute: 57 {cells}/uL (ref 0–200)
Basophils Relative: 0.6 %
Eosinophils Absolute: 247 {cells}/uL (ref 15–500)
Eosinophils Relative: 2.6 %
HCT: 40.2 % (ref 35.0–45.0)
Hemoglobin: 13.4 g/dL (ref 11.7–15.5)
MCH: 29.7 pg (ref 27.0–33.0)
MCHC: 33.3 g/dL (ref 32.0–36.0)
MCV: 89.1 fL (ref 80.0–100.0)
MPV: 10.3 fL (ref 7.5–12.5)
Monocytes Relative: 5.6 %
Neutro Abs: 6840 {cells}/uL (ref 1500–7800)
Neutrophils Relative %: 72 %
Platelets: 343 10*3/uL (ref 140–400)
RBC: 4.51 10*6/uL (ref 3.80–5.10)
RDW: 13.1 % (ref 11.0–15.0)
Total Lymphocyte: 19.2 %
WBC: 9.5 10*3/uL (ref 3.8–10.8)

## 2024-03-31 LAB — MICROSCOPIC MESSAGE

## 2024-03-31 LAB — LIPID PANEL
Cholesterol: 116 mg/dL (ref <200)
HDL: 58 mg/dL (ref >=50)
LDL Cholesterol (Calc): 42 mg/dL
Non-HDL Cholesterol (Calc): 58 mg/dL (ref <130)
Total CHOL/HDL Ratio: 2 (calc) (ref <5.0)
Triglycerides: 77 mg/dL (ref <150)

## 2024-03-31 LAB — HEMOGLOBIN A1C
Hgb A1c MFr Bld: 5.9 % — ABNORMAL HIGH (ref <5.7)
Mean Plasma Glucose: 123 mg/dL
eAG (mmol/L): 6.8 mmol/L

## 2024-04-01 LAB — URINE CULTURE
MICRO NUMBER:: 16419061
SPECIMEN QUALITY:: ADEQUATE

## 2024-04-14 ENCOUNTER — Other Ambulatory Visit

## 2024-04-14 DIAGNOSIS — E1169 Type 2 diabetes mellitus with other specified complication: Secondary | ICD-10-CM

## 2024-04-14 DIAGNOSIS — N1832 Chronic kidney disease, stage 3b: Secondary | ICD-10-CM

## 2024-04-14 DIAGNOSIS — R300 Dysuria: Secondary | ICD-10-CM

## 2024-04-14 LAB — URINALYSIS, ROUTINE W REFLEX MICROSCOPIC
Casts: NONE SEEN /LPF
Crystals: NONE SEEN /HPF
Glucose, UA: NEGATIVE
Nitrite: NEGATIVE
Specific Gravity, Urine: 1.025 (ref 1.001–1.035)
Yeast: NONE SEEN /HPF
pH: 5.5 (ref 5.0–8.0)

## 2024-04-14 LAB — BASIC METABOLIC PANEL WITH GFR
BUN/Creatinine Ratio: 16 (calc) (ref 6–22)
BUN: 25 mg/dL (ref 7–25)
CO2: 23 mmol/L (ref 20–32)
Calcium: 9.5 mg/dL (ref 8.6–10.4)
Chloride: 103 mmol/L (ref 98–110)
Creat: 1.61 mg/dL — ABNORMAL HIGH (ref 0.50–1.05)
Glucose, Bld: 154 mg/dL — ABNORMAL HIGH (ref 65–99)
Potassium: 5.3 mmol/L (ref 3.5–5.3)
Sodium: 136 mmol/L (ref 135–146)
eGFR: 35 mL/min/{1.73_m2} — ABNORMAL LOW (ref >=60)

## 2024-04-14 LAB — MICROSCOPIC MESSAGE

## 2024-04-15 ENCOUNTER — Other Ambulatory Visit: Payer: Self-pay

## 2024-04-15 ENCOUNTER — Ambulatory Visit: Payer: Self-pay | Admitting: Family Medicine

## 2024-04-15 DIAGNOSIS — N390 Urinary tract infection, site not specified: Secondary | ICD-10-CM

## 2024-04-16 ENCOUNTER — Other Ambulatory Visit

## 2024-04-26 ENCOUNTER — Encounter: Payer: Self-pay | Admitting: Family Medicine

## 2024-04-26 ENCOUNTER — Other Ambulatory Visit: Payer: Self-pay

## 2024-04-26 MED ORDER — BLOOD GLUCOSE TEST VI STRP: ORAL_STRIP | 2 refills | Status: DC

## 2024-04-26 NOTE — Telephone Encounter (Signed)
 Order filled and sent to [facility].

## 2024-04-30 DIAGNOSIS — N390 Urinary tract infection, site not specified: Secondary | ICD-10-CM | POA: Insufficient documentation

## 2024-04-30 NOTE — Progress Notes (Signed)
 Name: Caitlyn [name] DOB: 1956/11/15 MRN: [id number]  History of Present Illness: Caitlyn [name] is a 68 y.o. female who presents today as a new patient at [name]. All available relevant medical records have been reviewed.   She reports chief complaint of recurrent UTls.  Urine culture results in past 12 months: - 06/03/2023: Positive for Klebsiella pneumoniae - 12/01/2023: Positive for E. coli - 12/18/2023: Positive for E. coli - 03/30/2024: Positive for E. coli  Urinary Symptoms: She reports 3+ UTl's in the last year. When present, UTI symptoms include dysuria, increased urinary urgency, frequency. She denies acute UTI symptoms today.  At baseline: She reports urinary urgency and nocturia x2-4. Denies daytime urinary frequency. Denies straining to void. Reports weak urinary stream, sensations of incomplete emptying, and occasional hesitancy. Denies significant bother due to nocturia - states she is able to fall back asleep easily & quickly; she manages it with minimizing evening fluid intake. Denies significant caffeine intake.  She reports occasional urge incontinence. She reports stress incontinence with cough/heavy lifting. She denies enuresis. She leaks 1-2 times per week. Denies wearing pads / diapers. She states the SUI is predominant.  She states the leakage is not significantly bothersome.  In terms of treatment, She has tried nothing.  She denies history of pyelonephritis.  She denies history of kidney stones.  Vaginal / prolapse Symptoms: She denies vaginal bulge sensation.  She denies seeing a vaginal bulge. She denies vaginal pain, bleeding, or discharge.   Bowel Symptoms: History of Crohn's disease and partial colectomy. Reports variable stool consistency and episodes of fecal incontinence. States she has what sounds like rectal prolapse, which she states is manageable. Reports being up to date with colonoscopy.  Past OB/GYN History: OB  History   No obstetric history on file.    She denies being sexually active.  She is post menopausal.   Medications: Current Outpatient Medications  Medication Sig Dispense Refill   albuterol  (VENTOLIN  HFA) 108 (90 Base) MCG/ACT inhaler Inhale 2 puffs into the lungs every 4 (four) hours as needed for wheezing or shortness of breath. 8 g 2   aspirin  EC 81 MG tablet Take 1 tablet (81 mg total) by mouth daily. Swallow whole. 30 tablet 11   cephALEXin  (KEFLEX ) 250 MG capsule Take 1 capsule (250 mg total) by mouth daily. 30 capsule 2   COSENTYX SENSOREADY 300 DOSE 150 MG/ML SOAJ Inject 150 mg into the muscle every 30 (thirty) days.     estradiol (ESTRACE) 0.1 MG/GM vaginal cream Discard plastic applicator. Insert a blueberry size amount (approximately 1 gram) of cream on fingertip inside vagina at bedtime every night for 1 week then every other night. For long term use. 42.5 g 3   fexofenadine  (ALLEGRA ) 180 MG tablet Take 180 mg by mouth at bedtime.     montelukast  (SINGULAIR ) 10 MG tablet Take 1 tablet (10 mg total) by mouth at bedtime. 90 tablet 3   rosuvastatin  (CRESTOR ) 20 MG tablet Take 1 tablet (20 mg total) by mouth daily. 90 tablet 3   venlafaxine  XR (EFFEXOR -XR) 150 MG 24 hr capsule TAKE 1 CAPSULE BY MOUTH  EVERY DAY 90 capsule 3   amLODipine  (NORVASC ) 10 MG tablet Take 1 tablet (10 mg total) by mouth daily. (Patient not taking: Reported on 05/10/2024) 90 tablet 3   Glucose Blood (BLOOD GLUCOSE TEST STRIPS) STRP Use as directed to monitor FSBS 4x daily. Dx: E11.65. (Patient not taking: Reported on 05/10/2024) 100 strip 2  tirzepatide  (MOUNJARO ) 5 MG/0.5ML Pen Inject 5 mg into the skin once a week. (Patient not taking: Reported on 05/10/2024) 6 mL 0   No current facility-administered medications for this visit.    Allergies: Allergies  Allergen Reactions   Erythromycin Other (See Comments)    Gi upset/ cramps Other reaction(s): Unknown   Hydrochlorothiazide      CAUSED  GOUT  Advised to drink plenty of fluids to avoid crystallization in joints.    Pollen Extract    Valsartan      hyperkalemia   Macrolides And Ketolides Other (See Comments)    unknown    Past Medical History:  Diagnosis Date   Asthma    Complication of anesthesia    24 yrs ago post tubal-- pulm. edema  (no problems post bowel surg. 2009)   Depression    Diabetes mellitus    oral med   Diverticulitis hx  -- w/ perf. bowel   s/p resection 2009   Hypertension    Psoriasis    Reflux occasional   watches diet   Past Surgical History:  Procedure Laterality Date   BOWEL RESECTION  2009   diverticulitis w/ perf. bowel   COLON SURGERY N/A    [employer]reesia 11/28/2020   HYSTEROSCOPY  10/09/2011   Procedure: HYSTEROSCOPY;  Surgeon: Piedad Brewer;  Location: [name];  Service: Gynecology;;  HYSTEROSCOPY D & C   TUBAL LIGATION  24 yrs ago   Family History  Problem Relation Age of Onset   Stroke Father    Diabetes Brother    Social History   Socioeconomic History   Marital status: Married    Spouse name: Wilsie Kern   Number of children: Not on file   Years of education: 12   Highest education level: Bachelor's degree (e.g., BA, AB, BS)  Occupational History   Occupation: accountant  Tobacco Use   Smoking status: Never    Passive exposure: Never   Smokeless tobacco: Never  Vaping Use   Vaping status: Never Used  Substance and Sexual Activity   Alcohol use: No   Drug use: No   Sexual activity: Yes    Partners: Male  Other Topics Concern   Not on file  Social History Narrative   Lives with her husband   1-2 cups daily of Caffeine    Social Drivers of Health   Financial Resource Strain: Patient Declined (03/29/2024)   Overall Financial Resource Strain (CARDIA)    Difficulty of Paying Living Expenses: Patient declined  Food Insecurity: Patient Declined (03/29/2024)   Hunger Vital Sign    Worried About Running Out of Food in the Last Year: Patient  declined    Ran Out of Food in the Last Year: Patient declined  Transportation Needs: Patient Declined (03/29/2024)   PRAPARE - Administrator, Civil Service (Medical): Patient declined    Lack of Transportation (Non-Medical): Patient declined  Physical Activity: Inactive (10/28/2023)   Exercise Vital Sign    Days of Exercise per Week: 0 days    Minutes of Exercise per Session: 0 min  Stress: No Stress Concern Present (10/28/2023)   [employer] of Occupational Health - Occupational Stress Questionnaire    Feeling of Stress : Only a little  Social Connections: Unknown (03/29/2024)   Social Connection and Isolation Panel    Frequency of Communication with Friends and Family: Patient declined    Frequency of Social Gatherings with Friends and Family: Patient declined    Attends Religious  Services: Patient declined    Active Member of Clubs or Organizations: Patient declined    Attends Banker Meetings: More than 4 times per year    Marital Status: Patient declined  Intimate Partner Violence: Not At Risk (10/28/2023)   Humiliation, Afraid, Rape, and Kick questionnaire    Fear of Current or Ex-Partner: No    Emotionally Abused: No    Physically Abused: No    Sexually Abused: No    SUBJECTIVE  Review of Systems Constitutional: Patient denies any unintentional weight loss or change in strength ENT: Denies dry eyes, reports dry mouth lntegumentary: Patient denies any rashes or pruritus Cardiovascular: Patient denies chest pain or syncope Respiratory: Patient denies shortness of breath Gastrointestinal: As per HPI Musculoskeletal: Patient denies muscle cramps or weakness Neurologic: Patient denies convulsions or seizures Allergic/Immunologic: Patient denies recent allergic reaction(s) Hematologic/Lymphatic: Patient denies bleeding tendencies Endocrine: Patient denies heat/cold intolerance  GU: As per HPI.  OBJECTIVE Vitals:   05/10/24 1121  BP: (!)  146/84  Pulse: 83   There is no height or weight on file to calculate BMI.  Physical Examination Constitutional: No obvious distress; patient is non-toxic appearing  Cardiovascular: No visible lower extremity edema.  Respiratory: The patient does not have audible wheezing/stridor; respirations do not appear labored  Gastrointestinal: Abdomen non-distended Musculoskeletal: Normal ROM of UEs  Skin: No obvious rashes/open sores  Neurologic: CN 2-12 grossly intact Psychiatric: Answered questions appropriately with normal affect  Hematologic/Lymphatic/Immunologic: No obvious bruises or sites of spontaneous bleeding  Urine microscopy: unremarkable PVR: 0 ml  ASSESSMENT Recurrent UTI - Plan: Urinalysis, Routine w reflex microscopic, BLADDER SCAN AMB NON-IMAGING, estradiol (ESTRACE) 0.1 MG/GM vaginal cream, cephALEXin  (KEFLEX ) 250 MG capsule  Recurrent UTls:  We discussed the possible etiologies of recurrent UTls including ascending infection related to intercourse; vaginal atrophy; transmural infection that has been treated incompletely; urinary tract stones; incomplete bladder emptying with urinary stasis; kidney or bladder tumor; urethral diverticulum; and colonization of  vagina and urinary tract with pathologic, adherent organisms.   She has additional infection risk factors including T2DM and use of an immunomodulatory medication for her psoriasis (Cosentyx).  For UTI prevention advised: > Maintain adequate fluid intake daily to flush out the urinary tract. > Go to the bathroom to urinate every 4-6 hours while awake to minimize urinary stasis / bacterial overgrowth in the bladder. > Starting topical vaginal estrogen cream. The rationale, appropriate use, and potential pros / cons were discussed in detail.  > UTI prophylaxis with a daily low dose antibiotic.   Handout provided with additional recommendations / options for UTI prevention.   For management of acute UTI symptoms: -  Discussed option to take over-the-counter Pyridium (commonly known under the AZO brand name) for bladder pain. No more than 3 days at a time.  Patient ultimately decided to start with topical vaginal estrogen cream, Keflex  (Cephalexin ) 250 mg daily for UTI prophylaxis for 3 months, and will consider OTC supplements for UTI prevention. Handout provided.  We agreed to plan for follow up in 3 months or sooner if needed.  Patient verbalized understanding of and agreement with current plan. All questions were answered.  PLAN Advised the following: 1. Topical vaginal estrogen cream as prescribed. 2. Keflex  (Cephalexin ) 250 mg daily. 3. Maintain adequate fluid intake daily to flush out the urinary tract. 4. Urinate every 4-6 hours while awake to minimize urinary stasis / bacterial overgrowth in the bladder. 5. Consider OTC supplements for UTI prevention. 6. Return in about  3 months (around 08/10/2024) for Recurrent UTI, with UA & PVR.  Orders Placed This Encounter  Procedures   Urinalysis, Routine w reflex microscopic   BLADDER SCAN AMB NON-IMAGING   Total time spent caring for the patient today was over 45 minutes. This includes time spent on the date of the visit reviewing the patient's chart before the visit, time spent during the visit, and time spent after the visit on documentation. Over 50% of that time was spent in face-to-face time with this patient for direct counseling. E&M based on time and complexity of medical decision making.  It has been explained that the patient is to follow regularly with their PCP in addition to all other providers involved in their care and to follow instructions provided by these respective offices. Patient advised to contact urology clinic if any urologic-pertaining questions, concerns, new symptoms or problems arise in the interim period.  Patient Instructions  UTI prevention / management:  Difference between Urinalysis (urine dipstick test) and Urine  culture:  Urinalysis (urine dipstick test): A quick office test used as an indicator to determine whether or not further testing is necessary (such as a urine culture, urine microscopy, etc.) The urinalysis cannot differentiate a true bacterial UTI or give a definitive diagnosis for the findings.  Urine culture: May be performed based on the findings of a urinalysis to evaluate for UTI. Grows out on a petri dish for 48-72 hours. Provides important information about: whether or not bacterial growth is present and if so: what the predominant bacteria is which antibiotics will work best against that bacteria That information is important so that we can diagnose and treat patients appropriately as there are other conditions which may mimic UTls which must not be missed (such as cancer, interstitial cystitis, stones, etc.). Assists us  with antibiotic stewardship to minimize patient's risk for developing antibiotic resistance (getting to a point where no antibiotics work anymore).  Options when UTI symptoms occur: 1. Call [facility] [place] and request to speak with triage nurse (phone # [phone], select option 3). In accordance with clinic guidelines the nurse will determine next steps based on patient-reported symptoms, which may include: same-day lab visit to provide urine specimen, recommendation to schedule Urology office visit appointment for further evaluation, recommendation to proceed to ER, etc. 2. Call your Primary Care Provider (PCP) office to request urgent / same-day visit. Be sure to request for urine culture to be ordered and have results faxed to Urology (fax # [phone]).  3. Go to urgent care. Be sure to request for urine culture to be ordered and have results faxed to Urology (fax # [phone]).   For bladder pain/ burning with urination: - Can take over-the-counter Pyridium (phenazopyridine; commonly known under the AZO brand) for a few days as needed.  Limit use to no more than 3 days consecutively due to risk for methemoglobinemia, liver function issues, and bone health damage with long term use of Pyridium. - Alternative: Prescription urinary analgesics (such as Uribel, Urogesic blue, Urelle, Uro-MP). Often expensive / poorly covered by insurance unfortunately.  Options / recommendations for UTI prevention: - Low dose antibiotic daily for UTI prophylaxis. - Topical vaginal estrogen for vaginal atrophy (aka Genitourinary Syndrome of Menopause (GSM)). - Adequate daily fluid intake to flush out the urinary tract. - Go to the bathroom to urinate every 4-6 hours while awake to minimize urinary stasis / bacterial overgrowth in the bladder. - Proanthocyanidin (PAC) supplement 36 mg daily; must be soluble (insoluble form  of PAC will be ineffective). Recommended brand: Ellura. This is an over-the-counter supplement (often must be found/ purchased online) supplement derived from cranberries with concentrated active component: Proanthocyanidin (PAC) 36 mg daily. Decreases bacterial adherence to bladder lining.  - D-mannose powder (2 grams daily). This is an over-the-counter supplement which decreases bacterial adherence to bladder lining (it is a sugar that inhibits bacterial adherence to urothelial cells by binding to the pili of enteric bacteria). Take as per manufacturer recommendation. Can be used as an alternative or in addition to the concentrated cranberry supplement.  - Vitamin C supplement to acidify urine to minimize bacterial growth.  - Probiotic to maintain healthy vaginal microbiome to suppress bacteria at urethral opening. Brand recommendations: Estill Hemming (includes probiotic & D-mannose ), Feminine Balance (highest concentration of lactobacillus) or Hyperbiotic Pro 15.  Note for patients with diabetes:  - Be aware that D-mannose contains sugar.    Vaginal atrophy I Genitourinary syndrome of menopause (GSM):  What it is: Changes in the vaginal  environment (including the vulva and urethra) including: Thinning of the epithelium (skin/ mucosa surface) Can contribute to urinary urgency and frequency Can contribute to dryness, itching, irritation of the vulvar and vaginal tissue Can contribute to pain with intercourse Can contribute to physical changes of the labia, vulva, and vagina such as: Narrowing of the vaginal opening Decreased vaginal length Loss of labial architecture Labial adhesions Pale color of vulvovaginal tissue Loss of pubic hair Allows bacteria to become adherent Results in increased risk for urinary tract infection (UTI) due to bacterial overgrowth and migration up the urethra into the bladder Change in vaginal pH (acid/ base balance) Allows for alteration / disruption of the normal bacterial flora / microbiome Results in increased risk for urinary tract infection (UTI) due to bacterial overgrowth  Treatment options: Over-the-counter lubricants (see list below). Prescription vaginal estrogen replacement. Options: Topical vaginal estrogen (estradiol) cream: (Estrace, Premarin, compounded) Apply as directed Estring vaginal ring Exchanged every 3 months (either at home or in office by provider) Vagifem vaginal tablet Inserted nightly for 2 weeks then twice a week (long term) lntrarosa vaginal suppository Vaginal DHEA: converts to estrogen in vaginal tissue without systemic effect Inserted nightly (long term) 3. Vaginal laser therapy (Mona Edwina Gram touch) Performed in 3 treatments each 6 weeks apart (available in our [place] office). Can feel like a sunburn for 3-4 days after each treatment until new skin heals in. Usually not covered by insurance. Estimated cost is $1500 for all 3 sessions.  FYI regarding prescription vaginal estrogen treatment options: - All topical vaginal estrogen replacement options are equivalent in terms of efficacy. - Topical vaginal estrogen replacement will take about 3 months to be  effective. - OK to have sex with any of the topical vaginal estrogen replacement options. - Topical vaginal estrogen replacement may sting/burn initially due to severe dryness, which will improve with ongoing treatment. - Studies have demonstrated negligible systemic absorption of low-dose intravaginal estrogen cream therefore the risk for cancer development or recurrence with this medication is minimal.  Topical vaginal estrogen cream safe to use with breast cancer history [email or web address]  Topical vaginal estrogen cream safe to use with blood clot history [email or web address]   Lubricants and Moisturizers for Treating Genitourinary Syndrome of Menopause and Vulvovaginal Atrophy Treatment Comments I Available Products   Lubricants   Water-based Ingredients: Deionized water, glycerin, propylene glycol; latex safe; rare irritation; dry out with extended sexual activity Astroglide, Good Clean Love, K-Y Jelly, Natural, Organic, Pink, Sliquid, Sylk, Yes  Oil Based Ingredients: avocado, olive, peanut, corn; latex safe; can be used with silicone products; staining; safe (unless peanut allergy); non-irritating Coconut oil, vegetable oil, vitamin E oil  Silicone-Based Ingredients: Silicone polymers; staining; typically nonirritating, long lasting; waterproof; should not be used with silicone dilators, sexual toys, or gynecologic products Astroglide X, Oceanus Ultra Pure, Pink Silicone, Pjur Eros, Replens Silky Smooth, Silicone Premium JO, SKYN, Uberlube, [employer] Based Minimize harm to sperm motility; designed for couples trying to conceive:  Astroglide TTC, Conceive Plus, PreSeed, Yes Baby  Fertility Friendly Minimize harm to sperm  motility; designed for couples trying to conceive:  Astroglide, TTC, Conceive Plus, PreSeed, Yes Baby  Vaginal Moisturizers   Vaginal Moisturizers For maintenance use 1 to 3 times weekly; can benefit women with dryness, chafing with AOL, and recurrent vaginal infections irrespective of sexual activity timing Balance Active Menopause Vaginal Moisturizing Lubricant, Canesintima Intimate Moisturizer, Replens, Rephresh, Sylk Natural Intimate Moisturizer, Yes Vaginal Moisturizer  Hybrids Properties of both water and silicone-based products (combination of a vaginal lubricant and moisturizer); Non-irritating; good option for women with allergies and sensitivities Lubrigyn, Luvena  Suppositories Hyaluronic acid to retain moisture Revaree  Vulvar Soothing Creams/Oils    Medicated Creams Pain and burn relief; Ingredients: 4% Lidocaine , Aloe Vera gel Releveum ([place])  Non-Medicated Creams For anti-itch and moisture/maintenance; Ingredients: Coconut oil, Avocado oil, Shea Butter, Olive oil, Vitamin E Vajuvenate, Vmagic  Oils for moisture/maintenance: Coconut oil, Vitamin E oil, Emu oil     Electronically signed by:  Lauretta Ponto, MSN, FNP-C, CUNP 05/10/2024 12:18 PM

## 2024-05-03 LAB — HM DIABETES EYE EXAM

## 2024-05-10 ENCOUNTER — Encounter: Payer: Self-pay | Admitting: Urology

## 2024-05-10 ENCOUNTER — Ambulatory Visit (INDEPENDENT_AMBULATORY_CARE_PROVIDER_SITE_OTHER): Admitting: Urology

## 2024-05-10 VITALS — BP 146/84 | HR 83

## 2024-05-10 DIAGNOSIS — Z09 Encounter for follow-up examination after completed treatment for conditions other than malignant neoplasm: Secondary | ICD-10-CM

## 2024-05-10 DIAGNOSIS — Z87440 Personal history of urinary (tract) infections: Secondary | ICD-10-CM

## 2024-05-10 DIAGNOSIS — N390 Urinary tract infection, site not specified: Secondary | ICD-10-CM

## 2024-05-10 LAB — URINALYSIS, ROUTINE W REFLEX MICROSCOPIC
Bilirubin, UA: NEGATIVE
Glucose, UA: NEGATIVE
Ketones, UA: NEGATIVE
Leukocytes,UA: NEGATIVE
Nitrite, UA: NEGATIVE
RBC, UA: NEGATIVE
Specific Gravity, UA: 1.02 (ref 1.005–1.030)
Urobilinogen, Ur: 0.2 mg/dL (ref 0.2–1.0)
pH, UA: 6 (ref 5.0–7.5)

## 2024-05-10 LAB — MICROSCOPIC EXAMINATION: Bacteria, UA: NONE SEEN

## 2024-05-10 LAB — BLADDER SCAN AMB NON-IMAGING: Scan Result: 0

## 2024-05-10 MED ORDER — CEPHALEXIN 250 MG PO CAPS: 250.0000 mg | ORAL_CAPSULE | Freq: Every day | ORAL | 2 refills | Status: DC

## 2024-05-10 MED ORDER — ESTRADIOL 0.1 MG/GM VA CREA: TOPICAL_CREAM | VAGINAL | 3 refills | Status: DC

## 2024-05-10 NOTE — Patient Instructions (Addendum)
 UTI prevention / management:  Difference between Urinalysis (urine dipstick test) and Urine culture:  Urinalysis (urine dipstick test): A quick office test used as an indicator to determine whether or not further testing is necessary (such as a urine culture, urine microscopy, etc.) The urinalysis cannot differentiate a true bacterial UTI or give a definitive diagnosis for the findings.  Urine culture: May be performed based on the findings of a urinalysis to evaluate for UTI. Grows out on a petri dish for 48-72 hours. Provides important information about: whether or not bacterial growth is present and if so: what the predominant bacteria is which antibiotics will work best against that bacteria That information is important so that we can diagnose and treat patients appropriately as there are other conditions which may mimic UTls which must not be missed (such as cancer, interstitial cystitis, stones, etc.). Assists us  with antibiotic stewardship to minimize patient's risk for developing antibiotic resistance (getting to a point where no antibiotics work anymore).  Options when UTI symptoms occur: 1. Call [facility] [place] and request to speak with triage nurse (phone # [phone], select option 3). In accordance with clinic guidelines the nurse will determine next steps based on patient-reported symptoms, which may include: same-day lab visit to provide urine specimen, recommendation to schedule Urology office visit appointment for further evaluation, recommendation to proceed to ER, etc. 2. Call your Primary Care Provider (PCP) office to request urgent / same-day visit. Be sure to request for urine culture to be ordered and have results faxed to Urology (fax # [phone]).  3. Go to urgent care. Be sure to request for urine culture to be ordered and have results faxed to Urology (fax # [phone]).   For bladder pain/ burning with urination: - Can take over-the-counter  Pyridium (phenazopyridine; commonly known under the AZO brand) for a few days as needed. Limit use to no more than 3 days consecutively due to risk for methemoglobinemia, liver function issues, and bone health damage with long term use of Pyridium. - Alternative: Prescription urinary analgesics (such as Uribel, Urogesic blue, Urelle, Uro-MP). Often expensive / poorly covered by insurance unfortunately.  Options / recommendations for UTI prevention: - Low dose antibiotic daily for UTI prophylaxis. - Topical vaginal estrogen for vaginal atrophy (aka Genitourinary Syndrome of Menopause (GSM)). - Adequate daily fluid intake to flush out the urinary tract. - Go to the bathroom to urinate every 4-6 hours while awake to minimize urinary stasis / bacterial overgrowth in the bladder. - Proanthocyanidin (PAC) supplement 36 mg daily; must be soluble (insoluble form of PAC will be ineffective). Recommended brand: Ellura. This is an over-the-counter supplement (often must be found/ purchased online) supplement derived from cranberries with concentrated active component: Proanthocyanidin (PAC) 36 mg daily. Decreases bacterial adherence to bladder lining.  - D-mannose powder (2 grams daily). This is an over-the-counter supplement which decreases bacterial adherence to bladder lining (it is a sugar that inhibits bacterial adherence to urothelial cells by binding to the pili of enteric bacteria). Take as per manufacturer recommendation. Can be used as an alternative or in addition to the concentrated cranberry supplement.  - Vitamin C supplement to acidify urine to minimize bacterial growth.  - Probiotic to maintain healthy vaginal microbiome to suppress bacteria at urethral opening. Brand recommendations: Estill Hemming (includes probiotic & D-mannose ), Feminine Balance (highest concentration of lactobacillus) or Hyperbiotic Pro 15.  Note for patients with diabetes:  - Be aware that D-mannose contains sugar.    Vaginal  atrophy I  Genitourinary syndrome of menopause (GSM):  What it is: Changes in the vaginal environment (including the vulva and urethra) including: Thinning of the epithelium (skin/ mucosa surface) Can contribute to urinary urgency and frequency Can contribute to dryness, itching, irritation of the vulvar and vaginal tissue Can contribute to pain with intercourse Can contribute to physical changes of the labia, vulva, and vagina such as: Narrowing of the vaginal opening Decreased vaginal length Loss of labial architecture Labial adhesions Pale color of vulvovaginal tissue Loss of pubic hair Allows bacteria to become adherent Results in increased risk for urinary tract infection (UTI) due to bacterial overgrowth and migration up the urethra into the bladder Change in vaginal pH (acid/ base balance) Allows for alteration / disruption of the normal bacterial flora / microbiome Results in increased risk for urinary tract infection (UTI) due to bacterial overgrowth  Treatment options: Over-the-counter lubricants (see list below). Prescription vaginal estrogen replacement. Options: Topical vaginal estrogen (estradiol) cream: (Estrace, Premarin, compounded) Apply as directed Estring vaginal ring Exchanged every 3 months (either at home or in office by provider) Vagifem vaginal tablet Inserted nightly for 2 weeks then twice a week (long term) lntrarosa vaginal suppository Vaginal DHEA: converts to estrogen in vaginal tissue without systemic effect Inserted nightly (long term) 3. Vaginal laser therapy (Mona Edwina Gram touch) Performed in 3 treatments each 6 weeks apart (available in our [place] office). Can feel like a sunburn for 3-4 days after each treatment until new skin heals in. Usually not covered by insurance. Estimated cost is $1500 for all 3 sessions.  FYI regarding prescription vaginal estrogen treatment options: - All topical vaginal estrogen replacement options are equivalent in  terms of efficacy. - Topical vaginal estrogen replacement will take about 3 months to be effective. - OK to have sex with any of the topical vaginal estrogen replacement options. - Topical vaginal estrogen replacement may sting/burn initially due to severe dryness, which will improve with ongoing treatment. - Studies have demonstrated negligible systemic absorption of low-dose intravaginal estrogen cream therefore the risk for cancer development or recurrence with this medication is minimal.  Topical vaginal estrogen cream safe to use with breast cancer history [email or web address]  Topical vaginal estrogen cream safe to use with blood clot history [email or web address]   Lubricants and Moisturizers for Treating Genitourinary Syndrome of Menopause and Vulvovaginal Atrophy Treatment Comments I Available Products   Lubricants   Water-based Ingredients: Deionized water, glycerin, propylene glycol; latex safe; rare irritation; dry out with extended sexual activity Astroglide, Good Clean Love, K-Y Jelly, Natural, Organic, Pink, Sliquid, Sylk, Yes    Oil Based Ingredients: avocado, olive, peanut, corn; latex safe; can be used with silicone products; staining; safe (unless peanut allergy); non-irritating Coconut oil, vegetable oil, vitamin E oil  Silicone-Based Ingredients: Silicone polymers; staining; typically nonirritating, long lasting; waterproof; should not be used with silicone dilators, sexual toys, or gynecologic products Astroglide X, Oceanus Ultra Pure, Pink Silicone, Pjur Eros, Replens Silky Smooth, Silicone Premium JO, SKYN, Uberlube, [employer] Based Minimize harm to sperm motility; designed for couples trying to conceive:   Astroglide TTC, Conceive Plus, PreSeed, Yes Baby  Fertility Friendly Minimize harm to sperm motility; designed for couples trying to conceive:  Astroglide, TTC, Conceive Plus, PreSeed, Yes Baby  Vaginal Moisturizers   Vaginal Moisturizers For maintenance use 1 to 3 times weekly; can benefit women with dryness, chafing with AOL, and recurrent vaginal infections irrespective of sexual activity timing Balance Active Menopause Vaginal Moisturizing Lubricant, Canesintima Intimate Moisturizer, Replens, Rephresh, Sylk Natural  Intimate Moisturizer, Yes Vaginal Moisturizer  Hybrids Properties of both water and silicone-based products (combination of a vaginal lubricant and moisturizer); Non-irritating; good option for women with allergies and sensitivities Lubrigyn, Luvena  Suppositories Hyaluronic acid to retain moisture Revaree  Vulvar Soothing Creams/Oils    Medicated Creams Pain and burn relief; Ingredients: 4% Lidocaine , Aloe Vera gel Releveum ([employer])  Non-Medicated Creams For anti-itch and moisture/maintenance; Ingredients: Coconut oil, Avocado oil, Shea Butter, Olive oil, Vitamin E Vajuvenate, Vmagic  Oils for moisture/maintenance: Coconut oil, Vitamin E oil, Emu oil

## 2024-05-21 ENCOUNTER — Other Ambulatory Visit: Payer: Self-pay | Admitting: Family Medicine

## 2024-05-21 DIAGNOSIS — T7840XA Allergy, unspecified, initial encounter: Secondary | ICD-10-CM

## 2024-05-21 DIAGNOSIS — J45909 Unspecified asthma, uncomplicated: Secondary | ICD-10-CM

## 2024-05-25 ENCOUNTER — Telehealth: Payer: Self-pay

## 2024-05-25 ENCOUNTER — Ambulatory Visit: Admitting: Urology

## 2024-05-25 NOTE — Telephone Encounter (Signed)
 Copied from CRM [id number]. Topic: Clinical - Medication Question >> May 25, 2024  3:46 PM Donna BRAVO wrote: Reason for CRM: [place] with [facility] [phone] asking if patient     has history of taking insulin  for the past 6 months. Glucose Blood (BLOOD GLUCOSE TEST STRIPS) STRP

## 2024-05-25 NOTE — Telephone Encounter (Signed)
 Spoke with [place] and informed him that pt has never been on insulin .

## 2024-06-01 ENCOUNTER — Other Ambulatory Visit (HOSPITAL_COMMUNITY): Payer: Self-pay

## 2024-06-02 ENCOUNTER — Telehealth: Payer: Self-pay | Admitting: Pharmacy Technician

## 2024-06-02 ENCOUNTER — Other Ambulatory Visit (HOSPITAL_COMMUNITY): Payer: Self-pay

## 2024-06-02 NOTE — Telephone Encounter (Signed)
 Pharmacy Patient Advocate Encounter  Received notification from [facility] Medicaid that Prior Authorization for Mounjaro  5MG /0.5ML auto-injectors has been APPROVED from 06/02/24 to 11/24/2098. Ran test claim, Copay is $241.33. This test claim was processed through [facility]- copay amounts may vary at other pharmacies due to pharmacy/plan contracts, or as the patient moves through the different stages of their insurance plan.   PA #/Case ID/Reference #: [id number]

## 2024-06-02 NOTE — Telephone Encounter (Signed)
 Pharmacy Patient Advocate Encounter   Received notification from CoverMyMeds that prior authorization for Mounjaro  5MG /0.5ML auto-injectors is required/requested.   Insurance verification completed.   The patient is insured through [facility] .   Per test claim: PA required; PA submitted to above mentioned insurance via CoverMyMeds Key/confirmation #/EOC [id number] Status is pending

## 2024-06-30 ENCOUNTER — Emergency Department (HOSPITAL_COMMUNITY)

## 2024-06-30 ENCOUNTER — Ambulatory Visit: Payer: Self-pay

## 2024-06-30 ENCOUNTER — Emergency Department (HOSPITAL_COMMUNITY)
Admission: EM | Admit: 2024-06-30 | Discharge: 2024-06-30 | Disposition: A | Discharge status: Home / Self Care | Attending: Emergency Medicine | Admitting: Emergency Medicine

## 2024-06-30 ENCOUNTER — Other Ambulatory Visit: Payer: Self-pay

## 2024-06-30 ENCOUNTER — Encounter (HOSPITAL_COMMUNITY): Payer: Self-pay

## 2024-06-30 DIAGNOSIS — R519 Headache, unspecified: Secondary | ICD-10-CM | POA: Diagnosis present

## 2024-06-30 DIAGNOSIS — Z7982 Long term (current) use of aspirin: Secondary | ICD-10-CM | POA: Insufficient documentation

## 2024-06-30 LAB — BASIC METABOLIC PANEL WITH GFR
Anion gap: 7 (ref 5–15)
BUN: 29 mg/dL — ABNORMAL HIGH (ref 8–23)
CO2: 25 mmol/L (ref 22–32)
Calcium: 9.7 mg/dL (ref 8.9–10.3)
Chloride: 108 mmol/L (ref 98–111)
Creatinine, Ser: 1.6 mg/dL — ABNORMAL HIGH (ref 0.44–1.00)
GFR, Estimated: 35 mL/min — ABNORMAL LOW (ref >60)
Glucose, Bld: 86 mg/dL (ref 70–99)
Potassium: 5.1 mmol/L (ref 3.5–5.1)
Sodium: 140 mmol/L (ref 135–145)

## 2024-06-30 LAB — CBC WITH DIFFERENTIAL/PLATELET
Abs Immature Granulocytes: 0.02 K/uL (ref 0.00–0.07)
Basophils Absolute: 0.1 K/uL (ref 0.0–0.1)
Basophils Relative: 1 %
Eosinophils Absolute: 0.3 K/uL (ref 0.0–0.5)
Eosinophils Relative: 3 %
HCT: 40.9 % (ref 36.0–46.0)
Hemoglobin: 13.3 g/dL (ref 12.0–15.0)
Immature Granulocytes: 0 %
Lymphocytes Relative: 26 %
Lymphs Abs: 1.9 K/uL (ref 0.7–4.0)
MCH: 29.8 pg (ref 26.0–34.0)
MCHC: 32.5 g/dL (ref 30.0–36.0)
MCV: 91.5 fL (ref 80.0–100.0)
Monocytes Absolute: 0.6 K/uL (ref 0.1–1.0)
Monocytes Relative: 8 %
Neutro Abs: 4.5 K/uL (ref 1.7–7.7)
Neutrophils Relative %: 62 %
Platelets: 275 K/uL (ref 150–400)
RBC: 4.47 MIL/uL (ref 3.87–5.11)
RDW: 12.5 % (ref 11.5–15.5)
WBC: 7.3 K/uL (ref 4.0–10.5)
nRBC: 0 % (ref 0.0–0.2)

## 2024-06-30 MED ORDER — DEXAMETHASONE 4 MG PO TABS
10.0000 mg | ORAL_TABLET | Freq: Once | ORAL | Status: AC
  Administered 2024-06-30: 10 mg via ORAL
  Filled 2024-06-30: qty 3

## 2024-06-30 MED ORDER — DIPHENHYDRAMINE HCL 50 MG/ML IJ SOLN
25.0000 mg | Freq: Once | INTRAMUSCULAR | Status: AC
  Administered 2024-06-30: 25 mg via INTRAVENOUS
  Filled 2024-06-30: qty 1

## 2024-06-30 MED ORDER — PROCHLORPERAZINE EDISYLATE 10 MG/2ML IJ SOLN
10.0000 mg | Freq: Once | INTRAMUSCULAR | Status: AC
  Administered 2024-06-30: 10 mg via INTRAVENOUS
  Filled 2024-06-30: qty 2

## 2024-06-30 MED ORDER — SODIUM CHLORIDE 0.9 % IV BOLUS
1000.0000 mL | Freq: Once | INTRAVENOUS | Status: AC
  Administered 2024-06-30: 1000 mL via INTRAVENOUS

## 2024-06-30 NOTE — Telephone Encounter (Signed)
 FYI Only or Action Required?: FYI only for provider.  Patient was last seen in primary care on 03/30/2024 by Duanne Butler DASEN, MD.  Called Nurse Triage reporting Migraine.  Symptoms began today but has had migraines daily x 1 month  Interventions attempted: OTC medications: Advil  migraine.  Symptoms are: rapidly worsening.  Triage Disposition: Go to ED Now (or PCP Triage), Go to ED Now (Notify PCP) (overriding See HCP Within 4 Hours (Or PCP Triage))  Patient/caregiver understands and will follow disposition?: Yes           Reason for Disposition  [1] SEVERE headache (e.g., excruciating) AND [2] not improved after 2 hours of pain medicine  Patient sounds very sick or weak to the triager  Answer Assessment - Initial Assessment Questions 1. LOCATION: Where does it hurt?      Behind left ear  2. ONSET: When did the headache start? (e.g., minutes, hours, days)      1 month  today  3. PATTERN: Does the pain come and go, or has it been constant since it started?     Comes and goes  4. SEVERITY: How bad is the pain? and What does it keep you from doing?  (e.g., Scale 1-10; mild, moderate, or severe)     9/10 5. RECURRENT SYMPTOM: Have you ever had headaches before? If Yes, ask: When was the last time? and What happened that time?      Yes- all month- took Tylenol  and used son's migraine (advil  migraine) 6. CAUSE: What do you think is causing the headache?     migraines 7. MIGRAINE: Have you been diagnosed with migraine headaches? If Yes, ask: Is this headache similar?      no 8. HEAD INJURY: Has there been any recent injury to your head?      no 9. OTHER SYMPTOMS: Do you have any other symptoms? (e.g., fever, stiff neck, eye pain, sore throat, cold symptoms)     Sore throat- 2 days- began vomiting during triage call  Protocols used: Headache-A-AH

## 2024-06-30 NOTE — ED Triage Notes (Signed)
 Pt reports migraine for a few months. Unable to keep meds down today. PCP aware that she has been having them intermittently for a few months.

## 2024-06-30 NOTE — ED Triage Notes (Signed)
 Pt arrives POV c/o a migraine that has been going on for a couple days. PCP sent her here for a migraine cocktail since nothing at home works. Pt also complains of nausea and vomiting today.

## 2024-06-30 NOTE — ED Provider Notes (Signed)
 [place] EMERGENC[facility] DEPARTMENT AT [facility] Provider Note   CSN: [id number] Arrival date & time: 06/30/24  1508     Patient presents with: Migraine   Caitlyn [name] is a 68 y.o. female.   68 yo F with a chief complaint of a headache.  L sided, occurs a couple times a week.  Going on for a few months.  Has had a bit of trouble standing up.  Has to take a bit to get up.  Nothing seems to make these come on.  Seem to occur spontaneously.  She usually takes some Tylenol  and lays down and they get better.  If that does not work then she takes some Advil .  She tried that today but without improvement and so called her doctor.  They encouraged her to come here to get a headache cocktail.  She denies trauma to the head or neck.  Denies one-sided numbness or weakness or difficulty speech or swallowing.  She has had headaches like this but it has been some years since she has had 1.     Migraine       Prior to Admission medications   Medication Sig Start Date End Date Taking? Authorizing Provider  albuterol  (VENTOLIN  HFA) 108 (90 Base) MCG/ACT inhaler Inhale 2 puffs into the lungs every 4 (four) hours as needed for wheezing or shortness of breath. 09/02/22   Kayla Jeoffrey RAMAN, FNP  amLODipine  (NORVASC ) 10 MG tablet Take 1 tablet (10 mg total) by mouth daily. Patient not taking: Reported on 05/10/2024 09/12/23   Duanne Butler DASEN, MD  aspirin  EC 81 MG tablet Take 1 tablet (81 mg total) by mouth daily. Swallow whole. 06/12/21   Whitfield Raisin, NP  cephALEXin  (KEFLEX ) 250 MG capsule Take 1 capsule (250 mg total) by mouth daily. 05/10/24   Larocco, Lauraine BROCKS, FNP  COSENTYX SENSOREADY 300 DOSE 150 MG/ML SOAJ Inject 150 mg into the muscle every 30 (thirty) days. 08/21/17   [provider]  estradiol  (ESTRACE ) 0.1 MG/GM vaginal cream Discard plastic applicator. Insert a blueberry size amount (approximately 1 gram) of cream on fingertip inside vagina at bedtime every night for 1 week then  every other night. For long term use. 05/10/24   Gerldine Lauraine BROCKS, FNP  fexofenadine  (ALLEGRA ) 180 MG tablet Take 180 mg by mouth at bedtime.    [provider]  Glucose Blood (BLOOD GLUCOSE TEST STRIPS) STRP Use as directed to monitor FSBS 4x daily. Dx: E11.65. Patient not taking: Reported on 05/10/2024 04/26/24   Duanne Butler DASEN, MD  montelukast  (SINGULAIR ) 10 MG tablet TAKE 1 TABLET(10 MG) BY MOUTH AT BEDTIME 05/21/24   Duanne Butler DASEN, MD  rosuvastatin  (CRESTOR ) 20 MG tablet Take 1 tablet (20 mg total) by mouth daily. 03/30/24   Duanne Butler DASEN, MD  tirzepatide  (MOUNJARO ) 5 MG/0.5ML Pen Inject 5 mg into the skin once a week. Patient not taking: Reported on 05/10/2024 02/09/24   Duanne Butler DASEN, MD  venlafaxine  XR (EFFEXOR -XR) 150 MG 24 hr capsule TAKE 1 CAPSULE BY MOUTH  EVERY DAY 03/30/24   Duanne Butler DASEN, MD    Allergies: Erythromycin, Hydrochlorothiazide , Pollen extract, Valsartan , and Macrolides and ketolides    Review of Systems  Updated Vital Signs BP (!) 182/95   Pulse 72   Temp 98.2 F (36.8 C) (Oral)   Resp 18   Ht 5' 6 (1.676 m)   Wt 93 kg   SpO2 100%   BMI 33.09 kg/m   Physical  Exam Vitals and nursing note reviewed.  Constitutional:      General: She is not in acute distress.    Appearance: She is well-developed. She is not diaphoretic.  HENT:     Head: Normocephalic and atraumatic.  Eyes:     Pupils: Pupils are equal, round, and reactive to light.  Cardiovascular:     Rate and Rhythm: Normal rate and regular rhythm.     Heart sounds: No murmur heard.    No friction rub. No gallop.  Pulmonary:     Effort: Pulmonary effort is normal.     Breath sounds: No wheezing or rales.  Abdominal:     General: There is no distension.     Palpations: Abdomen is soft.     Tenderness: There is no abdominal tenderness.  Musculoskeletal:        General: No tenderness.     Cervical back: Normal range of motion and neck supple.  Skin:    General: Skin is warm  and dry.  Neurological:     Mental Status: She is alert and oriented to person, place, and time.     GCS: GCS eye subscore is 4. GCS verbal subscore is 5. GCS motor subscore is 6.     Cranial Nerves: Cranial nerves 2-12 are intact.     Sensory: Sensation is intact.     Motor: Motor function is intact.     Coordination: Coordination is intact.     Comments: Benign neuro exam.    Psychiatric:        Behavior: Behavior normal.     (all labs ordered are listed, but only abnormal results are displayed) Labs Reviewed  BASIC METABOLIC PANEL WITH GFR - Abnormal; Notable for the following components:      Result Value   BUN 29 (*)    Creatinine, Ser 1.60 (*)    GFR, Estimated 35 (*)    All other components within normal limits  CBC WITH DIFFERENTIAL/PLATELET    EKG: None  Radiology: CT Head Wo Contrast Result Date: 06/30/2024 EXAM: CT HEAD WITHOUT CONTRAST 06/30/2024 03:41:53 PM TECHNIQUE: CT of the head was performed without the administration of intravenous contrast. Automated exposure control, iterative reconstruction, and/or weight based adjustment of the mA/kV was utilized to reduce the radiation dose to as low as reasonably achievable. COMPARISON: MRI head dated 03/14/2021. CLINICAL HISTORY: Headache, classic migraine. Pt arrives POV c/o a migraine that has been going on for a couple days. PCP sent her here for a migraine cocktail since nothing at home works. Pt also complains of nausea and vomiting today. FINDINGS: BRAIN AND VENTRICLES: No acute hemorrhage. Gray-white differentiation is preserved. No hydrocephalus. No extra-axial collection. No mass effect or midline shift. Nonspecific hypoattenuation in the periventricular and subcortical white matter, most likely representing chronic small vessel disease. ORBITS: Bilateral lens replacement. SINUSES: No acute abnormality. SOFT TISSUES AND SKULL: No acute soft tissue abnormality. No skull fracture. Atherosclerosis of the skull base  involving the carotid siphons. IMPRESSION: 1. No acute intracranial abnormality related to the clinical history of headache and classic migraine. 2. Nonspecific hypoattenuation in the periventricular and subcortical white matter, most likely representing chronic small vessel disease. Electronically signed by: Donnice Mania MD 06/30/2024 03:50 PM EDT RP Workstation: [id number]     Procedures   Medications Ordered in the ED  sodium chloride  0.9 % bolus 1,000 mL (1,000 mLs Intravenous New Bag/Given 06/30/24 1635)  prochlorperazine  (COMPAZINE ) injection 10 mg (10 mg Intravenous Given 06/30/24 1637)  diphenhydrAMINE  (  BENADRYL ) injection 25 mg (25 mg Intravenous Given 06/30/24 1637)  dexamethasone  (DECADRON ) tablet 10 mg (10 mg Oral Given 06/30/24 1638)                                    Medical Decision Making Amount and/or Complexity of Data Reviewed Labs: ordered. Radiology: ordered.  Risk Prescription drug management.   68 yo F with a cc of a headache.  Off and on for the past two months.  Hx of headaches in the past, but occurred many years ago.    Benign exam.    Will give a headache cocktail here.  Patient had a CT ordered through the triage process, no obvious acute intracranial process.  She tells me that her family has had some issues with decreased blood flow to the back part of her brain.  She is not sure exactly what this means and has trouble describing it.  She has had some vomiting today.  Will give a bolus of IV fluids.  Reassess.  No leukocytosis no anemia, no significant electrolyte abnormalities.  Renal functions at baseline.  Patient reassessed and feeling much better.  Will discharge home.  PCP follow-up.  5:41 PM:  I have discussed the diagnosis/risks/treatment options with the patient and family.  Evaluation and diagnostic testing in the emergency department does not suggest an emergent condition requiring admission or immediate intervention beyond what has been  performed at this time.  They will follow up with PCP, neuro. We also discussed returning to the ED immediately if new or worsening sx occur. We discussed the sx which are most concerning (e.g., sudden worsening pain, fever, inability to tolerate by mouth, stroke s/sx) that necessitate immediate return. Medications administered to the patient during their visit and any new prescriptions provided to the patient are listed below.  Medications given during this visit Medications  sodium chloride  0.9 % bolus 1,000 mL (1,000 mLs Intravenous New Bag/Given 06/30/24 1635)  prochlorperazine  (COMPAZINE ) injection 10 mg (10 mg Intravenous Given 06/30/24 1637)  diphenhydrAMINE  (BENADRYL ) injection 25 mg (25 mg Intravenous Given 06/30/24 1637)  dexamethasone  (DECADRON ) tablet 10 mg (10 mg Oral Given 06/30/24 1638)     The patient appears reasonably screen and/or stabilized for discharge and I doubt any other medical condition or other [facility] requiring further screening, evaluation, or treatment in the ED at this time prior to discharge.       Final diagnoses:  Left-sided headache    ED Discharge Orders          Ordered    Ambulatory referral to Neurology       Comments: Migraines?   06/30/24 1740               Emil Share, DO 06/30/24 [id number]

## 2024-06-30 NOTE — Discharge Instructions (Signed)
 Please follow-up with your family doctor in the office.  I placed a referral for the neurologist to call you to try and set up an appointment.  As we discussed please return for rapid worsening one-sided numbness or weakness difficulty speech or swallowing or if you develop a fever.

## 2024-07-06 ENCOUNTER — Ambulatory Visit: Admitting: Family Medicine

## 2024-07-06 ENCOUNTER — Encounter: Payer: Self-pay | Admitting: Family Medicine

## 2024-07-06 VITALS — BP 152/92 | HR 78 | Temp 98.6°F | Ht 66.0 in | Wt 229.4 lb

## 2024-07-06 DIAGNOSIS — I10 Essential (primary) hypertension: Secondary | ICD-10-CM

## 2024-07-06 DIAGNOSIS — N1832 Chronic kidney disease, stage 3b: Secondary | ICD-10-CM

## 2024-07-06 DIAGNOSIS — R0989 Other specified symptoms and signs involving the circulatory and respiratory systems: Secondary | ICD-10-CM

## 2024-07-06 DIAGNOSIS — E1169 Type 2 diabetes mellitus with other specified complication: Secondary | ICD-10-CM | POA: Diagnosis not present

## 2024-07-06 MED ORDER — AMLODIPINE BESYLATE 5 MG PO TABS: 5.0000 mg | ORAL_TABLET | Freq: Every day | ORAL | 3 refills | Status: AC

## 2024-07-06 MED ORDER — PROMETHAZINE HCL 25 MG PO TABS: 25.0000 mg | ORAL_TABLET | Freq: Three times a day (TID) | ORAL | 0 refills | Status: DC | PRN

## 2024-07-06 MED ORDER — OZEMPIC (0.25 OR 0.5 MG/DOSE) 2 MG/3ML ~~LOC~~ SOPN
0.2500 mg | PEN_INJECTOR | SUBCUTANEOUS | 2 refills | Status: DC
  Filled 2024-07-12: qty 3, 56d supply, fill #0
  Filled 2024-07-29: qty 3, 56d supply, fill #1

## 2024-07-06 NOTE — Progress Notes (Signed)
 Subjective:    Patient ID: Caitlyn [name], female    DOB: 1956-09-21, 68 y.o.   MRN: [id number] In May of this year, we discontinued the patient's amlodipine  due to hypotension dizziness.  However, Is continue Mounjaro  that she was using for diabetes due to nausea and vomiting. Wt Readings from Last 3 Encounters:  07/06/24 229 lb 6.4 oz (104.1 kg)  06/30/24 205 lb 0.4 oz (93 kg)  03/30/24 205 lb (93 kg)   Since stopping Mounjaro , the patient has gained almost 24 pounds.  Her sugars are starting to rise.  Her blood pressure starting to rise as well.  Over the last month, she is consistently seeing her blood pressure between 130 and 160 and diastolic blood pressures between 90 and 110.  She recently had to go to the hospital due to severe headache and was diagnosed with migraine.  CT scan of the brain at that point showed chronic microvascular ischemic changes but no acute disease.  The headache has subsided however on exam today, I feel like I can hear a bruit in the left carotid artery.  Past Medical History:  Diagnosis Date   Asthma    Complication of anesthesia    24 yrs ago post tubal-- pulm. edema  (no problems post bowel surg. 2009)   Depression    Diabetes mellitus    oral med   Diverticulitis hx  -- w/ perf. bowel   s/p resection 2009   Hypertension    Psoriasis    Reflux occasional   watches diet    Past Surgical History:  Procedure Laterality Date   BOWEL RESECTION  2009   diverticulitis w/ perf. bowel   COLON SURGERY N/A    [employer] 11/28/2020   HYSTEROSCOPY  10/09/2011   Procedure: HYSTEROSCOPY;  Surgeon: Charlie CHRISTELLA Croak;  Location: [facility];  Service: Gynecology;;  HYSTEROSCOPY D & C   TUBAL LIGATION  24 yrs ago    Current Outpatient Medications on File Prior to Visit  Medication Sig Dispense Refill   albuterol  (VENTOLIN  HFA) 108 (90 Base) MCG/ACT inhaler Inhale 2 puffs into the lungs every 4 (four) hours as needed for wheezing or shortness of  breath. 8 g 2   aspirin  EC 81 MG tablet Take 1 tablet (81 mg total) by mouth daily. Swallow whole. 30 tablet 11   cephALEXin  (KEFLEX ) 250 MG capsule Take 1 capsule (250 mg total) by mouth daily. 30 capsule 2   COSENTYX SENSOREADY 300 DOSE 150 MG/ML SOAJ Inject 150 mg into the muscle every 30 (thirty) days.     estradiol  (ESTRACE ) 0.1 MG/GM vaginal cream Discard plastic applicator. Insert a blueberry size amount (approximately 1 gram) of cream on fingertip inside vagina at bedtime every night for 1 week then every other night. For long term use. 42.5 g 3   fexofenadine  (ALLEGRA ) 180 MG tablet Take 180 mg by mouth at bedtime.     Glucose Blood (BLOOD GLUCOSE TEST STRIPS) STRP Use as directed to monitor FSBS 4x daily. Dx: E11.65. (Patient not taking: Reported on 05/10/2024) 100 strip 2   montelukast  (SINGULAIR ) 10 MG tablet TAKE 1 TABLET(10 MG) BY MOUTH AT BEDTIME 90 tablet 3   rosuvastatin  (CRESTOR ) 20 MG tablet Take 1 tablet (20 mg total) by mouth daily. 90 tablet 3   venlafaxine  XR (EFFEXOR -XR) 150 MG 24 hr capsule TAKE 1 CAPSULE BY MOUTH  EVERY DAY 90 capsule 3   No current facility-administered medications on file prior to visit.  Allergies  Allergen Reactions   Erythromycin Other (See Comments)    Gi upset/ cramps Other reaction(s): Unknown   Hydrochlorothiazide      CAUSED GOUT  Advised to drink plenty of fluids to avoid crystallization in joints.    Pollen Extract    Valsartan      hyperkalemia   Macrolides And Ketolides Other (See Comments)    unknown   Social History   Socioeconomic History   Marital status: Married    Spouse name: Evelia Waskey   Number of children: Not on file   Years of education: 12   Highest education level: Bachelor's degree (e.g., BA, AB, BS)  Occupational History   Occupation: accountant  Tobacco Use   Smoking status: Never    Passive exposure: Never   Smokeless tobacco: Never  Vaping Use   Vaping status: Never Used  Substance and Sexual  Activity   Alcohol use: No   Drug use: No   Sexual activity: Yes    Partners: Male  Other Topics Concern   Not on file  Social History Narrative   Lives with her husband   1-2 cups daily of Caffeine    Social Drivers of Health   Financial Resource Strain: Patient Declined (03/29/2024)   Overall Financial Resource Strain (CARDIA)    Difficulty of Paying Living Expenses: Patient declined  Food Insecurity: Patient Declined (03/29/2024)   Hunger Vital Sign    Worried About Running Out of Food in the Last Year: Patient declined    Ran Out of Food in the Last Year: Patient declined  Transportation Needs: Patient Declined (03/29/2024)   PRAPARE - Administrator, Civil Service (Medical): Patient declined    Lack of Transportation (Non-Medical): Patient declined  Physical Activity: Inactive (10/28/2023)   Exercise Vital Sign    Days of Exercise per Week: 0 days    Minutes of Exercise per Session: 0 min  Stress: No Stress Concern Present (10/28/2023)   [employer] of Occupational Health - Occupational Stress Questionnaire    Feeling of Stress : Only a little  Social Connections: Unknown (03/29/2024)   Social Connection and Isolation Panel    Frequency of Communication with Friends and Family: Patient declined    Frequency of Social Gatherings with Friends and Family: Patient declined    Attends Religious Services: Patient declined    Database administrator or Organizations: Patient declined    Attends Engineer, structural: More than 4 times per year    Marital Status: Patient declined  Intimate Partner Violence: Not At Risk (10/28/2023)   Humiliation, Afraid, Rape, and Kick questionnaire    Fear of Current or Ex-Partner: No    Emotionally Abused: No    Physically Abused: No    Sexually Abused: No      Review of Systems     Objective:   Physical Exam Vitals reviewed.  Constitutional:      General: She is not in acute distress.    Appearance: Normal  appearance. She is obese. She is not ill-appearing or toxic-appearing.  Neck:     Vascular: Carotid bruit present.  Cardiovascular:     Rate and Rhythm: Normal rate and regular rhythm.     Heart sounds: Normal heart sounds. No murmur heard.    No friction rub. No gallop.  Pulmonary:     Effort: Pulmonary effort is normal. No respiratory distress.     Breath sounds: No stridor. No wheezing or rales.  Abdominal:  General: Bowel sounds are normal. There is no distension.     Palpations: Abdomen is soft.     Tenderness: There is no abdominal tenderness. There is no guarding or rebound.  Musculoskeletal:     Left wrist: No crepitus.  Neurological:     Mental Status: She is alert.        Assessment & Plan:  Bruit - Plan: US  Carotid Duplex Bilateral  Type 2 diabetes mellitus with other specified complication, unspecified whether long term insulin  use (HCC)  Stage 3b chronic kidney disease (HCC)  Benign essential HTN Due to the bruit and the chronic microvascular changes on the CT scan, I have recommended carotid Dopplers to evaluate further.  Meanwhile start aspirin  81 mg daily.  Resume amlodipine  5 mg a day and recheck blood pressure in 2 weeks.  If not less than 140/90, I would recommend increasing amlodipine  to 10 mg a day.  Goal is 130/80 or less due to her chronic kidney disease.  Patient is due to recheck her A1c in 3 months.  Patient is no longer able to tolerate Mounjaro  but her weight is rapidly increasing.  Therefore we will try switching the patient to Ozempic  0.25 mg subcu weekly and uptitrating as tolerated on a monthly basis to manage her diabetes.  Last A1c on Mounjaro  was outstanding at 5.9 however the patient has gained almost 24 pounds since that time and is no longer on medication

## 2024-07-07 ENCOUNTER — Other Ambulatory Visit (HOSPITAL_COMMUNITY): Payer: Self-pay

## 2024-07-07 ENCOUNTER — Telehealth: Payer: Self-pay | Admitting: Pharmacy Technician

## 2024-07-07 NOTE — Telephone Encounter (Signed)
 Pharmacy Patient Advocate Encounter   Received notification from Onbase that prior authorization for Promethazine  HCl 25MG  tablets is required/requested.   Insurance verification completed.   The patient is insured through [facility] MEDICARE.   Per test claim:  Ondansetron  is preferred by the insurance.  If suggested medication is appropriate, Please send in a new RX and discontinue this one. If not, please advise as to why it's not appropriate so that we may request a Prior Authorization. Please note, some preferred medications may still require a PA.  If the suggested medications have not been trialed and there are no contraindications to their use, the PA will not be submitted, as it will not be approved.

## 2024-07-07 NOTE — Telephone Encounter (Signed)
 Pharmacy Patient Advocate Encounter  Received notification from [facility] that Prior Authorization for OZEMPIC  (0.25 OR 0.5 MG/DOSE) 2 MG/3ML [place] SOPN  has been APPROVED from 07/07/24 to 11/24/2098. Ran test claim, Copay is $222.96. This test claim was processed through [facility]- copay amounts may vary at other pharmacies due to pharmacy/plan contracts, or as the patient moves through the different stages of their insurance plan.   PA #/Case ID/Reference #: [id number]

## 2024-07-07 NOTE — Telephone Encounter (Signed)
 Pharmacy Patient Advocate Encounter   Received notification from Onbase that prior authorization for OZEMPIC  (0.25 OR 0.5 MG/DOSE) 2 MG/3ML [place] SOPN  is required/requested.   Insurance verification completed.   The patient is insured through [facility] MEDICARE .   Per test claim: PA required; PA submitted to above mentioned insurance via Latent Key/confirmation #/EOC [id number] Status is pending

## 2024-07-08 ENCOUNTER — Encounter: Payer: Self-pay | Admitting: Family Medicine

## 2024-07-08 ENCOUNTER — Ambulatory Visit
Admission: RE | Admit: 2024-07-08 | Discharge: 2024-07-08 | Disposition: A | Discharge status: Home / Self Care | Source: Ambulatory Visit | Attending: Family Medicine | Admitting: Family Medicine

## 2024-07-08 ENCOUNTER — Other Ambulatory Visit: Payer: Self-pay | Admitting: Family Medicine

## 2024-07-08 DIAGNOSIS — R0989 Other specified symptoms and signs involving the circulatory and respiratory systems: Secondary | ICD-10-CM

## 2024-07-08 MED ORDER — ONDANSETRON HCL 4 MG PO TABS: 4.0000 mg | ORAL_TABLET | Freq: Three times a day (TID) | ORAL | 0 refills | Status: AC | PRN

## 2024-07-12 ENCOUNTER — Other Ambulatory Visit (HOSPITAL_COMMUNITY): Payer: Self-pay

## 2024-07-12 ENCOUNTER — Ambulatory Visit: Payer: Self-pay | Admitting: Family Medicine

## 2024-07-12 MED ORDER — ROSUVASTATIN CALCIUM 20 MG PO TABS: 20.0000 mg | ORAL_TABLET | Freq: Every day | ORAL | 3 refills | Status: DC

## 2024-07-12 MED ORDER — PROMETHAZINE HCL 25 MG PO TABS: 25.0000 mg | ORAL_TABLET | Freq: Three times a day (TID) | ORAL | 0 refills | Status: DC | PRN

## 2024-07-12 MED ORDER — AMLODIPINE BESYLATE 5 MG PO TABS
5.0000 mg | ORAL_TABLET | Freq: Every day | ORAL | 3 refills | Status: DC
  Filled 2024-07-21: qty 90, 90d supply, fill #0

## 2024-07-12 MED ORDER — MONTELUKAST SODIUM 10 MG PO TABS: 10.0000 mg | ORAL_TABLET | Freq: Every day | ORAL | 3 refills | Status: DC

## 2024-07-12 MED ORDER — AMLODIPINE BESYLATE 10 MG PO TABS: 10.0000 mg | ORAL_TABLET | Freq: Every day | ORAL | 3 refills | Status: DC

## 2024-07-12 MED ORDER — GLUCOSE BLOOD VI STRP: 1.0000 | ORAL_STRIP | Freq: Four times a day (QID) | 2 refills | Status: DC

## 2024-07-12 MED ORDER — MONTELUKAST SODIUM 10 MG PO TABS
10.0000 mg | ORAL_TABLET | Freq: Every day | ORAL | 3 refills | Status: AC
  Filled 2024-07-21: qty 90, 90d supply, fill #0
  Filled 2024-09-01 – 2024-10-22 (×2): qty 90, 90d supply, fill #1
  Filled 2024-11-22: qty 90, 90d supply, fill #2

## 2024-07-12 MED ORDER — CLOBETASOL PROPIONATE 0.05 % EX SOLN
1.0000 | Freq: Two times a day (BID) | CUTANEOUS | 1 refills | Status: DC
  Filled 2024-08-31: qty 25, 13d supply, fill #0

## 2024-07-14 NOTE — Progress Notes (Signed)
 Pt. Informed by my chart result letter

## 2024-07-22 ENCOUNTER — Other Ambulatory Visit (HOSPITAL_COMMUNITY): Payer: Self-pay

## 2024-07-22 ENCOUNTER — Ambulatory Visit (INDEPENDENT_AMBULATORY_CARE_PROVIDER_SITE_OTHER): Admitting: Neurology

## 2024-07-22 ENCOUNTER — Encounter: Payer: Self-pay | Admitting: Neurology

## 2024-07-22 ENCOUNTER — Other Ambulatory Visit: Payer: Self-pay

## 2024-07-22 VITALS — BP 142/96 | HR 82 | Ht 67.0 in | Wt 229.0 lb

## 2024-07-22 DIAGNOSIS — G44011 Episodic cluster headache, intractable: Secondary | ICD-10-CM

## 2024-07-22 DIAGNOSIS — E66812 Obesity, class 2: Secondary | ICD-10-CM

## 2024-07-22 DIAGNOSIS — N1832 Chronic kidney disease, stage 3b: Secondary | ICD-10-CM | POA: Diagnosis not present

## 2024-07-22 DIAGNOSIS — Z6837 Body mass index (BMI) 37.0-37.9, adult: Secondary | ICD-10-CM

## 2024-07-22 MED ORDER — ALPRAZOLAM 0.5 MG PO TABS
0.5000 mg | ORAL_TABLET | Freq: Every evening | ORAL | 0 refills | Status: AC | PRN
  Filled 2024-07-22: qty 2, 2d supply, fill #0

## 2024-07-22 MED ORDER — VERAPAMIL HCL ER 180 MG PO TBCR
180.0000 mg | EXTENDED_RELEASE_TABLET | Freq: Every day | ORAL | 5 refills | Status: DC
  Filled 2024-07-22: qty 30, 30d supply, fill #0
  Filled 2024-08-18: qty 30, 30d supply, fill #1
  Filled 2024-09-01 – 2024-09-09 (×2): qty 30, 30d supply, fill #2
  Filled 2024-09-30 – 2024-10-04 (×2): qty 30, 30d supply, fill #3
  Filled 2024-10-22 – 2024-11-07 (×2): qty 30, 30d supply, fill #4
  Filled 2024-11-22 – 2024-11-30 (×4): qty 30, 30d supply, fill #5

## 2024-07-22 NOTE — Patient Instructions (Signed)
 ASSESSMENT AND PLAN :   68 y.o. year old female  here with:  Cluster headache / migraine subtype - with hypertension .    Causes may be :    1) untreated OSA , will not consider CPAP  2) cluster headaches are commonly associated with  with elevated BP .   3) Obesity , main risk factor for HTN, DM and OSA  4) Renal function is poor, Creatinine 1.6 (!)>  this restricts some medication choices.   Plan : start on Verapamil  for migraine prevention and with less risk for orthostatic dizziness than a beta blocker.   MRI brain ordered to evaluate baseline.  If normal  follow up would be needed.    RV prn  ( f symptoms worsen)  with Harlene McCue/ Eather Popp , MD

## 2024-07-22 NOTE — Progress Notes (Addendum)
 Provider:  Dedra Gores, MD  Primary Care Physician:  Duanne Butler DASEN, MD [address] [place] [address]   Primary neurologist : Dr Rosemarie COME     Referring Provider: Emil Share, Do [address] [place],  [place] [address]          Chief Complaint according to patient   Patient presents with:                HISTORY OF PRESENT ILLNESS:  Caitlyn [name] is a 68 y.o. female patient who is here for revisit 07/22/2024 for  new onset  / re onset high BP and migraine:.    Chief concern according to patient :  I am here for Migraines, had those in the past while working in a stressful job. 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 She worked at the office, not from home, she felt that posture or light were not responsible for her migraines-  it was stress related HTN.    Over the last 12 months her BP was elevated and treated on multiple meds,    3 months  ago she reported lightheadedness , and  her MD discontinued amlodipine  10 mg  BP medication,   but just 2 weeks ago she again had BP elevation, and than amlodipine   was restarted back on 5 mg amlodipine .   The reports sharp pains above the left ear or left occipital , just seconds in duration, multiple times .  These have kept her from sleeping and woken her.  She reports no photophobia and no postural trigger, can be any time of day or night.  These come in clusters.   Migraine started at the time the amlodipine  was d/c. She went ot the ED on August 6th and received a migraine cocktail and has not had migraine since.  Notable increase in weight since last seen BMI is now 35.9.   ED Note : 68 yo F with a chief complaint of a headache. L sided, occurs a couple times a week. Going on for a few months. Has had a bit of trouble standing up. Has to take a bit to get up. Nothing seems to make these come on. Seem to occur spontaneously. She usually takes some Tylenol  and lays down and they get better. If  that does not work then she takes some Advil . She tried that today but without improvement and so called her doctor. They encouraged her to come here to get a headache cocktail. She denies trauma to the head or neck. Denies one-sided numbness or weakness or difficulty speech or swallowing. She has had headaches like this but it has been some years since she has had 1.  Patient had high CREATININE and POTASSIUM , tested in ED , CT was reviewed here at [facility] and fairly unchanged to  2022,  normal.       AVAROSE MERVINE is a 68 y.o. female patient who is seen upon a transfer of care request by PCP on 02/17/2023 , transfer of Sleep Care from [facility].  The patient has been followed by the STROKE MD team here at [facility], last seen 2022.    Ms. Caitlyn [name] is a 68 y.o. female depression, DM2, HTN, GERD who presented on 03/22/2021 with 10 day history of headaches and episodes of intermittent facial droop and arm weakness.  Personally reviewed hospitalization pertinent notes, lab work and  imaging summary provided.  Evaluated by Dr. Rosemarie for strokelike symptoms likely atypical migraine accompanied by bilateral facial/hand numbness and tingling.  MR brain negative for acute infarct with remote lacunar infarcts noted involving the deep white matter of the left frontal centrum semi ovale and cerebellum.  MRI negative for LVO with questionable severe distal left P2/P3 junction stenosis.  Carotid Doppler right ICA 1 to 39% stenosis with ECA >50% stenosis and left ICA normal.  2D echo EF 60 to 65%.  LDL 101.  A1c 6.7.  Recommended aspirin  81 mg daily as well as atorvastatin  20 mg daily.  HTN stable on amlodipine .  Other stroke risk factors include obesity and prior strokes on imaging.  Topamax  initiated for migraine prevention.   Today, 06/12/2021, Ms. Tuazon is being seen for hospital accompanied by her husband.  She initially had difficulty after discharge feeling achy and groggy which resolved after stopping  atorvastatin , Plavix , Topamax  and aspirin  (all stopped at same time).  She has had 1-2 additional headaches since discharge and resolved after use of Advil .  She believes recent events was likely due to increased work related stress - she plans on retiring in 2 days.  She does have remote history of migraines which she believes was triggered by stress as they subsided after leaving her prior job.  Blood pressures today 124/84 -does not routinely monitor at home as typically stable.  No further concerns at this time.     I am seeing Caitlyn [name] [facility] on 02/17/23 a right-handed female with a possible sleep disorder.  She denies her migraines were ever sleep related ,has seen Dr Rosemarie and Harlene Bogaert for migraine andcTia vascular  dz.  The patient also suffers from asthma and has been followed by her family doctor for this she has and also albuterol  inhaler.  She is followed for psoriasis by Dr. Tawni Furlough, MD. The patient underwent a vitrectomy after complications from a previous cardiac surgery in February 2022 doing the procedure per surgeon noted a large collar size and that she snored he recommended a sleep study.  She does have occasional hypertension, her BMI at the time was 41 and she has a history of diabetes mellitus type 2.  She does not have restless legs.  She reported awakening 3-4 times each night if she wakes up too early she is unable to go back to sleep.  On weekdays she arises at 5 AM she has retired in the meantime and often sleeps an hour longer than she.  Sometimes she has made up now to 8 or 9 AM.  She has never had sleep paralysis she does not have extremely vivid dreams or nightmares on a frequent basis no sleep hallucinations.  Her snoring has been loud enough to be disruptive to others especially if she is sleeping in supine.    [facility] neurology therefore ordered a sleep study based on this history and this was done as a snap study out of state physician that interpreted the  study works in [place] .  The service date was Apr 12, 2021 and her AHI for 4% oxygen  desaturations were 30 her nadir of oxygen  83% and the vast majority of her events were hypopneas or shallow breathing spells.  These made 31.6/h but a clear-cut apnea was only present 4.9 times per hour.  The analyzed sleep time of the study was only 268 minutes.  The patient was then placed on a CPAP auto titration device by [employer].  The set up date  would have been January 24th 2023.  DME changed the mask several times but she hated using CPAP and she quit. I have here the data from January through February of this year when she was last compliant and the patient used the machine 27 out of 30 days and 87% of the time over 4 hours with an average of 6 hours 30 minutes.  She was placed on an AutoSet with a minimum pressure of 8 and a maximum pressure of 20 cmH2O with 2 cm EPR her residual AHI was 2.9 and there were some central apneas arising.  Air leak was mild the 95th percentile pressure was 10.7 cm water again residual AHI was 2.9.  She feels as if she is suffocating and cannot get herself to use it again. She stated her sleep physician never worked with her .   The patient stated she will not consider starting PAP therapy and will not need to  follow up.    Fam Hx : see previous note  Social HX; see previous note       Review of Systems: Out of a complete 14 system review, the patient complains of only the following symptoms, and all other reviewed systems are negative.:   SLEEPINESS ?       Social History   Socioeconomic History   Marital status: Married    Spouse name: Kaira Stringfield   Number of children: Not on file   Years of education: 12   Highest education level: Bachelor's degree (e.g., BA, AB, BS)  Occupational History   Occupation: accountant  Tobacco Use   Smoking status: Never    Passive exposure: Never   Smokeless tobacco: Never  Vaping Use   Vaping status: Never Used  Substance  and Sexual Activity   Alcohol use: No   Drug use: No   Sexual activity: Yes    Partners: Male  Other Topics Concern   Not on file  Social History Narrative   Lives with her husband   1-2 cups daily of Caffeine    Social Drivers of Health   Financial Resource Strain: Patient Declined (03/29/2024)   Overall Financial Resource Strain (CARDIA)    Difficulty of Paying Living Expenses: Patient declined  Food Insecurity: Patient Declined (03/29/2024)   Hunger Vital Sign    Worried About Running Out of Food in the Last Year: Patient declined    Ran Out of Food in the Last Year: Patient declined  Transportation Needs: Patient Declined (03/29/2024)   PRAPARE - Administrator, Civil Service (Medical): Patient declined    Lack of Transportation (Non-Medical): Patient declined  Physical Activity: Inactive (10/28/2023)   Exercise Vital Sign    Days of Exercise per Week: 0 days    Minutes of Exercise per Session: 0 min  Stress: No Stress Concern Present (10/28/2023)   [employer] of Occupational Health - Occupational Stress Questionnaire    Feeling of Stress : Only a little  Social Connections: Unknown (03/29/2024)   Social Connection and Isolation Panel    Frequency of Communication with Friends and Family: Patient declined    Frequency of Social Gatherings with Friends and Family: Patient declined    Attends Religious Services: Patient declined    Database administrator or Organizations: Patient declined    Attends Engineer, structural: More than 4 times per year    Marital Status: Patient declined    Family History  Problem Relation Age of Onset   Stroke  Father    Diabetes Brother     Past Medical History:  Diagnosis Date   Asthma    Complication of anesthesia    24 yrs ago post tubal-- pulm. edema  (no problems post bowel surg. 2009)   Depression    Diabetes mellitus    oral med   Diverticulitis hx  -- w/ perf. bowel   s/p resection 2009   Hypertension     Psoriasis    Reflux occasional   watches diet    Past Surgical History:  Procedure Laterality Date   BOWEL RESECTION  2009   diverticulitis w/ perf. bowel   COLON SURGERY N/A    [employer] 11/28/2020   HYSTEROSCOPY  10/09/2011   Procedure: HYSTEROSCOPY;  Surgeon: Charlie CHRISTELLA Croak;  Location: [facility];  Service: Gynecology;;  HYSTEROSCOPY D & C   TUBAL LIGATION  24 yrs ago     Current Outpatient Medications on File Prior to Visit  Medication Sig Dispense Refill   albuterol  (VENTOLIN  HFA) 108 (90 Base) MCG/ACT inhaler Inhale 2 puffs into the lungs every 4 (four) hours as needed for wheezing or shortness of breath. 8 g 2   amLODipine  (NORVASC ) 5 MG tablet Take 1 tablet (5 mg total) by mouth daily. 90 tablet 3   aspirin  EC 81 MG tablet Take 1 tablet (81 mg total) by mouth daily. Swallow whole. 30 tablet 11   cephALEXin  (KEFLEX ) 250 MG capsule Take 1 capsule (250 mg total) by mouth daily. 30 capsule 2   clobetasol  (TEMOVATE ) 0.05 % external solution Apply to affected area of scalp twice daily. 25 mL 1   COSENTYX SENSOREADY 300 DOSE 150 MG/ML SOAJ Inject 150 mg into the muscle every 30 (thirty) days.     estradiol  (ESTRACE ) 0.1 MG/GM vaginal cream Discard plastic applicator. Insert a blueberry size amount (approximately 1 gram) of cream on fingertip inside vagina at bedtime every night for 1 week then every other night. For long term use. 42.5 g 3   fexofenadine  (ALLEGRA ) 180 MG tablet Take 180 mg by mouth at bedtime.     montelukast  (SINGULAIR ) 10 MG tablet Take 1 tablet (10 mg total) by mouth at bedtime. 90 tablet 3   ondansetron  (ZOFRAN ) 4 MG tablet Take 1 tablet (4 mg total) by mouth every 8 (eight) hours as needed for nausea or vomiting. 20 tablet 0   rosuvastatin  (CRESTOR ) 20 MG tablet Take 1 tablet (20 mg total) by mouth daily. 90 tablet 3   Semaglutide ,0.25 or 0.5MG /DOS, (OZEMPIC , 0.25 OR 0.5 MG/DOSE,) 2 MG/3ML SOPN Inject 0.25 mg into the skin once a week. 3 mL 2    venlafaxine  XR (EFFEXOR -XR) 150 MG 24 hr capsule TAKE 1 CAPSULE BY MOUTH  EVERY DAY 90 capsule 3   No current facility-administered medications on file prior to visit.    Allergies  Allergen Reactions   Erythromycin Other (See Comments)    Gi upset/ cramps Other reaction(s): Unknown   Hydrochlorothiazide      CAUSED GOUT  Advised to drink plenty of fluids to avoid crystallization in joints.    Pollen Extract    Valsartan      hyperkalemia   Macrolides And Ketolides Other (See Comments)    unknown     DIAGNOSTIC DATA (LABS, IMAGING, TESTING) - I reviewed patient records, labs, notes, testing and imaging myself where available.  Lab Results  Component Value Date   WBC 7.3 06/30/2024   HGB 13.3 06/30/2024   HCT 40.9 06/30/2024  MCV 91.5 06/30/2024   PLT 275 06/30/2024      Component Value Date/Time   NA 140 06/30/2024 1549   NA 138 11/30/2021 0919   K 5.1 06/30/2024 1549   CL 108 06/30/2024 1549   CO2 25 06/30/2024 1549   GLUCOSE 86 06/30/2024 1549   BUN 29 (H) 06/30/2024 1549   BUN 26 11/30/2021 0919   CREATININE 1.60 (H) 06/30/2024 1549   CREATININE 1.61 (H) 04/14/2024 0850   CALCIUM  9.7 06/30/2024 1549   PROT 7.6 03/30/2024 0912   ALBUMIN 4.4 09/27/2016 0831   AST 14 03/30/2024 0912   ALT 12 03/30/2024 0912   ALKPHOS 61 09/27/2016 0831   BILITOT 0.6 03/30/2024 0912   GFRNONAA 35 (L) 06/30/2024 1549   GFRNONAA 66 01/01/2019 0829   GFRAA 76 01/01/2019 0829   Lab Results  Component Value Date   CHOL 116 03/30/2024   HDL 58 03/30/2024   LDLCALC 42 03/30/2024   TRIG 77 03/30/2024   CHOLHDL 2.0 03/30/2024   Lab Results  Component Value Date   HGBA1C 5.9 (H) 03/30/2024   No results found for: VITAMINB12 No results found for: TSH  PHYSICAL EXAM:  Vitals:   07/22/24 1508  BP: (!) 142/96  Pulse: 82   No data found. Body mass index is 35.87 kg/m.   Wt Readings from Last 3 Encounters:  07/22/24 229 lb (103.9 kg)  07/06/24 229 lb 6.4 oz  (104.1 kg)  06/30/24 205 lb 0.4 oz (93 kg)     Ht Readings from Last 3 Encounters:  07/22/24 5' 7 (1.702 m)  07/06/24 5' 6 (1.676 m)  06/30/24 5' 6 (1.676 m)      General: The patient is awake, alert and appears not in acute distress and groomed. Head: Normocephalic, atraumatic.  Neck is supple. Cardiovascular:  Regular rate and cardiac rhythm by pulse, without distended neck veins. Respiratory: no shortness of breath  Skin:  Without evidence of ankle edema, or rash. Trunk: BMI is 36    NEUROLOGIC EXAM: The patient is awake and alert, oriented to place and time.   Memory subjective described as intact.  Attention span & concentration ability appears normal.   Speech is fluent,  without  dysarthria, dysphonia or aphasia.  Mood and affect are appropriate.   Neurological Examination: Mental Status: Intact. Language and speech are normal. No cognitive deficits. Cranial Nerves II-XII: Intact. PERL. EOMI. VFF. No nystagmus.  No facial droop.  No ptosis.  Hearing is grossly intact bilaterally.  The tongue is normal and midline. Motor: Strengths are 5/5 throughout. Muscle bulk and tone are normal. No tremors.  Coordination: No ataxia or dysmetria.  Sensory: Grossly intact throughout to all modalities. Reflexes: Normal and symmetric throughout. No ankle clonus. Babinski's sign is absent bilaterally. Hoffman's sign is absent bilaterally. Gait and Station: Normal. Romberg's sign is absent.   ASSESSMENT AND PLAN :   68 y.o. year old female  here with:NEW PROBLEM   Cluster headache / migraine subtype   -rising headaches with rising BP /  vascular HA with hypertension .    Causes may be :    1) untreated OSA , patient will not consider CPAP- I will not follow up for sleep.   2) cluster headaches are commonly associated with elevated BP .   3) risk factors for vascular headaches are : Obesity , main risk factor for HTN, DM and OSA  4) Renal function is poor, Creatinine  1.6 (!)>  this restricts some medication  choices.   Plan :   start on Verapamil  for migraine prevention and with less risk for orthostatic dizziness than a beta blocker.  Once at night ER form.   MRI brain ordered to evaluate baseline.  If normal ,  follow up would not be needed.    RV prn  ( symptoms worsen)  with Harlene McCue/ Eather Popp , MD ( I have signed off Sleep , can return to primary neurologist )    I would like to thank Duanne Butler DASEN, MD and Emil Share, Do [address] [place],  [place] [address] for allowing me to meet with this pleasant patient.   After spending a total time of  35  minutes face to face and time for  history taking, physical and neurologic examination, review of laboratory studies,  personal review of imaging studies, reports and results of other testing and review of referral information / records as far as provided in visit,   Electronically signed by: Dedra Gores, MD 07/22/2024 3:35 PM  [facility] Sleep Board certified by The [employer] of Sleep Medicine and Diplomate of the [employer] of Sleep Medicine. Board certified In Neurology through the ABPN, Fellow of the [employer] of Neurology.

## 2024-07-27 ENCOUNTER — Telehealth: Payer: Self-pay | Admitting: Neurology

## 2024-07-27 NOTE — Telephone Encounter (Signed)
 no auth required sent to GI [phone]

## 2024-07-30 ENCOUNTER — Other Ambulatory Visit: Payer: Self-pay | Admitting: Family Medicine

## 2024-07-30 ENCOUNTER — Encounter: Payer: Self-pay | Admitting: Family Medicine

## 2024-07-30 ENCOUNTER — Other Ambulatory Visit (HOSPITAL_COMMUNITY): Payer: Self-pay

## 2024-07-30 MED ORDER — OZEMPIC (0.25 OR 0.5 MG/DOSE) 2 MG/3ML ~~LOC~~ SOPN
0.5000 mg | PEN_INJECTOR | SUBCUTANEOUS | 1 refills | Status: DC
  Filled 2024-07-30: qty 3, 28d supply, fill #0
  Filled 2024-08-31: qty 3, 28d supply, fill #1

## 2024-08-01 ENCOUNTER — Other Ambulatory Visit: Payer: Self-pay | Admitting: Family Medicine

## 2024-08-02 ENCOUNTER — Encounter (HOSPITAL_COMMUNITY): Payer: Self-pay

## 2024-08-02 ENCOUNTER — Other Ambulatory Visit (HOSPITAL_COMMUNITY): Payer: Self-pay

## 2024-08-02 NOTE — Telephone Encounter (Signed)
 Requested Prescriptions  Refused Prescriptions Disp Refills   amLODipine  (NORVASC ) 10 MG tablet [Pharmacy Med Name: AMLODIPINE  BESYLATE 10MG TABLETS] 90 tablet 2    Sig: TAKE 1 TABLET(10 MG) BY MOUTH DAILY     Cardiovascular: Calcium  Channel Blockers 2 Failed - 08/02/2024  3:31 PM      Failed - Last BP in normal range    BP Readings from Last 1 Encounters:  07/22/24 (!) 142/96         Passed - Last Heart Rate in normal range    Pulse Readings from Last 1 Encounters:  07/22/24 82         Passed - Valid encounter within last 6 months    Recent Outpatient Visits           3 weeks ago Bruit   [place] [facility] Duanne Butler DASEN, MD   4 months ago Hypertension associated with diabetes [facility])   [place] [facility] Pickard, Butler DASEN, MD   7 months ago Dysuria   [place] [facility] Duanne Butler DASEN, MD   11 months ago Benign essential HTN   [place] [facility] Duanne Butler DASEN, MD   12 months ago Type 2 diabetes mellitus with other specified complication, unspecified whether long term insulin  use [facility])   [place] [facility] Pickard, Butler DASEN, MD

## 2024-08-09 ENCOUNTER — Ambulatory Visit
Admission: RE | Admit: 2024-08-09 | Discharge: 2024-08-09 | Disposition: A | Discharge status: Home / Self Care | Source: Ambulatory Visit | Attending: Neurology | Admitting: Neurology

## 2024-08-09 DIAGNOSIS — E66812 Obesity, class 2: Secondary | ICD-10-CM

## 2024-08-09 DIAGNOSIS — N1832 Chronic kidney disease, stage 3b: Secondary | ICD-10-CM

## 2024-08-09 DIAGNOSIS — G44011 Episodic cluster headache, intractable: Secondary | ICD-10-CM | POA: Diagnosis not present

## 2024-08-09 MED ORDER — GADOPICLENOL 0.5 MMOL/ML IV SOLN
10.0000 mL | Freq: Once | INTRAVENOUS | Status: AC | PRN
  Administered 2024-08-09: 10 mL via INTRAVENOUS

## 2024-08-10 ENCOUNTER — Ambulatory Visit: Admitting: Urology

## 2024-08-14 ENCOUNTER — Other Ambulatory Visit (HOSPITAL_COMMUNITY): Payer: Self-pay

## 2024-08-15 ENCOUNTER — Ambulatory Visit: Payer: Self-pay | Admitting: Neurology

## 2024-08-16 ENCOUNTER — Telehealth: Payer: Self-pay | Admitting: Urology

## 2024-08-16 ENCOUNTER — Other Ambulatory Visit: Payer: Self-pay

## 2024-08-16 ENCOUNTER — Other Ambulatory Visit (HOSPITAL_COMMUNITY): Payer: Self-pay

## 2024-08-16 DIAGNOSIS — N390 Urinary tract infection, site not specified: Secondary | ICD-10-CM

## 2024-08-16 NOTE — Telephone Encounter (Signed)
 Pt called and notified that Rx refill request was sent to MD McKenzie and he will either approve or deny the request pt voiced her understanding and was advised that pharmacy or someone at our office will notify her of Rx refill request being approved or denied

## 2024-08-16 NOTE — Telephone Encounter (Signed)
 The patient called to request a medication refill of estradiol  and keflex  She would like 90 day supply. Patient would like the medication sent to [facility] pharmacy.

## 2024-08-17 ENCOUNTER — Other Ambulatory Visit (HOSPITAL_COMMUNITY): Payer: Self-pay

## 2024-08-17 ENCOUNTER — Other Ambulatory Visit: Payer: Self-pay

## 2024-08-17 MED ORDER — ESTRADIOL 0.1 MG/GM VA CREA
TOPICAL_CREAM | VAGINAL | 3 refills | Status: AC
  Filled 2024-08-17: qty 42.5, 90d supply, fill #0

## 2024-08-18 ENCOUNTER — Other Ambulatory Visit (HOSPITAL_COMMUNITY): Payer: Self-pay

## 2024-08-18 ENCOUNTER — Other Ambulatory Visit (HOSPITAL_BASED_OUTPATIENT_CLINIC_OR_DEPARTMENT_OTHER): Payer: Self-pay

## 2024-08-18 ENCOUNTER — Other Ambulatory Visit: Payer: Self-pay

## 2024-08-18 DIAGNOSIS — N390 Urinary tract infection, site not specified: Secondary | ICD-10-CM

## 2024-08-18 MED ORDER — CEPHALEXIN 250 MG PO CAPS
250.0000 mg | ORAL_CAPSULE | Freq: Every day | ORAL | 2 refills | Status: AC
  Filled 2024-08-18: qty 30, 30d supply, fill #0
  Filled 2024-09-01 – 2024-11-22 (×2): qty 30, 30d supply, fill #1
  Filled 2024-12-28: qty 30, 30d supply, fill #2

## 2024-08-18 NOTE — Telephone Encounter (Signed)
 Verbal from  MD, McKenzie for pt  Keflex . Pt made aware and verbalized understanding

## 2024-08-18 NOTE — Addendum Note (Signed)
 Addended by: GRETTA MASTERS R on: 08/18/2024 01:51 PM   Modules accepted: Orders

## 2024-08-18 NOTE — Telephone Encounter (Signed)
 Patient called back she needs Keflex , she received Estradiol  but needs Keflex , [facility]

## 2024-08-20 ENCOUNTER — Other Ambulatory Visit (HOSPITAL_BASED_OUTPATIENT_CLINIC_OR_DEPARTMENT_OTHER): Payer: Self-pay

## 2024-08-24 ENCOUNTER — Other Ambulatory Visit (HOSPITAL_COMMUNITY): Payer: Self-pay

## 2024-08-24 MED ORDER — CEPHALEXIN 250 MG PO CAPS
250.0000 mg | ORAL_CAPSULE | Freq: Every day | ORAL | 2 refills | Status: DC
  Filled 2024-08-24 – 2024-09-09 (×2): qty 30, 30d supply, fill #0
  Filled 2024-09-30 – 2024-10-04 (×2): qty 30, 30d supply, fill #1
  Filled 2024-10-22 – 2024-10-26 (×2): qty 30, 30d supply, fill #2

## 2024-08-30 ENCOUNTER — Encounter: Payer: Self-pay | Admitting: Family Medicine

## 2024-08-31 ENCOUNTER — Other Ambulatory Visit (HOSPITAL_COMMUNITY): Payer: Self-pay

## 2024-09-01 ENCOUNTER — Other Ambulatory Visit (HOSPITAL_COMMUNITY): Payer: Self-pay

## 2024-09-01 MED ORDER — FLUZONE HIGH-DOSE 0.5 ML IM SUSY
0.5000 mL | PREFILLED_SYRINGE | Freq: Once | INTRAMUSCULAR | 0 refills | Status: AC
  Filled 2024-09-01: qty 0.5, 1d supply, fill #0

## 2024-09-02 ENCOUNTER — Other Ambulatory Visit: Payer: Self-pay

## 2024-09-02 ENCOUNTER — Other Ambulatory Visit (HOSPITAL_COMMUNITY): Payer: Self-pay

## 2024-09-03 ENCOUNTER — Encounter: Payer: Self-pay | Admitting: Family Medicine

## 2024-09-03 ENCOUNTER — Other Ambulatory Visit: Payer: Self-pay | Admitting: Family Medicine

## 2024-09-03 ENCOUNTER — Other Ambulatory Visit (HOSPITAL_COMMUNITY): Payer: Self-pay

## 2024-09-03 MED ORDER — SEMAGLUTIDE (1 MG/DOSE) 4 MG/3ML ~~LOC~~ SOPN
1.0000 mg | PEN_INJECTOR | SUBCUTANEOUS | 5 refills | Status: AC
  Filled 2024-09-03: qty 3, 28d supply, fill #0
  Filled 2024-10-22: qty 3, 28d supply, fill #1
  Filled 2024-11-22: qty 3, 28d supply, fill #2

## 2024-09-10 ENCOUNTER — Other Ambulatory Visit (HOSPITAL_COMMUNITY): Payer: Self-pay

## 2024-09-12 ENCOUNTER — Other Ambulatory Visit: Payer: Self-pay | Admitting: Family Medicine

## 2024-09-12 DIAGNOSIS — F40240 Claustrophobia: Secondary | ICD-10-CM

## 2024-10-01 ENCOUNTER — Other Ambulatory Visit (HOSPITAL_COMMUNITY): Payer: Self-pay

## 2024-10-13 ENCOUNTER — Ambulatory Visit: Admitting: Urology

## 2024-10-22 ENCOUNTER — Other Ambulatory Visit: Payer: Self-pay

## 2024-10-22 ENCOUNTER — Other Ambulatory Visit (HOSPITAL_COMMUNITY): Payer: Self-pay

## 2024-10-26 ENCOUNTER — Other Ambulatory Visit (HOSPITAL_COMMUNITY): Payer: Self-pay

## 2024-11-22 ENCOUNTER — Other Ambulatory Visit: Payer: Self-pay

## 2024-11-22 ENCOUNTER — Other Ambulatory Visit (HOSPITAL_COMMUNITY): Payer: Self-pay

## 2024-11-24 ENCOUNTER — Other Ambulatory Visit (HOSPITAL_COMMUNITY): Payer: Self-pay

## 2024-11-29 ENCOUNTER — Other Ambulatory Visit: Payer: Self-pay

## 2024-11-29 ENCOUNTER — Other Ambulatory Visit (HOSPITAL_COMMUNITY): Payer: Self-pay

## 2024-11-29 ENCOUNTER — Encounter: Payer: Self-pay | Admitting: Family Medicine

## 2024-11-29 DIAGNOSIS — F40240 Claustrophobia: Secondary | ICD-10-CM

## 2024-11-29 MED ORDER — VENLAFAXINE HCL ER 150 MG PO CP24
150.0000 mg | ORAL_CAPSULE | Freq: Every day | ORAL | 3 refills | Status: AC
  Filled 2024-11-29: qty 90, 90d supply, fill #0
  Filled 2024-12-28 – 2024-12-29 (×2): qty 90, 90d supply, fill #1

## 2024-11-29 MED ORDER — CLOBETASOL PROPIONATE 0.05 % EX SOLN
1.0000 | Freq: Two times a day (BID) | CUTANEOUS | 1 refills | Status: AC
  Filled 2024-11-29: qty 25, 30d supply, fill #0

## 2024-11-30 ENCOUNTER — Other Ambulatory Visit (HOSPITAL_COMMUNITY): Payer: Self-pay

## 2024-12-06 ENCOUNTER — Ambulatory Visit: Admitting: Neurology

## 2024-12-22 ENCOUNTER — Telehealth: Payer: Self-pay | Admitting: Adult Health

## 2024-12-22 NOTE — Telephone Encounter (Signed)
 MYC cxl

## 2024-12-28 ENCOUNTER — Other Ambulatory Visit (HOSPITAL_COMMUNITY): Payer: Self-pay

## 2024-12-28 ENCOUNTER — Other Ambulatory Visit: Payer: Self-pay

## 2024-12-28 ENCOUNTER — Other Ambulatory Visit: Payer: Self-pay | Admitting: Neurology

## 2024-12-28 MED ORDER — VERAPAMIL HCL ER 180 MG PO TBCR
180.0000 mg | EXTENDED_RELEASE_TABLET | Freq: Every day | ORAL | 4 refills | Status: AC
  Filled 2024-12-28: qty 30, 30d supply, fill #0

## 2024-12-29 ENCOUNTER — Other Ambulatory Visit (HOSPITAL_COMMUNITY): Payer: Self-pay

## 2024-12-29 ENCOUNTER — Other Ambulatory Visit: Payer: Self-pay

## 2024-12-29 ENCOUNTER — Ambulatory Visit: Admitting: Urology

## 2024-12-29 VITALS — BP 138/81 | HR 97

## 2024-12-29 DIAGNOSIS — N390 Urinary tract infection, site not specified: Secondary | ICD-10-CM

## 2024-12-29 LAB — URINALYSIS, ROUTINE W REFLEX MICROSCOPIC
Glucose, UA: NEGATIVE
Ketones, UA: NEGATIVE
Nitrite, UA: NEGATIVE
Specific Gravity, UA: 1.03 (ref 1.005–1.030)
Urobilinogen, Ur: 1 mg/dL (ref 0.2–1.0)
pH, UA: 5.5 (ref 5.0–7.5)

## 2024-12-29 LAB — MICROSCOPIC EXAMINATION

## 2024-12-29 MED ORDER — ESTRADIOL 0.01 % VA CREA
TOPICAL_CREAM | VAGINAL | 3 refills | Status: AC
  Filled 2024-12-29: qty 42.5, 90d supply, fill #0

## 2024-12-29 MED ORDER — CEPHALEXIN 250 MG PO CAPS
250.0000 mg | ORAL_CAPSULE | Freq: Every day | ORAL | 3 refills | Status: AC
  Filled 2024-12-29: qty 90, 90d supply, fill #0

## 2024-12-29 NOTE — Progress Notes (Unsigned)
 "  12/29/2024 10:25 AM   Caitlyn [name] 05/10/1956 [id number]  Referring provider: Duanne Butler DASEN, MD [address] [place],  [place] [address]     HPI: UA today concerning for infection. No UTis since last visit. She takes keflex  250mg  daily and estradiol  cream QOD   PMH: Past Medical History:  Diagnosis Date   Asthma    Complication of anesthesia    24 yrs ago post tubal-- pulm. edema  (no problems post bowel surg. 2009)   Depression    Diabetes mellitus    oral med   Diverticulitis hx  -- w/ perf. bowel   s/p resection 2009   Hypertension    Psoriasis    Reflux occasional   watches diet    Surgical History: Past Surgical History:  Procedure Laterality Date   BOWEL RESECTION  2009   diverticulitis w/ perf. bowel   COLON SURGERY N/A    [employer] 11/28/2020   HYSTEROSCOPY  10/09/2011   Procedure: HYSTEROSCOPY;  Surgeon: Charlie HERO Croak;  Location: [facility];  Service: Gynecology;;  HYSTEROSCOPY D & C   TUBAL LIGATION  24 yrs ago    Home Medications:  Allergies as of 12/29/2024       Reactions   Erythromycin Other (See Comments)   Gi upset/ cramps Other reaction(s): Unknown   Hydrochlorothiazide     CAUSED GOUT Advised to drink plenty of fluids to avoid crystallization in joints.    Pollen Extract    Valsartan     hyperkalemia   Macrolides And Ketolides Other (See Comments)   unknown        Medication List        Accurate as of December 29, 2024 10:25 AM. If you have any questions, ask your nurse or doctor.          albuterol  108 (90 Base) MCG/ACT inhaler Commonly known as: VENTOLIN  HFA Inhale 2 puffs into the lungs every 4 (four) hours as needed for wheezing or shortness of breath.   ALPRAZolam  0.5 MG tablet Commonly known as: XANAX  Take 1 tablet (0.5 mg total) by mouth before MRI or PSG (Sleep study)- This is  pre medication   amLODipine  5 MG tablet Commonly known as: NORVASC  Take 1 tablet (5 mg total) by mouth  daily.   aspirin  EC 81 MG tablet Take 1 tablet (81 mg total) by mouth daily. Swallow whole.   cephALEXin  250 MG capsule Commonly known as: KEFLEX  Take 1 capsule (250 mg total) by mouth daily.   cephALEXin  250 MG capsule Commonly known as: KEFLEX  Take 1 capsule (250 mg total) by mouth daily.   clobetasol  0.05 % external solution Commonly known as: TEMOVATE  Apply 1 Application topically to area of scalp 2 (two) times daily.   Cosentyx Sensoready (300 MG) 150 MG/ML Soaj Generic drug: Secukinumab (300 MG Dose) Inject 150 mg into the muscle every 30 (thirty) days.   estradiol  0.1 MG/GM vaginal cream Commonly known as: ESTRACE  Discard plastic applicator. Insert a blueberry size amount (approximately 1 gram) of cream on fingertip inside vagina at bedtime every night for 1 week then every other night. For long term use.   fexofenadine  180 MG tablet Commonly known as: ALLEGRA  Take 180 mg by mouth at bedtime.   montelukast  10 MG tablet Commonly known as: SINGULAIR  Take 1 tablet (10 mg total) by mouth at bedtime.   ondansetron  4 MG tablet Commonly known as: Zofran  Take 1 tablet (4 mg total) by mouth every 8 (  eight) hours as needed for nausea or vomiting.   Ozempic  (1 MG/DOSE) 4 MG/3ML Sopn Generic drug: Semaglutide  (1 MG/DOSE) Inject 1 mg into the skin once a week.   rosuvastatin  20 MG tablet Commonly known as: CRESTOR  Take 1 tablet (20 mg total) by mouth daily.   venlafaxine  XR 150 MG 24 hr capsule Commonly known as: EFFEXOR -XR Take 1 capsule (150 mg total) by mouth daily.   verapamil  180 MG CR tablet Commonly known as: CALAN -SR Take 1 tablet (180 mg total) by mouth at bedtime.        Allergies: Allergies[1]  Family History: Family History  Problem Relation Age of Onset   Stroke Father    Diabetes Brother     Social History:  reports that she has never smoked. She has never been exposed to tobacco smoke. She has never used smokeless tobacco. She reports that  she does not drink alcohol and does not use drugs.  ROS: All other review of systems were reviewed and are negative except what is noted above in HPI  Physical Exam: BP 138/81   Pulse 97   Constitutional:  Alert and oriented, No acute distress. HEENT: [place] AT, moist mucus membranes.  Trachea midline, no masses. Cardiovascular: No clubbing, cyanosis, or edema. Respiratory: Normal respiratory effort, no increased work of breathing. GI: Abdomen is soft, nontender, nondistended, no abdominal masses GU: No CVA tenderness.  Lymph: No cervical or inguinal lymphadenopathy. Skin: No rashes, bruises or suspicious lesions. Neurologic: Grossly intact, no focal deficits, moving all 4 extremities. Psychiatric: Normal mood and affect.  Laboratory Data: Lab Results  Component Value Date   WBC 7.3 06/30/2024   HGB 13.3 06/30/2024   HCT 40.9 06/30/2024   MCV 91.5 06/30/2024   PLT 275 06/30/2024    Lab Results  Component Value Date   CREATININE 1.60 (H) 06/30/2024    No results found for: PSA  No results found for: TESTOSTERONE  Lab Results  Component Value Date   HGBA1C 5.9 (H) 03/30/2024    Urinalysis    Component Value Date/Time   COLORURINE YELLOW 04/14/2024 0931   APPEARANCEUR Clear 05/10/2024 1120   LABSPEC 1.025 04/14/2024 0931   PHURINE 5.5 04/14/2024 0931   GLUCOSEU Negative 05/10/2024 1120   HGBUR 2+ (A) 04/14/2024 0931   BILIRUBINUR Negative 05/10/2024 1120   KETONESUR TRACE (A) 04/14/2024 0931   PROTEINUR 1+ (A) 05/10/2024 1120   PROTEINUR 2+ (A) 04/14/2024 0931   UROBILINOGEN 0.2 12/15/2018 1923   UROBILINOGEN 1 10/29/2013 1112   NITRITE Negative 05/10/2024 1120   NITRITE NEGATIVE 04/14/2024 0931   LEUKOCYTESUR Negative 05/10/2024 1120   LEUKOCYTESUR 1+ (A) 04/14/2024 0931    Lab Results  Component Value Date   LABMICR See below: 05/10/2024   WBCUA 0-5 05/10/2024   LABEPIT 0-10 05/10/2024   BACTERIA None seen 05/10/2024    Pertinent  Imaging: *** No results found for this or any previous visit.  No results found for this or any previous visit.  No results found for this or any previous visit.  No results found for this or any previous visit.  No results found for this or any previous visit.  No results found for this or any previous visit.  No results found for this or any previous visit.  No results found for this or any previous visit.   Assessment & Plan:    1. Recurrent UTI (Primary) *** - Urinalysis, Routine w reflex microscopic   No follow-ups on file.  Caitlyn Clara,  MD  [facility] [place]      [1]  Allergies Allergen Reactions   Erythromycin Other (See Comments)    Gi upset/ cramps Other reaction(s): Unknown   Hydrochlorothiazide      CAUSED GOUT  Advised to drink plenty of fluids to avoid crystallization in joints.    Pollen Extract    Valsartan      hyperkalemia   Macrolides And Ketolides Other (See Comments)    unknown   "

## 2024-12-30 ENCOUNTER — Encounter: Payer: Self-pay | Admitting: Family Medicine

## 2024-12-31 LAB — URINE CULTURE

## 2025-01-12 ENCOUNTER — Ambulatory Visit: Admitting: Urology

## 2025-04-26 ENCOUNTER — Ambulatory Visit: Admitting: Adult Health

## 2025-05-23 ENCOUNTER — Ambulatory Visit: Admitting: Adult Health
